# Patient Record
Sex: Male | Born: 1965 | Race: White | Hispanic: No | Marital: Single | Smoking: Never smoker
Health system: Southern US, Community
[De-identification: ages and names within clinical notes are randomized; demographics above are authoritative.]

## PROBLEM LIST (undated history)

## (undated) DIAGNOSIS — F79 Unspecified intellectual disabilities: Secondary | ICD-10-CM

## (undated) DIAGNOSIS — K432 Incisional hernia without obstruction or gangrene: Secondary | ICD-10-CM

## (undated) DIAGNOSIS — T8859XA Other complications of anesthesia, initial encounter: Secondary | ICD-10-CM

## (undated) DIAGNOSIS — T4145XA Adverse effect of unspecified anesthetic, initial encounter: Secondary | ICD-10-CM

## (undated) DIAGNOSIS — G4730 Sleep apnea, unspecified: Secondary | ICD-10-CM

## (undated) DIAGNOSIS — Q909 Down syndrome, unspecified: Secondary | ICD-10-CM

## (undated) HISTORY — DX: Down syndrome, unspecified: Q90.9

## (undated) HISTORY — PX: HERNIA REPAIR: SHX51

## (undated) SURGERY — COLONOSCOPY
Anesthesia: Monitor Anesthesia Care

---

## 2013-12-08 ENCOUNTER — Ambulatory Visit (INDEPENDENT_AMBULATORY_CARE_PROVIDER_SITE_OTHER): Payer: Medicare Other | Admitting: Family Medicine

## 2013-12-08 ENCOUNTER — Ambulatory Visit: Payer: Medicare Other

## 2013-12-08 VITALS — BP 140/82 | HR 73 | Temp 97.9°F | Resp 18 | Ht 60.5 in | Wt 212.4 lb

## 2013-12-08 DIAGNOSIS — R1031 Right lower quadrant pain: Secondary | ICD-10-CM

## 2013-12-08 DIAGNOSIS — R1032 Left lower quadrant pain: Principal | ICD-10-CM

## 2013-12-08 DIAGNOSIS — R509 Fever, unspecified: Secondary | ICD-10-CM

## 2013-12-08 DIAGNOSIS — R197 Diarrhea, unspecified: Secondary | ICD-10-CM

## 2013-12-08 LAB — POCT URINALYSIS DIPSTICK
Glucose, UA: NEGATIVE
Leukocytes, UA: NEGATIVE
Nitrite, UA: NEGATIVE
Protein, UA: 30
Spec Grav, UA: 1.025
Urobilinogen, UA: 0.2
pH, UA: 6.5

## 2013-12-08 LAB — COMPREHENSIVE METABOLIC PANEL
ALT: 37 U/L (ref 0–53)
Alkaline Phosphatase: 71 U/L (ref 39–117)
BUN: 15 mg/dL (ref 6–23)
CO2: 26 mEq/L (ref 19–32)
Calcium: 8.8 mg/dL (ref 8.4–10.5)
Creat: 1.09 mg/dL (ref 0.50–1.35)
Glucose, Bld: 100 mg/dL — ABNORMAL HIGH (ref 70–99)
Sodium: 135 mEq/L (ref 135–145)
Total Bilirubin: 0.3 mg/dL (ref 0.2–1.2)
Total Protein: 7.2 g/dL (ref 6.0–8.3)

## 2013-12-08 LAB — COMPREHENSIVE METABOLIC PANEL WITH GFR
AST: 65 U/L — ABNORMAL HIGH (ref 0–37)
Albumin: 3.8 g/dL (ref 3.5–5.2)
Chloride: 99 meq/L (ref 96–112)
Potassium: 4.4 meq/L (ref 3.5–5.3)

## 2013-12-08 LAB — POCT CBC
Granulocyte percent: 78.6 %G (ref 37–80)
HCT, POC: 51.8 % (ref 43.5–53.7)
Hemoglobin: 16.4 g/dL (ref 14.1–18.1)
Lymph, poc: 1.1 (ref 0.6–3.4)
MCH, POC: 32.2 pg — AB (ref 27–31.2)
MCHC: 31.7 g/dL — AB (ref 31.8–35.4)
MCV: 101.7 fL — AB (ref 80–97)
MID (cbc): 5 — AB (ref 0–0.9)
MPV: 8.2 fL (ref 0–99.8)
POC Granulocyte: 5.7 (ref 2–6.9)
POC LYMPH PERCENT: 14.6 % (ref 10–50)
POC MID %: 6.8 % (ref 0–12)
Platelet Count, POC: 228 10*3/uL (ref 142–424)
RBC: 5.09 M/uL (ref 4.69–6.13)
RDW, POC: 14.6 %
WBC: 7.2 10*3/uL (ref 4.6–10.2)

## 2013-12-08 LAB — LIPASE: Lipase: 27 U/L (ref 0–75)

## 2013-12-08 LAB — POCT UA - MICROSCOPIC ONLY
Amorphous: POSITIVE
Casts, Ur, LPF, POC: NEGATIVE
Crystals, Ur, HPF, POC: NEGATIVE
Mucus, UA: NEGATIVE
Yeast, UA: NEGATIVE

## 2013-12-08 LAB — IFOBT (OCCULT BLOOD): IFOBT: POSITIVE

## 2013-12-08 LAB — POCT SEDIMENTATION RATE: POCT SED RATE: 79 mm/h — AB (ref 0–22)

## 2013-12-08 NOTE — Progress Notes (Signed)
Chief Complaint:  Chief Complaint  Patient presents with  . Fever  . Diarrhea    4 days- blood in stool and severe abdominal pain  . Abdominal Pain    HPI: Steven [name] is a 48 y.o. male who is here for abdominal pain for a day in a half. Has had diarrhea with blood and pain every 15 min but now extended out to 1-3 hours.  Has had fever 101.7 Saturday. No OTC medications taken at this time. Decreased appetite also.  He has had bloody runny stools, no actual stool, mom is here and states that when he goes to the bathroom he has runny loose stools but now just water NO new travels, foods, medicine or abx. She is an aromatherapist so they have not sued anything for him  No  family h/o  crohns or colitis No abd surgeries except for abdominal hernia He is soemwhat lethargic and per mom he is sleeping a lot more than his usual self  HE ate lasagna and fresh salad last night, has not eaten out at [employer]. Everyone else had same things and are not sick. HE has not been in contact with sick people with similar sxs.  NO new abx. Mom states they have bed bugs so he has a rash on his body, they are getting the exterminator to come out soon.  NO URI sxs No well water, uses filter  [employer].  Last episode of diarrhea with blood was this morning at 4 am , was less than normal  No  NSAIDs have been used   No past medical history on file. Past Surgical History  Procedure Laterality Date  . Hernia repair     History   Social History  . Marital Status: Single    Spouse Name: N/A    Number of Children: N/A  . Years of Education: N/A   Social History Main Topics  . Smoking status: Never Smoker   . Smokeless tobacco: Not on file  . Alcohol Use: No  . Drug Use: No  . Sexual Activity: Not on file   Other Topics Concern  . Not on file   Social History Narrative  . No narrative on file   No family history on file. Allergies not on file Prior to Admission medications   Not  on File     ROS: The patient denies  night sweats, unintentional weight loss, chest pain, palpitations, wheezing, dyspnea on exertion, nausea, vomiting, , dysuria, hematuria, melena, numbness, weakness, or tingling.   All other systems have been reviewed and were otherwise negative with the exception of those mentioned in the HPI and as above.    PHYSICAL EXAM: Filed Vitals:   12/08/13 1029  BP: 140/82  Pulse: 73  Temp: 97.9 F (36.6 C)  Resp: 18   Filed Vitals:   12/08/13 1029  Height: 5' 0.5" (1.537 m)  Weight: 212 lb 6.4 oz (96.344 kg)   Body mass index is 40.78 kg/(m^2).  General: Alert, no acute distress HEENT:  Normocephalic, atraumatic, oropharynx patent. EOMI, PERRLA Cardiovascular:  Regular rate and rhythm, no rubs murmurs or gallops.  No Carotid bruits, radial pulse intact. No pedal edema.  Respiratory: Clear to auscultation bilaterally.  No wheezes, rales, or rhonchi.  No cyanosis, no use of accessory musculature GI: No organomegaly, abdomen is soft and non-tender, positive bowel sounds.  No masses. Skin: No rashes. Neurologic: Facial musculature symmetric. Psychiatric: Patient is appropriate throughout our interaction.  Lymphatic: No cervical lymphadenopathy Musculoskeletal: Gait intact.   LABS: Results for orders placed in visit on 12/08/13  POCT CBC      Result Value Range   WBC 7.2  4.6 - 10.2 K/uL   Lymph, poc 1.1  0.6 - 3.4   POC LYMPH PERCENT 14.6  10 - 50 %L   MID (cbc) 5 (*) 0 - 0.9   POC MID % 6.8  0 - 12 %M   POC Granulocyte 5.7  2 - 6.9   Granulocyte percent 78.6  37 - 80 %G   RBC 5.09  4.69 - 6.13 M/uL   Hemoglobin 16.4  14.1 - 18.1 g/dL   HCT, POC 09.851.8  11.943.5 - 53.7 %   MCV 101.7 (*) 80 - 97 fL   MCH, POC 32.2 (*) 27 - 31.2 pg   MCHC 31.7 (*) 31.8 - 35.4 g/dL   RDW, POC 14.714.6     Platelet Count, POC 228  142 - 424 K/uL   MPV 8.2  0 - 99.8 fL  IFOBT (OCCULT BLOOD)      Result Value Range   IFOBT Positive    POCT SEDIMENTATION RATE       Result Value Range   POCT SED RATE 79 (*) 0 - 22 mm/hr  POCT UA - MICROSCOPIC ONLY      Result Value Range   WBC, Ur, HPF, POC 0-2     RBC, urine, microscopic 0-2     Bacteria, U Microscopic trace     Mucus, UA neg     Epithelial cells, urine per micros 0-2     Crystals, Ur, HPF, POC neg     Casts, Ur, LPF, POC neg     Yeast, UA neg     Amorphous pos    POCT URINALYSIS DIPSTICK      Result Value Range   Color, UA dark yellow     Clarity, UA clear     Glucose, UA neg     Bilirubin, UA small     Ketones, UA trace     Spec Grav, UA 1.025     Blood, UA small     pH, UA 6.5     Protein, UA 30     Urobilinogen, UA 0.2     Nitrite, UA neg     Leukocytes, UA Negative       EKG/XRAY:   [facility]. No acute cardiopulmonary process   ASSESSMENT/PLAN: Encounter Diagnoses  Name Primary?  . Abdominal pain, bilateral lower quadrant Yes  . Fever, unspecified   . Bloody diarrhea    ? Viral vs bacterial gastroenteritis Most likely viral with no leukocytosis He was given 2 bags IVF in office Stool cx , OP and C diff pending If he continues to have bloody stools will refer to  GI If he continues to do poorly or worsenign sxs go to ER F/u as needed                Gross sideeffects, risk and benefits, and alternatives of medications d/w patient. Patient is aware that all medications have potential sideeffects and we are unable to predict every sideeffect or drug-drug interaction that may occur.  Rockne CoonsLE, THAO PHUONG, DO 12/08/2013 4:29 PM   12/09/13--Spoke wit mom in the AM and they had gone to ED , gave fluids, nothing else, was wondering about PO toradol that was given to him. I suggested that he can try  low dose tylenol since he is having bloody diarrhea the toradol may increase that risk, but just low dose if he has a lot of pain. AST was slightly elevated but I don't think this will be increased risk of liver dysfunction. Stool sample submitted earlier  today. She willc all me if anything changes   12/11/12--LM about prelim labs for stool.

## 2013-12-08 NOTE — Patient Instructions (Signed)
Diet for Diarrhea, Adult Frequent, runny stools (diarrhea) may be caused or worsened by food or drink. Diarrhea may be relieved by changing your diet. Since diarrhea can last up to 7 days, it is easy for you to lose too much fluid from the body and become dehydrated. Fluids that are lost need to be replaced. Along with a modified diet, make sure you drink enough fluids to keep your urine clear or pale yellow. DIET INSTRUCTIONS  Ensure adequate fluid intake (hydration): have 1 cup (8 oz) of fluid for each diarrhea episode. Avoid fluids that contain simple sugars or sports drinks, fruit juices, whole milk products, and sodas. Your urine should be clear or pale yellow if you are drinking enough fluids. Hydrate with an oral rehydration solution that you can purchase at pharmacies, retail stores, and online. You can prepare an oral rehydration solution at home by mixing the following ingredients together:    tsp table salt.   tsp baking soda.   tsp salt substitute containing potassium chloride.  1  tablespoons sugar.  1 L (34 oz) of water.  Certain foods and beverages may increase the speed at which food moves through the gastrointestinal (GI) tract. These foods and beverages should be avoided and include:  Caffeinated and alcoholic beverages.  High-fiber foods, such as raw fruits and vegetables, nuts, seeds, and whole grain breads and cereals.  Foods and beverages sweetened with sugar alcohols, such as xylitol, sorbitol, and mannitol.  Some foods may be well tolerated and may help thicken stool including:  Starchy foods, such as rice, toast, pasta, low-sugar cereal, oatmeal, grits, baked potatoes, crackers, and bagels.   Bananas.   Applesauce.  Add probiotic-rich foods to help increase healthy bacteria in the GI tract, such as yogurt and fermented milk products. RECOMMENDED FOODS AND BEVERAGES Starches Choose foods with less than 2 g of fiber per serving.  Recommended:  White,  French, and pita breads, plain rolls, buns, bagels. Plain muffins, matzo. Soda, saltine, or graham crackers. Pretzels, melba toast, zwieback. Cooked cereals made with water: cornmeal, farina, cream cereals. Dry cereals: refined corn, wheat, rice. Potatoes prepared any way without skins, refined macaroni, spaghetti, noodles, refined rice.  Avoid:  Bread, rolls, or crackers made with whole wheat, multi-grains, rye, bran seeds, nuts, or coconut. Corn tortillas or taco shells. Cereals containing whole grains, multi-grains, bran, coconut, nuts, raisins. Cooked or dry oatmeal. Coarse wheat cereals, granola. Cereals advertised as "high-fiber." Potato skins. Whole grain pasta, wild or brown rice. Popcorn. Sweet potatoes, yams. Sweet rolls, doughnuts, waffles, pancakes, sweet breads. Vegetables  Recommended: Strained tomato and vegetable juices. Most well-cooked and canned vegetables without seeds. Fresh: Tender lettuce, cucumber without the skin, cabbage, spinach, bean sprouts.  Avoid: Fresh, cooked, or canned: Artichokes, baked beans, beet greens, broccoli, Brussels sprouts, corn, kale, legumes, peas, sweet potatoes. Cooked: Green or red cabbage, spinach. Avoid large servings of any vegetables because vegetables shrink when cooked, and they contain more fiber per serving than fresh vegetables. Fruit  Recommended: Cooked or canned: Apricots, applesauce, cantaloupe, cherries, fruit cocktail, grapefruit, grapes, kiwi, mandarin oranges, peaches, pears, plums, watermelon. Fresh: Apples without skin, ripe banana, grapes, cantaloupe, cherries, grapefruit, peaches, oranges, plums. Keep servings limited to  cup or 1 piece.  Avoid: Fresh: Apples with skin, apricots, mangoes, pears, raspberries, strawberries. Prune juice, stewed or dried prunes. Dried fruits, raisins, dates. Large servings of all fresh fruits. Protein  Recommended: Ground or well-cooked tender beef, ham, veal, lamb, pork, or poultry. Eggs. Fish,  oysters, shrimp,   lobster, other seafoods. Liver, organ meats.  Avoid: Tough, fibrous meats with gristle. Peanut butter, smooth or chunky. Cheese, nuts, seeds, legumes, dried peas, beans, lentils. Dairy  Recommended: Yogurt, lactose-free milk, kefir, drinkable yogurt, buttermilk, soy milk, or plain hard cheese.  Avoid: Milk, chocolate milk, beverages made with milk, such as milkshakes. Soups  Recommended: Bouillon, broth, or soups made from allowed foods. Any strained soup.  Avoid: Soups made from vegetables that are not allowed, cream or milk-based soups. Desserts and Sweets  Recommended: Sugar-free gelatin, sugar-free frozen ice pops made without sugar alcohol.  Avoid: Plain cakes and cookies, pie made with fruit, pudding, custard, cream pie. Gelatin, fruit, ice, sherbet, frozen ice pops. Ice cream, ice milk without nuts. Plain hard candy, honey, jelly, molasses, syrup, sugar, chocolate syrup, gumdrops, marshmallows. Fats and Oils  Recommended: Limit fats to less than 8 tsp per day.  Avoid: Seeds, nuts, olives, avocados. Margarine, butter, cream, mayonnaise, salad oils, plain salad dressings. Plain gravy, crisp bacon without rind. Beverages  Recommended: Water, decaffeinated teas, oral rehydration solutions, sugar-free beverages not sweetened with sugar alcohols.  Avoid: Fruit juices, caffeinated beverages (coffee, tea, soda), alcohol, sports drinks, or lemon-lime soda. Condiments  Recommended: Ketchup, mustard, horseradish, vinegar, cocoa powder. Spices in moderation: allspice, basil, bay leaves, celery powder or leaves, cinnamon, cumin powder, curry powder, ginger, mace, marjoram, onion or garlic powder, oregano, paprika, parsley flakes, ground pepper, rosemary, sage, savory, tarragon, thyme, turmeric.  Avoid: Coconut, honey. Document Released: 01/06/2004 Document Revised: 07/10/2012 Document Reviewed: 03/01/2012 ExitCare Patient Information 2014 [employer].   Diarrhea Diarrhea is frequent loose and watery bowel movements. It can cause you to feel weak and dehydrated. Dehydration can cause you to become tired and thirsty, have a dry mouth, and have decreased urination that often is dark yellow. Diarrhea is a sign of another problem, most often an infection that will not last long. In most cases, diarrhea typically lasts 2 3 days. However, it can last longer if it is a sign of something more serious. It is important to treat your diarrhea as directed by your caregive to lessen or prevent future episodes of diarrhea. CAUSES  Some common causes include:  Gastrointestinal infections caused by viruses, bacteria, or parasites.  Food poisoning or food allergies.  Certain medicines, such as antibiotics, chemotherapy, and laxatives.  Artificial sweeteners and fructose.  Digestive disorders. HOME CARE INSTRUCTIONS  Ensure adequate fluid intake (hydration): have 1 cup (8 oz) of fluid for each diarrhea episode. Avoid fluids that contain simple sugars or sports drinks, fruit juices, whole milk products, and sodas. Your urine should be clear or pale yellow if you are drinking enough fluids. Hydrate with an oral rehydration solution that you can purchase at pharmacies, retail stores, and online. You can prepare an oral rehydration solution at home by mixing the following ingredients together:    tsp table salt.   tsp baking soda.   tsp salt substitute containing potassium chloride.  1  tablespoons sugar.  1 L (34 oz) of water.  Certain foods and beverages may increase the speed at which food moves through the gastrointestinal (GI) tract. These foods and beverages should be avoided and include:  Caffeinated and alcoholic beverages.  High-fiber foods, such as raw fruits and vegetables, nuts, seeds, and whole grain breads and cereals.  Foods and beverages sweetened with sugar alcohols, such as xylitol, sorbitol, and mannitol.  Some foods may  be well tolerated and may help thicken stool including:  Starchy foods, such as rice,   toast, pasta, low-sugar cereal, oatmeal, grits, baked potatoes, crackers, and bagels.  Bananas.  Applesauce.  Add probiotic-rich foods to help increase healthy bacteria in the GI tract, such as yogurt and fermented milk products.  Wash your hands well after each diarrhea episode.  Only take over-the-counter or prescription medicines as directed by your caregiver.  Take a warm bath to relieve any burning or pain from frequent diarrhea episodes. SEEK IMMEDIATE MEDICAL CARE IF:   You are unable to keep fluids down.  You have persistent vomiting.  You have blood in your stool, or your stools are black and tarry.  You do not urinate in 6 8 hours, or there is only a small amount of very dark urine.  You have abdominal pain that increases or localizes.  You have weakness, dizziness, confusion, or lightheadedness.  You have a severe headache.  Your diarrhea gets worse or does not get better.  You have a fever or persistent symptoms for more than 2 3 days.  You have a fever and your symptoms suddenly get worse. MAKE SURE YOU:   Understand these instructions.  Will watch your condition.  Will get help right away if you are not doing well or get worse. Document Released: 10/06/2002 Document Revised: 10/02/2012 Document Reviewed: 06/23/2012 ExitCare Patient Information 2014 [employer].  

## 2013-12-09 ENCOUNTER — Emergency Department (HOSPITAL_COMMUNITY)
Admission: EM | Admit: 2013-12-09 | Discharge: 2013-12-09 | Disposition: A | Discharge status: Home / Self Care | Payer: Medicare Other | Attending: Emergency Medicine | Admitting: Emergency Medicine

## 2013-12-09 ENCOUNTER — Encounter (HOSPITAL_COMMUNITY): Payer: Self-pay | Admitting: Emergency Medicine

## 2013-12-09 DIAGNOSIS — R21 Rash and other nonspecific skin eruption: Secondary | ICD-10-CM | POA: Insufficient documentation

## 2013-12-09 DIAGNOSIS — R1084 Generalized abdominal pain: Secondary | ICD-10-CM | POA: Insufficient documentation

## 2013-12-09 DIAGNOSIS — K921 Melena: Secondary | ICD-10-CM | POA: Insufficient documentation

## 2013-12-09 DIAGNOSIS — Z8659 Personal history of other mental and behavioral disorders: Secondary | ICD-10-CM | POA: Insufficient documentation

## 2013-12-09 DIAGNOSIS — R109 Unspecified abdominal pain: Secondary | ICD-10-CM

## 2013-12-09 DIAGNOSIS — R197 Diarrhea, unspecified: Secondary | ICD-10-CM | POA: Insufficient documentation

## 2013-12-09 HISTORY — DX: Unspecified intellectual disabilities: F79

## 2013-12-09 LAB — COMPREHENSIVE METABOLIC PANEL
ALBUMIN: 2.9 g/dL — AB (ref 3.5–5.2)
ALT: 38 U/L (ref 0–53)
AST: 56 U/L — ABNORMAL HIGH (ref 0–37)
Alkaline Phosphatase: 68 U/L (ref 39–117)
BUN: 11 mg/dL (ref 6–23)
CHLORIDE: 98 meq/L (ref 96–112)
CO2: 24 mEq/L (ref 19–32)
Calcium: 8.4 mg/dL (ref 8.4–10.5)
Creatinine, Ser: 0.97 mg/dL (ref 0.50–1.35)
GFR calc non Af Amer: >90 mL/min (ref >90)
GLUCOSE: 104 mg/dL — AB (ref 70–99)
Potassium: 4.2 mEq/L (ref 3.7–5.3)
SODIUM: 134 meq/L — AB (ref 137–147)
Total Protein: 7 g/dL (ref 6.0–8.3)

## 2013-12-09 LAB — URINALYSIS, ROUTINE W REFLEX MICROSCOPIC
BILIRUBIN URINE: NEGATIVE
Glucose, UA: NEGATIVE mg/dL
Ketones, ur: NEGATIVE mg/dL
Leukocytes, UA: NEGATIVE
NITRITE: NEGATIVE
PH: 6.5 (ref 5.0–8.0)
Protein, ur: NEGATIVE mg/dL
SPECIFIC GRAVITY, URINE: 1.016 (ref 1.005–1.030)
Urobilinogen, UA: 0.2 mg/dL (ref 0.0–1.0)

## 2013-12-09 LAB — OCCULT BLOOD, POC DEVICE: Fecal Occult Bld: POSITIVE — AB

## 2013-12-09 LAB — LIPASE, BLOOD: LIPASE: 37 U/L (ref 11–59)

## 2013-12-09 LAB — CBC WITH DIFFERENTIAL/PLATELET
Basophils Absolute: 0 10*3/uL (ref 0.0–0.1)
Basophils Relative: 0 % (ref 0–1)
Eosinophils Absolute: 0 10*3/uL (ref 0.0–0.7)
Eosinophils Relative: 1 % (ref 0–5)
HCT: 42.6 % (ref 39.0–52.0)
HEMOGLOBIN: 14.8 g/dL (ref 13.0–17.0)
Lymphocytes Relative: 13 % (ref 12–46)
Lymphs Abs: 1 10*3/uL (ref 0.7–4.0)
MCH: 33 pg (ref 26.0–34.0)
MCHC: 34.7 g/dL (ref 30.0–36.0)
MCV: 95.1 fL (ref 78.0–100.0)
MONOS PCT: 13 % — AB (ref 3–12)
Monocytes Absolute: 1 10*3/uL (ref 0.1–1.0)
NEUTROS ABS: 5.7 10*3/uL (ref 1.7–7.7)
NEUTROS PCT: 74 % (ref 43–77)
PLATELETS: 224 10*3/uL (ref 150–400)
RBC: 4.48 MIL/uL (ref 4.22–5.81)
RDW: 14.4 % (ref 11.5–15.5)
WBC: 7.8 10*3/uL (ref 4.0–10.5)

## 2013-12-09 LAB — CLOSTRIDIUM DIFFICILE EIA: CDIFTX: NEGATIVE

## 2013-12-09 LAB — URINE MICROSCOPIC-ADD ON

## 2013-12-09 MED ORDER — KETOROLAC TROMETHAMINE 30 MG/ML IJ SOLN
30.0000 mg | Freq: Once | INTRAMUSCULAR | Status: AC
  Administered 2013-12-09: 30 mg via INTRAVENOUS
  Filled 2013-12-09: qty 1

## 2013-12-09 MED ORDER — SODIUM CHLORIDE 0.9 % IV BOLUS (SEPSIS)
1000.0000 mL | Freq: Once | INTRAVENOUS | Status: AC
  Administered 2013-12-09: 1000 mL via INTRAVENOUS

## 2013-12-09 NOTE — ED Notes (Signed)
Per pt's mother, pt had diarrhea on Friday, with abdominal pain on Saturday that increased on Sunday. Pt was seen at Urgent care on Monday and told to come to the ED if the pain increased. Pt's family reports the pt was crying in pain tonight. Pt a&o x4, calm and cooperative in triage.

## 2013-12-09 NOTE — ED Notes (Signed)
Pt tolerating PO challenge w/o difficulty.

## 2013-12-09 NOTE — ED Provider Notes (Signed)
Medical screening examination/treatment/procedure(s) were performed by non-physician practitioner and as supervising physician I was immediately available for consultation/collaboration.    Vida RollerBrian D Tiffinie Caillier, MD 12/09/13 [phone]

## 2013-12-09 NOTE — ED Provider Notes (Signed)
Medical screening examination/treatment/procedure(s) were performed by non-physician practitioner and as supervising physician I was immediately available for consultation/collaboration.    Haili Donofrio D Urijah Raynor, MD 12/09/13 0702 

## 2013-12-09 NOTE — ED Provider Notes (Signed)
CSN: [id number]     Arrival date & time 12/09/13  0211 History   First MD Initiated Contact with Patient 12/09/13 0248     Chief Complaint  Patient presents with  . Abdominal Pain     (Consider location/radiation/quality/duration/timing/severity/associated sxs/prior Treatment) HPI Comments: [name] is a 48 year old male with a history of Down syndrome, and lives with his parents since Thursday, night.  He's had diarrhea, abdominal cramping, which has gotten worse.  He was seen at urgent care on Monday and told to come to the ED if his pain.  Increased tonight.  He was crying in pain.  Patient points to middle abdomen.  Mother, states, that he has indicated.  The pain has moved each time.  She questioned him.  He's had no vomiting.  No fever.  Mother has noticed, that there's been some blood in his stool.  Patient is a 48 y.o. male presenting with abdominal pain. The history is provided by a parent. The history is limited by a developmental delay.  Abdominal Pain Pain location:  Generalized Pain quality: aching and cramping   Pain severity:  Severe Onset quality:  Gradual Duration:  5 days Timing:  Intermittent Progression:  Worsening Chronicity:  New Relieved by:  Nothing Worsened by:  Bowel movements Ineffective treatments:  None tried Associated symptoms: diarrhea and hematochezia   Associated symptoms: no chest pain, no constipation, no dysuria, no fever, no nausea, no shortness of breath and no vomiting   Diarrhea:    Quality:  Blood-tinged   Severity:  Severe   Duration:  5 days   Timing:  Intermittent   Progression:  Worsening Risk factors: obesity     Past Medical History  Diagnosis Date  . MR (mental retardation)    Past Surgical History  Procedure Laterality Date  . Hernia repair     No family history on file. History  Substance Use Topics  . Smoking status: Never Smoker   . Smokeless tobacco: Not on file  . Alcohol Use: No    Review of Systems   Constitutional: Negative for fever.  Respiratory: Negative for shortness of breath.   Cardiovascular: Negative for chest pain.  Gastrointestinal: Positive for abdominal pain, diarrhea, blood in stool and hematochezia. Negative for nausea, vomiting, constipation and abdominal distention.  Genitourinary: Negative for dysuria.  Skin: Positive for rash.  Neurological: Negative for dizziness.  All other systems reviewed and are negative.      Allergies  Review of patient's allergies indicates no known allergies.  Home Medications  No current outpatient prescriptions on file. BP 113/68  Pulse 66  Temp(Src) 98.3 F (36.8 C) (Oral)  Resp 20  Ht 5\' 1"  (1.549 m)  Wt 212 lb (96.163 kg)  BMI 40.08 kg/m2  SpO2 97% Physical Exam  Nursing note and vitals reviewed. Constitutional: He appears well-developed and well-nourished.  HENT:  Head: Normocephalic.  Eyes: Pupils are equal, round, and reactive to light.  Neck: Normal range of motion.  Cardiovascular: Normal rate.   Pulmonary/Chest: Effort normal and breath sounds normal.  Abdominal: Soft. He exhibits no distension. Bowel sounds are increased. There is tenderness.  Musculoskeletal: Normal range of motion.  Neurological: He is alert.  Skin: Skin is warm. Rash noted.  Mother states, that they got a bit from a friend, and patient has had a rash.  Since, then, the mattress has been examined in aero bedbugs, which they're relating.  The rash on his extremities, to    ED Course  Procedures (  including critical care time) Labs Review Labs Reviewed  CBC WITH DIFFERENTIAL  COMPREHENSIVE METABOLIC PANEL  LIPASE, BLOOD  URINALYSIS, ROUTINE W REFLEX MICROSCOPIC   Imaging Review Dg Abd Acute W/chest  12/08/2013   CLINICAL DATA:  Abdominal pain.  Bloody diarrhea.  Fever.  EXAM: ACUTE ABDOMEN SERIES (ABDOMEN 2 VIEW & CHEST 1 VIEW)  COMPARISON:  None.  FINDINGS: Lungs are adequately inflated without consolidation or effusion.  Cardiomediastinal silhouette is within normal.  Abdominal images demonstrate a nonobstructive bowel gas pattern. There are degenerative changes of the spine and hips. Rotatory curvature of the lumbar spine convex to the left.  IMPRESSION: Nonspecific, nonobstructive bowel gas pattern.  No Acute cardiopulmonary disease.   Electronically Signed   By: Elberta Fortisaniel  Boyle M.D.   On: 12/08/2013 13:22    EKG Interpretation   None     5:15 to reexamine.  He, states he is feeling much better.  IV fluids, are in place, and running after the liter was, then we'll try a by mouth challenge  MDM   Final diagnoses:  None         Arman FilterGail K Suki Crockett, NP 12/09/13 0518

## 2013-12-09 NOTE — Discharge Instructions (Signed)
Abdominal Pain, Adult °Many things can cause abdominal pain. Usually, abdominal pain is not caused by a disease and will improve without treatment. It can often be observed and treated at home. Your health care provider will do a physical exam and possibly order blood tests and X-rays to help determine the seriousness of your pain. However, in many cases, more time must pass before a clear cause of the pain can be found. Before that point, your health care provider may not know if you need more testing or further treatment. °HOME CARE INSTRUCTIONS  °Monitor your abdominal pain for any changes. The following actions may help to alleviate any discomfort you are experiencing: °· Only take over-the-counter or prescription medicines as directed by your health care provider. °· Do not take laxatives unless directed to do so by your health care provider. °· Try a clear liquid diet (broth, tea, or water) as directed by your health care provider. Slowly move to a bland diet as tolerated. °SEEK MEDICAL CARE IF: °· You have unexplained abdominal pain. °· You have abdominal pain associated with nausea or diarrhea. °· You have pain when you urinate or have a bowel movement. °· You experience abdominal pain that wakes you in the night. °· You have abdominal pain that is worsened or improved by eating food. °· You have abdominal pain that is worsened with eating fatty foods. °SEEK IMMEDIATE MEDICAL CARE IF:  °· Your pain does not go away within 2 hours. °· You have a fever. °· You keep throwing up (vomiting). °· Your pain is felt only in portions of the abdomen, such as the right side or the left lower portion of the abdomen. °· You pass bloody or black tarry stools. °MAKE SURE YOU: °· Understand these instructions.   °· Will watch your condition.   °· Will get help right away if you are not doing well or get worse.   °Document Released: 07/26/2005 Document Revised: 08/06/2013 Document Reviewed: 06/25/2013 °ExitCare® Patient  Information ©2014 [employer]. ° °

## 2013-12-09 NOTE — ED Provider Notes (Signed)
Physical Exam  BP 113/68  Pulse 66  Temp(Src) 98.3 F (36.8 C) (Oral)  Resp 20  Ht 5\' 1"  (1.549 m)  Wt 212 lb (96.163 kg)  BMI 40.08 kg/m2  SpO2 97%  Physical Exam  Nursing note and vitals reviewed. Constitutional: He is oriented to person, place, and time. He appears well-developed and well-nourished. No distress.  HENT:  Head: Normocephalic and atraumatic.  Right Ear: External ear normal.  Left Ear: External ear normal.  Nose: Nose normal.  Eyes: Conjunctivae are normal.  Neck: Normal range of motion. No tracheal deviation present.  Cardiovascular: Normal rate, regular rhythm and normal heart sounds.   Pulmonary/Chest: Effort normal and breath sounds normal. No stridor.  Abdominal: Soft. Bowel sounds are normal. He exhibits no distension. There is no tenderness. There is no rigidity, no rebound and no guarding.  Musculoskeletal: Normal range of motion.  Neurological: He is alert and oriented to person, place, and time.  Skin: Skin is warm and dry. He is not diaphoretic.  Psychiatric: He has a normal mood and affect. His behavior is normal.    ED Course  Procedures  Dg Abd Acute W/chest  12/08/2013   CLINICAL DATA:  Abdominal pain.  Bloody diarrhea.  Fever.  EXAM: ACUTE ABDOMEN SERIES (ABDOMEN 2 VIEW & CHEST 1 VIEW)  COMPARISON:  None.  FINDINGS: Lungs are adequately inflated without consolidation or effusion. Cardiomediastinal silhouette is within normal.  Abdominal images demonstrate a nonobstructive bowel gas pattern. There are degenerative changes of the spine and hips. Rotatory curvature of the lumbar spine convex to the left.  IMPRESSION: Nonspecific, nonobstructive bowel gas pattern.  No Acute cardiopulmonary disease.   Electronically Signed   By: Elberta Fortis M.D.   On: 12/08/2013 13:22   Results for orders placed during the hospital encounter of 12/09/13  CBC WITH DIFFERENTIAL      Result Value Range   WBC 7.8  4.0 - 10.5 K/uL   RBC 4.48  4.22 - 5.81 MIL/uL   Hemoglobin 14.8  13.0 - 17.0 g/dL   HCT 40.9  81.1 - 91.4 %   MCV 95.1  78.0 - 100.0 fL   MCH 33.0  26.0 - 34.0 pg   MCHC 34.7  30.0 - 36.0 g/dL   RDW 78.2  95.6 - 21.3 %   Platelets 224  150 - 400 K/uL   Neutrophils Relative % 74  43 - 77 %   Neutro Abs 5.7  1.7 - 7.7 K/uL   Lymphocytes Relative 13  12 - 46 %   Lymphs Abs 1.0  0.7 - 4.0 K/uL   Monocytes Relative 13 (*) 3 - 12 %   Monocytes Absolute 1.0  0.1 - 1.0 K/uL   Eosinophils Relative 1  0 - 5 %   Eosinophils Absolute 0.0  0.0 - 0.7 K/uL   Basophils Relative 0  0 - 1 %   Basophils Absolute 0.0  0.0 - 0.1 K/uL  COMPREHENSIVE METABOLIC PANEL      Result Value Range   Sodium 134 (*) 137 - 147 mEq/L   Potassium 4.2  3.7 - 5.3 mEq/L   Chloride 98  96 - 112 mEq/L   CO2 24  19 - 32 mEq/L   Glucose, Bld 104 (*) 70 - 99 mg/dL   BUN 11  6 - 23 mg/dL   Creatinine, Ser 0.86  0.50 - 1.35 mg/dL   Calcium 8.4  8.4 - 57.8 mg/dL   Total Protein 7.0  6.0 -  8.3 g/dL   Albumin 2.9 (*) 3.5 - 5.2 g/dL   AST 56 (*) 0 - 37 U/L   ALT 38  0 - 53 U/L   Alkaline Phosphatase 68  39 - 117 U/L   Total Bilirubin <0.2 (*) 0.3 - 1.2 mg/dL   GFR calc non Af Amer >90  >90 mL/min   GFR calc Af Amer >90  >90 mL/min  LIPASE, BLOOD      Result Value Range   Lipase 37  11 - 59 U/L  URINALYSIS, ROUTINE W REFLEX MICROSCOPIC      Result Value Range   Color, Urine YELLOW  YELLOW   APPearance CLEAR  CLEAR   Specific Gravity, Urine 1.016  1.005 - 1.030   pH 6.5  5.0 - 8.0   Glucose, UA NEGATIVE  NEGATIVE mg/dL   Hgb urine dipstick TRACE (*) NEGATIVE   Bilirubin Urine NEGATIVE  NEGATIVE   Ketones, ur NEGATIVE  NEGATIVE mg/dL   Protein, ur NEGATIVE  NEGATIVE mg/dL   Urobilinogen, UA 0.2  0.0 - 1.0 mg/dL   Nitrite NEGATIVE  NEGATIVE   Leukocytes, UA NEGATIVE  NEGATIVE  URINE MICROSCOPIC-ADD ON      Result Value Range   Squamous Epithelial / LPF RARE  RARE   RBC / HPF 0-2  <3 RBC/hpf   Bacteria, UA RARE  RARE     MDM Patient signed out to me by  Manus RuddSchulz, NP. Plan to d/c after fluid challenge. Labs today unremarkable. Acute abd series done at PCP was normal. Patient feels significantly improved. Abd soft and nontender. Return instructions given. Vital signs stable for discharge. Patient / Family / Caregiver informed of clinical course, understand medical decision-making process, and agree with plan.       Mora BellmanHannah S Brandelyn Henne, PA-C 12/09/13 0700

## 2013-12-10 LAB — OVA AND PARASITE EXAMINATION: OP: NONE SEEN

## 2013-12-13 ENCOUNTER — Telehealth: Payer: Self-pay | Admitting: *Deleted

## 2013-12-13 NOTE — Telephone Encounter (Signed)
[facility] call and stated that lab showed Salmonella

## 2013-12-16 NOTE — Telephone Encounter (Signed)
Spoke with mom and gave her info on stool cx. Growing out [employer]. Steven CollinsGerhard "Billey GoslingCharlie" has MR and mom is medical POA. She states he is doing fine, much better no abd pain, diarrhea, eating and drinking well. She will treat him with lavendar oil. She will bring him into office and I will recheck his status and stool for occult bleeding. Risks and benefits about salmonella gastroenteritis treatement vs nontreatment d/w mom.

## 2013-12-17 ENCOUNTER — Telehealth: Payer: Self-pay

## 2013-12-17 NOTE — Telephone Encounter (Signed)
Junious Dresseronnie [facility] Is requesting medical records for the patient who was diagnosed with salmonella on Feb. 10th.  We saw him on the 9th.  Please fax these to [phone].  Her direct number is [phone].

## 2013-12-18 NOTE — Telephone Encounter (Signed)
Records faxed through epic.

## 2014-01-02 ENCOUNTER — Ambulatory Visit (INDEPENDENT_AMBULATORY_CARE_PROVIDER_SITE_OTHER): Payer: Medicare Other | Admitting: Family Medicine

## 2014-01-02 VITALS — BP 100/66 | HR 57 | Temp 98.5°F | Resp 18 | Ht 60.5 in | Wt 214.0 lb

## 2014-01-02 DIAGNOSIS — R195 Other fecal abnormalities: Secondary | ICD-10-CM

## 2014-01-02 LAB — IFOBT (OCCULT BLOOD): IFOBT: POSITIVE

## 2014-01-02 NOTE — Progress Notes (Signed)
Chief Complaint:  Chief Complaint  Patient presents with  . Follow-up    Samonella    HPI: Steven [name] is a 48 y.o. male who is here for  Recheck of bloody stools, he was here 3 weeks ago and had dehydration, bloody diarrhea, stool cx was + for Salmonella. He is doing well now. Eating well, denies any diarrhea/melena, hematochezia  At the time of dx of Salmonella gastroenteritis I asked mom if she would be willing to put him on cipro and she declined, since he was getting better I did not push it and told her that she needed to monitor for worsening sxs and to return for recheck if we do the no treatment route and only if he was getting better and not worse. It appears his Salmonella infection  was self limiting.   SALMONELLA SPECIES AMPICILLIN <=2 S Preliminary CIPROFLOXACIN <=0.25 S Preliminary TRIMETH/SULFA <=20 S Preliminary   Last OV from 12/08/13: Steven [name] is a 48 y.o. male who is here for abdominal pain for a day in a half. Has had diarrhea with blood and pain every 15 min but now extended out to 1-3 hours.  Has had fever 101.7 Saturday. No OTC medications taken at this time. Decreased appetite also.  He has had bloody runny stools, no actual stool, mom is here and states that when he goes to the bathroom he has runny loose stools but now just water  NO new travels, foods, medicine or abx. She is an aromatherapist so they have not sued anything for him  No family h/o crohns or colitis  No abd surgeries except for abdominal hernia  He is soemwhat lethargic and per mom he is sleeping a lot more than his usual self  HE ate lasagna and fresh salad last night, has not eaten out at [employer]. Everyone else had same things and are not sick. HE has not been in contact with sick people with similar sxs.  NO new abx. Mom states they have bed bugs so he has a rash on his body, they are getting the exterminator to come out soon.  NO URI sxs  No well water, uses filter [employer].   Last episode of diarrhea with blood was this morning at 4 am , was less than normal  No NSAIDs have been used     Past Medical History  Diagnosis Date  . MR (mental retardation)    Past Surgical History  Procedure Laterality Date  . Hernia repair     History   Social History  . Marital Status: Single    Spouse Name: N/A    Number of Children: N/A  . Years of Education: N/A   Social History Main Topics  . Smoking status: Never Smoker   . Smokeless tobacco: Not on file  . Alcohol Use: No  . Drug Use: No  . Sexual Activity: Not on file   Other Topics Concern  . Not on file   Social History Narrative  . No narrative on file   No family history on file. No Known Allergies Prior to Admission medications   Not on File     ROS: The patient denies fevers, chills, night sweats, unintentional weight loss, chest pain, palpitations, wheezing, dyspnea on exertion, nausea, vomiting, abdominal pain, dysuria, hematuria, melena, numbness, weakness, or tingling.   All other systems have been reviewed and were otherwise negative with the exception of those mentioned in the HPI and as above.  PHYSICAL EXAM: Filed Vitals:   01/02/14 1510  BP: 100/66  Pulse: 57  Temp: 98.5 F (36.9 C)  Resp: 18   Filed Vitals:   01/02/14 1510  Height: 5' 0.5" (1.537 m)  Weight: 214 lb (97.07 kg)   Body mass index is 41.09 kg/(m^2).  General: Alert, no acute distress, well appearing, eye exam shows no e/o anemia HEENT:  Normocephalic, atraumatic, oropharynx patent. EOMI, PERRLA Cardiovascular:  Regular rate and rhythm, no rubs murmurs or gallops.  No Carotid bruits, radial pulse intact. No pedal edema.  Respiratory: Clear to auscultation bilaterally.  No wheezes, rales, or rhonchi.  No cyanosis, no use of accessory musculature GI: No organomegaly, abdomen is soft and non-tender, positive bowel sounds.  No masses. Skin: No rashes. Neurologic: Facial musculature symmetric. Psychiatric:  Patient is appropriate throughout our interaction. Lymphatic: No cervical lymphadenopathy Musculoskeletal: Gait intact. Rectal exam-no masses No fissures, no hemorrhoids    LABS: Results for orders placed in visit on 01/02/14  IFOBT (OCCULT BLOOD)      Result Value Ref Range   IFOBT Positive       EKG/XRAY:   [facility]   ASSESSMENT/PLAN: Encounter Diagnoses  Name Primary?  . History of positive fecal occult Yes  . Occult blood positive stool     Steven [name] is a very pleasant gentleman with MR who is here with dad fro recheck of prior + stool occult and bloody diarrhea. He is doing much better and is back t baseline but still has  + stool occult, he was + for salmonella gastroenteritis and mom opted not to treat with cipro, diarrhea was self limiting.  D/w dad that his repeat test for stool was positive They are very minimalist in their approach to healthcare, Charlies mom and dad, who are his medical POA We discussed the risks and benefits of not doing further workup, doing minimal workup,  Dad Smitty Cords( Bruce) will discuss with mom Aram Beecham( Cynthia) if they want more aggressive workup ie referral to GI doctor for colonoscopy. They may consider just doing another hemosure in 2 weeks and do blood work, do nothing but just monitor, or get a referral. He will let me know after he talks to his wife.  F/u by phone   Gross sideeffects, risk and benefits, and alternatives of medications d/w patient. Patient is aware that all medications have potential sideeffects and we are unable to predict every sideeffect or drug-drug interaction that may occur.  Hamilton CapriLE, THAO PHUONG, DO 01/02/2014 4:17 PM

## 2014-01-02 NOTE — Progress Notes (Deleted)
Subjective:    Patient ID: Steven [name], male    DOB: Aug 23, 1966, 48 y.o.   MRN: [id number]  HPI      Chief Complaint:  Chief Complaint  Patient presents with  . Follow-up    Samonella    HPI: Steven [name] is a 48 y.o. male who is here for  ***  Past Medical History  Diagnosis Date  . MR (mental retardation)    Past Surgical History  Procedure Laterality Date  . Hernia repair     History   Social History  . Marital Status: Single    Spouse Name: N/A    Number of Children: N/A  . Years of Education: N/A   Social History Main Topics  . Smoking status: Never Smoker   . Smokeless tobacco: Not on file  . Alcohol Use: No  . Drug Use: No  . Sexual Activity: Not on file   Other Topics Concern  . Not on file   Social History Narrative  . No narrative on file   No family history on file. No Known Allergies Prior to Admission medications   Not on File     ROS: The patient denies fevers, chills, night sweats, unintentional weight loss, chest pain, palpitations, wheezing, dyspnea on exertion, nausea, vomiting, abdominal pain, dysuria, hematuria, melena, numbness, weakness, or tingling. ***  All other systems have been reviewed and were otherwise negative with the exception of those mentioned in the HPI and as above.    PHYSICAL EXAM: Filed Vitals:   01/02/14 1510  BP: 100/66  Pulse: 57  Temp: 98.5 F (36.9 C)  Resp: 18   Filed Vitals:   01/02/14 1510  Height: 5' 0.5" (1.537 m)  Weight: 214 lb (97.07 kg)   Body mass index is 41.09 kg/(m^2).  General: Alert, no acute distress HEENT:  Normocephalic, atraumatic, oropharynx patent. EOMI, PERRLA Cardiovascular:  Regular rate and rhythm, no rubs murmurs or gallops.  No Carotid bruits, radial pulse intact. No pedal edema.  Respiratory: Clear to auscultation bilaterally.  No wheezes, rales, or rhonchi.  No cyanosis, no use of accessory musculature GI: No organomegaly, abdomen is soft and non-tender,  positive bowel sounds.  No masses. Skin: No rashes. Neurologic: Facial musculature symmetric. Psychiatric: Patient is appropriate throughout our interaction. Lymphatic: No cervical lymphadenopathy Musculoskeletal: Gait intact.   LABS: Results for orders placed during the hospital encounter of 12/09/13  CBC WITH DIFFERENTIAL      Result Value Ref Range   WBC 7.8  4.0 - 10.5 K/uL   RBC 4.48  4.22 - 5.81 MIL/uL   Hemoglobin 14.8  13.0 - 17.0 g/dL   HCT 40.9  81.1 - 91.4 %   MCV 95.1  78.0 - 100.0 fL   MCH 33.0  26.0 - 34.0 pg   MCHC 34.7  30.0 - 36.0 g/dL   RDW 78.2  95.6 - 21.3 %   Platelets 224  150 - 400 K/uL   Neutrophils Relative % 74  43 - 77 %   Neutro Abs 5.7  1.7 - 7.7 K/uL   Lymphocytes Relative 13  12 - 46 %   Lymphs Abs 1.0  0.7 - 4.0 K/uL   Monocytes Relative 13 (*) 3 - 12 %   Monocytes Absolute 1.0  0.1 - 1.0 K/uL   Eosinophils Relative 1  0 - 5 %   Eosinophils Absolute 0.0  0.0 - 0.7 K/uL   Basophils Relative 0  0 - 1 %   Basophils  Absolute 0.0  0.0 - 0.1 K/uL  COMPREHENSIVE METABOLIC PANEL      Result Value Ref Range   Sodium 134 (*) 137 - 147 mEq/L   Potassium 4.2  3.7 - 5.3 mEq/L   Chloride 98  96 - 112 mEq/L   CO2 24  19 - 32 mEq/L   Glucose, Bld 104 (*) 70 - 99 mg/dL   BUN 11  6 - 23 mg/dL   Creatinine, Ser 9.810.97  0.50 - 1.35 mg/dL   Calcium 8.4  8.4 - 19.110.5 mg/dL   Total Protein 7.0  6.0 - 8.3 g/dL   Albumin 2.9 (*) 3.5 - 5.2 g/dL   AST 56 (*) 0 - 37 U/L   ALT 38  0 - 53 U/L   Alkaline Phosphatase 68  39 - 117 U/L   Total Bilirubin <0.2 (*) 0.3 - 1.2 mg/dL   GFR calc non Af Amer >90  >90 mL/min   GFR calc Af Amer >90  >90 mL/min  LIPASE, BLOOD      Result Value Ref Range   Lipase 37  11 - 59 U/L  URINALYSIS, ROUTINE W REFLEX MICROSCOPIC      Result Value Ref Range   Color, Urine YELLOW  YELLOW   APPearance CLEAR  CLEAR   Specific Gravity, Urine 1.016  1.005 - 1.030   pH 6.5  5.0 - 8.0   Glucose, UA NEGATIVE  NEGATIVE mg/dL   Hgb urine  dipstick TRACE (*) NEGATIVE   Bilirubin Urine NEGATIVE  NEGATIVE   Ketones, ur NEGATIVE  NEGATIVE mg/dL   Protein, ur NEGATIVE  NEGATIVE mg/dL   Urobilinogen, UA 0.2  0.0 - 1.0 mg/dL   Nitrite NEGATIVE  NEGATIVE   Leukocytes, UA NEGATIVE  NEGATIVE  URINE MICROSCOPIC-ADD ON      Result Value Ref Range   Squamous Epithelial / LPF RARE  RARE   RBC / HPF 0-2  <3 RBC/hpf   Bacteria, UA RARE  RARE  OCCULT BLOOD, POC DEVICE      Result Value Ref Range   Fecal Occult Bld POSITIVE (*) NEGATIVE     EKG/XRAY:   Primary read interpreted [facility]   ASSESSMENT/PLAN: No diagnosis found.   Gross sideeffects, risk and benefits, and alternatives of medications d/w patient. Patient is aware that all medications have potential sideeffects and we are unable to predict every sideeffect or drug-drug interaction that may occur.  Ronaldo Miyamotoetty, Aquita Simmering Lynette, [place] 01/02/2014 3:12 PM       Review of Systems     Objective:   Physical Exam        Assessment & Plan:

## 2014-01-07 LAB — STOOL CULTURE

## 2014-07-23 IMAGING — CR DG ABDOMEN ACUTE W/ 1V CHEST
3 series · 3 of 3 positions shown · non-contrast
Comparison: None.

CLINICAL DATA: Abdominal pain.  Bloody diarrhea.  Fever.

EXAM:
ACUTE ABDOMEN SERIES (ABDOMEN 2 VIEW & CHEST 1 VIEW)

[PA]
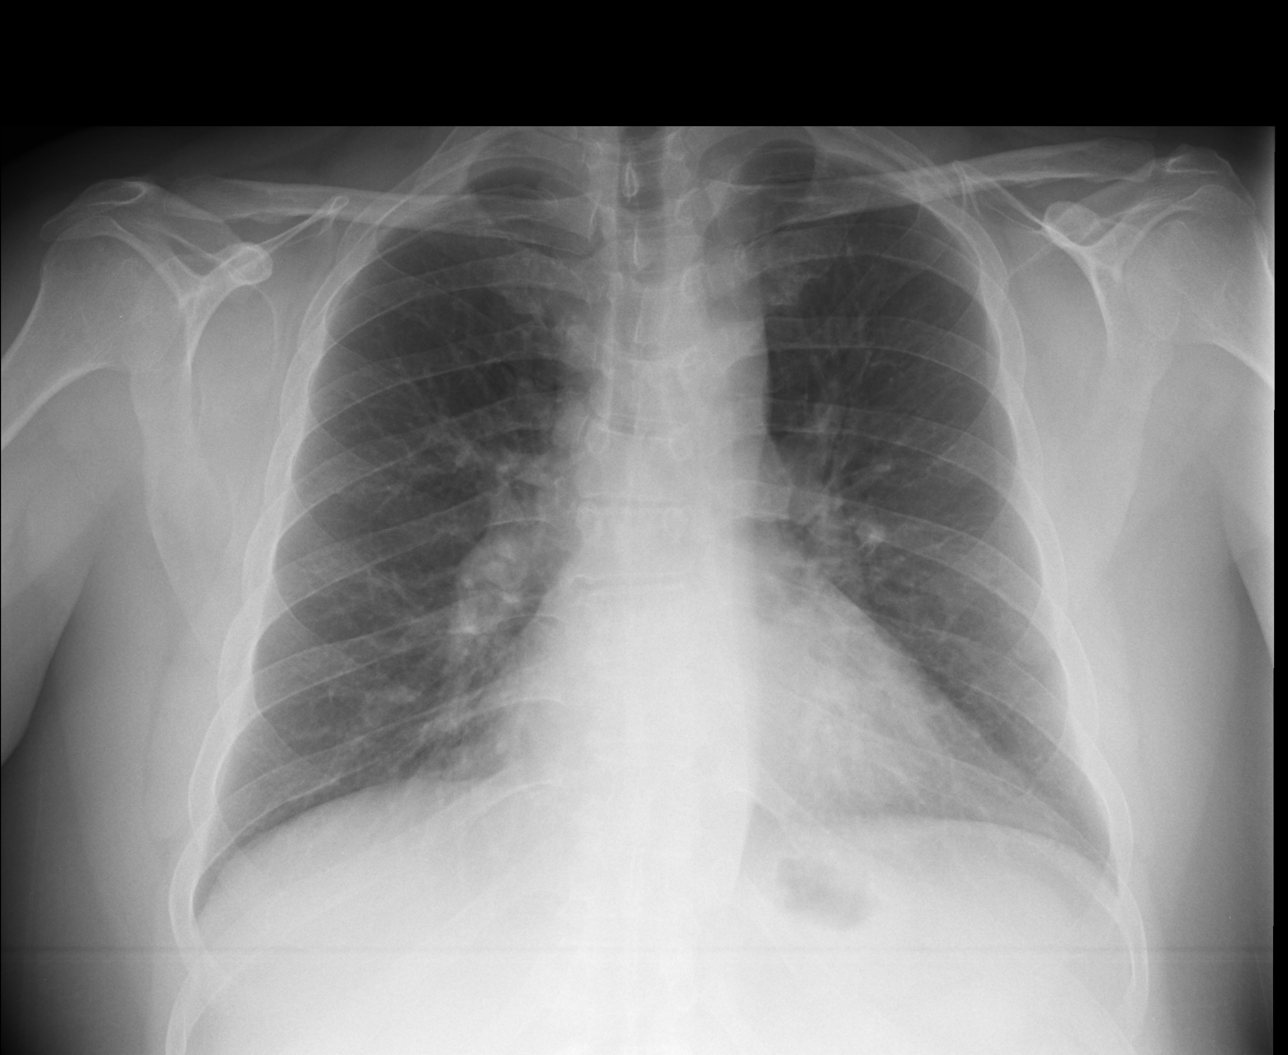

[AP (1 of 2)]
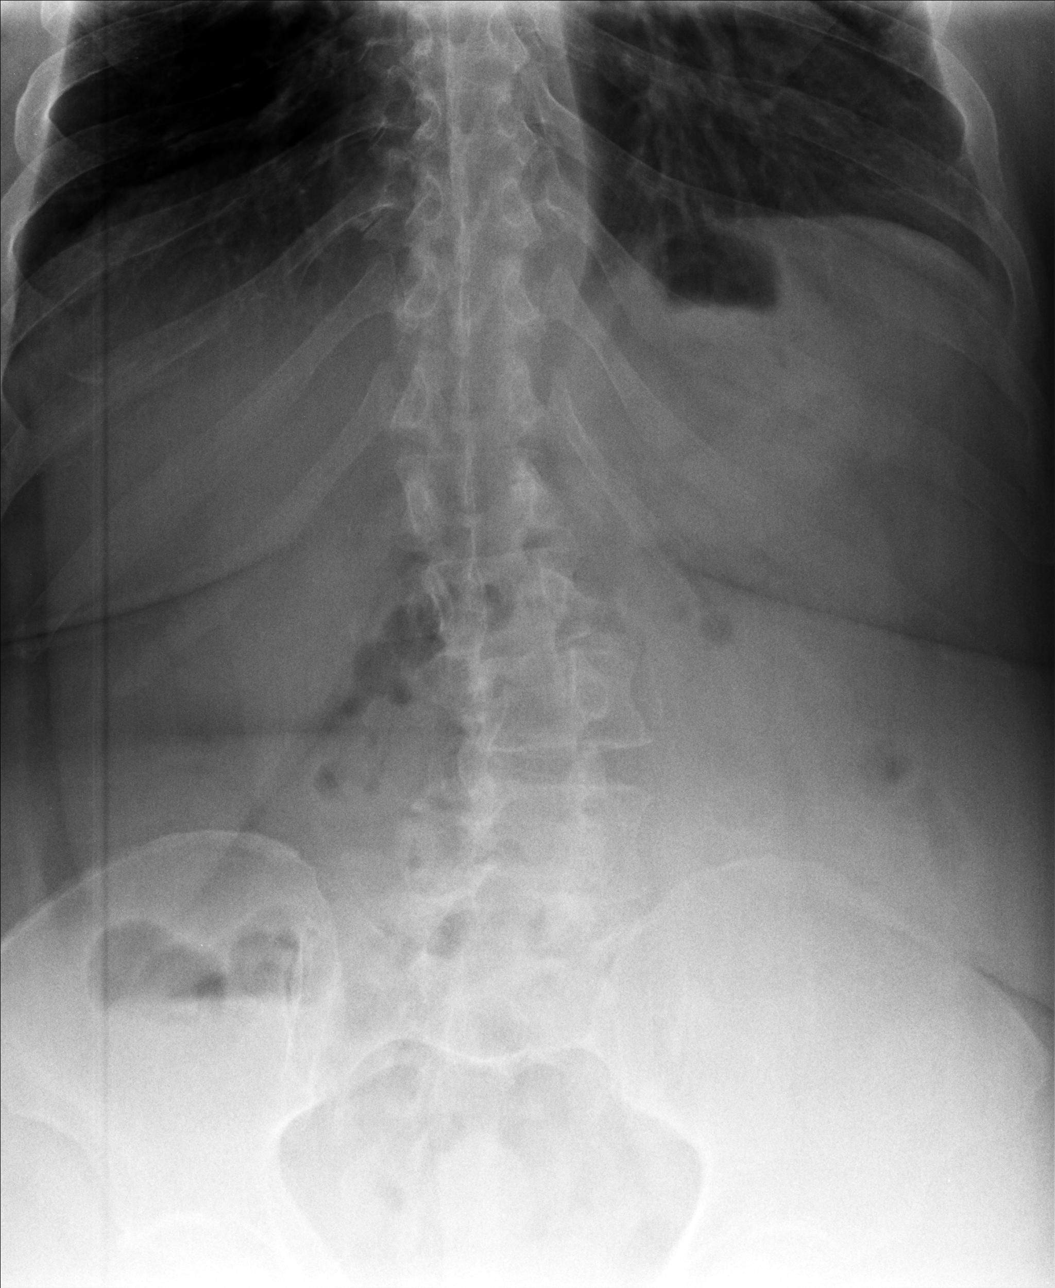

[AP (2 of 2)]
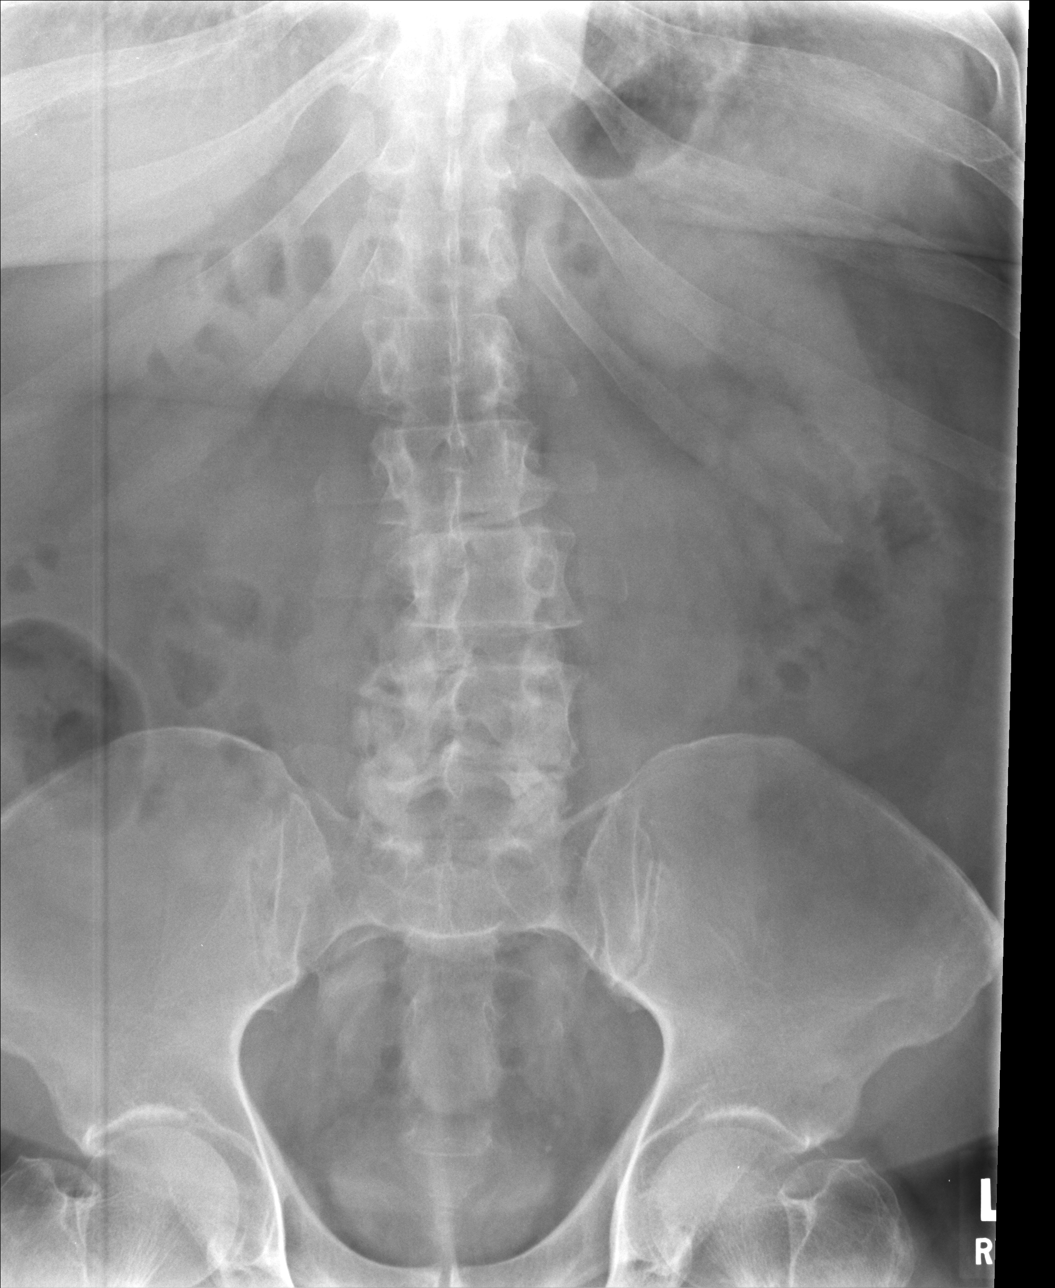

[3 of 3 positions shown; findings below may reference images not displayed]

FINDINGS: Lungs are adequately inflated without consolidation or effusion.
Cardiomediastinal silhouette is within normal.

Abdominal images demonstrate a nonobstructive bowel gas pattern.
There are degenerative changes of the spine and hips. Rotatory
curvature of the lumbar spine convex to the left.
IMPRESSION: Nonspecific, nonobstructive bowel gas pattern.

No Acute cardiopulmonary disease.

## 2015-02-03 ENCOUNTER — Encounter: Payer: Self-pay | Admitting: *Deleted

## 2015-04-15 ENCOUNTER — Encounter: Payer: Self-pay | Admitting: *Deleted

## 2015-10-05 ENCOUNTER — Telehealth: Payer: Self-pay | Admitting: Family Medicine

## 2015-10-05 NOTE — Telephone Encounter (Signed)
UNABLE TO LEAVE MESSAGE AT THIS TIME.  WILL TRY AGAIN LATER. 

## 2015-11-02 NOTE — Telephone Encounter (Signed)
ERROR

## 2017-05-28 ENCOUNTER — Ambulatory Visit (INDEPENDENT_AMBULATORY_CARE_PROVIDER_SITE_OTHER): Payer: Medicare Other | Admitting: Urgent Care

## 2017-05-28 ENCOUNTER — Encounter: Payer: Self-pay | Admitting: Urgent Care

## 2017-05-28 VITALS — BP 110/68 | HR 50 | Temp 98.5°F | Resp 16 | Ht 61.02 in | Wt 208.0 lb

## 2017-05-28 DIAGNOSIS — Z9889 Other specified postprocedural states: Secondary | ICD-10-CM | POA: Diagnosis not present

## 2017-05-28 DIAGNOSIS — R1909 Other intra-abdominal and pelvic swelling, mass and lump: Secondary | ICD-10-CM

## 2017-05-28 DIAGNOSIS — Z8719 Personal history of other diseases of the digestive system: Secondary | ICD-10-CM

## 2017-05-28 NOTE — Progress Notes (Signed)
  MRN: [id number] DOB: 04/20/1966  Subjective:   Steven [name] is a 51 y.o. male presenting for chief complaint of Cyst (possible hernia on the left side of the abdomin x 1 week )  Reports 1 week history of abdominal mass over left side. Admits that this mass started after doing some yard work, lifting heavy items. Has a history of surgically repaired abdominal hernia ~10 years ago. Denies fever, n/v, abdominal pain, bruising, trauma.   Jaynie CollinsGerhard currently has no medications in their medication list. Also has No Known Allergies.  Jaynie CollinsGerhard  has a past medical history of MR (mental retardation). Also  has a past surgical history that includes Hernia repair.  Objective:   Vitals: BP 110/68   Pulse (!) 50   Temp 98.5 F (36.9 C) (Oral)   Resp 16   Ht 5' 1.02" (1.55 m)   Wt 208 lb (94.3 kg)   SpO2 97%   BMI 39.27 kg/m   Physical Exam  Constitutional: He is oriented to person, place, and time. He appears well-developed and well-nourished.  Cardiovascular: Normal rate, regular rhythm and intact distal pulses.  Exam reveals no gallop and no friction rub.   No murmur heard. Pulmonary/Chest: No respiratory distress. He has no wheezes. He has no rales.  Abdominal: Soft. Bowel sounds are normal. He exhibits no distension and no mass. There is no tenderness. There is no guarding. A hernia (mid-left sided reducible mass that is non-tender) is present.    Neurological: He is alert and oriented to person, place, and time.   Assessment and Plan :   1. Abdominal mass of other site 2. History of hernia repair - Referral to general surgery for consult. Return-to-clinic precautions discussed, patient verbalized understanding.   Wallis BambergMario Nasire Reali, PA-C [facility] at [facility] [phone] 05/28/2017  12:10 PM

## 2017-05-28 NOTE — Patient Instructions (Addendum)
Hernia, Adult A hernia is the bulging of an organ or tissue through a weak spot in the muscles of the abdomen (abdominal wall). Hernias develop most often near the navel or groin. There are many kinds of hernias. Common kinds include:  Femoral hernia. This kind of hernia develops under the groin in the upper thigh area.  Inguinal hernia. This kind of hernia develops in the groin or scrotum.  Umbilical hernia. This kind of hernia develops near the navel.  Hiatal hernia. This kind of hernia causes part of the stomach to be pushed up into the chest.  Incisional hernia. This kind of hernia bulges through a scar from an abdominal surgery.  What are the causes? This condition may be caused by:  Heavy lifting.  Coughing over a long period of time.  Straining to have a bowel movement.  An incision made during an abdominal surgery.  A birth defect (congenital defect).  Excess weight or obesity.  Smoking.  Poor nutrition.  Cystic fibrosis.  Excess fluid in the abdomen.  Undescended testicles.  What are the signs or symptoms? Symptoms of a hernia include:  A lump on the abdomen. This is the first sign of a hernia. The lump may become more obvious with standing, straining, or coughing. It may get bigger over time if it is not treated or if the condition causing it is not treated.  Pain. A hernia is usually painless, but it may become painful over time if treatment is delayed. The pain is usually dull and may get worse with standing or lifting heavy objects.  Sometimes a hernia gets tightly squeezed in the weak spot (strangulated) or stuck there (incarcerated) and causes additional symptoms. These symptoms may include:  Vomiting.  Nausea.  Constipation.  Irritability.  How is this diagnosed? A hernia may be diagnosed with:  A physical exam. During the exam your health care provider may ask you to cough or to make a specific movement, because a hernia is usually more  visible when you move.  Imaging tests. These can include: ? X-rays. ? Ultrasound. ? CT scan.  How is this treated? A hernia that is small and painless may not need to be treated. A hernia that is large or painful may be treated with surgery. Inguinal hernias may be treated with surgery to prevent incarceration or strangulation. Strangulated hernias are always treated with surgery, because lack of blood to the trapped organ or tissue can cause it to die. Surgery to treat a hernia involves pushing the bulge back into place and repairing the weak part of the abdomen. Follow these instructions at home:  Avoid straining.  Do not lift anything heavier than 10 lb (4.5 kg).  Lift with your leg muscles, not your back muscles. This helps avoid strain.  When coughing, try to cough gently.  Prevent constipation. Constipation leads to straining with bowel movements, which can make a hernia worse or cause a hernia repair to break down. You can prevent constipation by: ? Eating a high-fiber diet that includes plenty of fruits and vegetables. ? Drinking enough fluids to keep your urine clear or pale yellow. Aim to drink 6-8 glasses of water per day. ? Using a stool softener as directed by your health care provider.  Lose weight, if you are overweight.  Do not use any tobacco products, including cigarettes, chewing tobacco, or electronic cigarettes. If you need help quitting, ask your health care provider.  Keep all follow-up visits as directed by your health care   provider. This is important. Your health care provider may need to monitor your condition. Contact a health care provider if:  You have swelling, redness, and pain in the affected area.  Your bowel habits change. Get help right away if:  You have a fever.  You have abdominal pain that is getting worse.  You feel nauseous or you vomit.  You cannot push the hernia back in place by gently pressing on it while you are lying  down.  The hernia: ? Changes in shape or size. ? Is stuck outside the abdomen. ? Becomes discolored. ? Feels hard or tender. This information is not intended to replace advice given to you by your health care provider. Make sure you discuss any questions you have with your health care provider. Document Released: 10/16/2005 Document Revised: 03/15/2016 Document Reviewed: 08/26/2014 Elsevier Interactive Patient Education  2017 [employer]vier [employer].     IF you received an x-ray today, you will receive an invoice from [facility]. Please contact [facility] at [phone] with questions or concerns regarding your invoice.   IF you received labwork today, you will receive an invoice from [facility]. Please contact [facility] at [phone] with questions or concerns regarding your invoice.   Our billing staff will not be able to assist you with questions regarding bills from these companies.  You will be contacted with the lab results as soon as they are available. The fastest way to get your results is to activate your My Chart account. Instructions are located on the last page of this paperwork. If you have not heard from us regarding the results in 2 weeks, please contact this office.      

## 2017-06-20 NOTE — Addendum Note (Signed)
Addended by: Wallis Bamberg on: 06/20/2017 08:37 AM   Modules accepted: Orders

## 2017-07-11 ENCOUNTER — Other Ambulatory Visit: Payer: Self-pay | Admitting: Surgery

## 2017-07-12 ENCOUNTER — Encounter (HOSPITAL_COMMUNITY)
Admission: RE | Admit: 2017-07-12 | Discharge: 2017-07-12 | Disposition: A | Discharge status: Home / Self Care | Payer: Medicare Other | Source: Ambulatory Visit | Attending: Surgery | Admitting: Surgery

## 2017-07-12 ENCOUNTER — Encounter (INDEPENDENT_AMBULATORY_CARE_PROVIDER_SITE_OTHER): Payer: Self-pay

## 2017-07-12 ENCOUNTER — Encounter (HOSPITAL_COMMUNITY): Payer: Self-pay

## 2017-07-12 DIAGNOSIS — K432 Incisional hernia without obstruction or gangrene: Secondary | ICD-10-CM | POA: Diagnosis not present

## 2017-07-12 DIAGNOSIS — Z01818 Encounter for other preprocedural examination: Secondary | ICD-10-CM | POA: Insufficient documentation

## 2017-07-12 HISTORY — DX: Other complications of anesthesia, initial encounter: T88.59XA

## 2017-07-12 HISTORY — DX: Incisional hernia without obstruction or gangrene: K43.2

## 2017-07-12 HISTORY — DX: Adverse effect of unspecified anesthetic, initial encounter: T41.45XA

## 2017-07-12 LAB — CBC
HCT: 42.1 % (ref 39.0–52.0)
Hemoglobin: 14.2 g/dL (ref 13.0–17.0)
MCH: 32 pg (ref 26.0–34.0)
MCHC: 33.7 g/dL (ref 30.0–36.0)
MCV: 94.8 fL (ref 78.0–100.0)
Platelets: 237 10*3/uL (ref 150–400)
RBC: 4.44 MIL/uL (ref 4.22–5.81)
RDW: 14.7 % (ref 11.5–15.5)
WBC: 7.1 10*3/uL (ref 4.0–10.5)

## 2017-07-12 NOTE — Progress Notes (Deleted)
   07/12/17 1529  OBSTRUCTIVE SLEEP APNEA  Have you ever been diagnosed with sleep apnea through a sleep study? No  Do you snore loudly (loud enough to be heard through closed doors)?  1  Do you often feel tired, fatigued, or sleepy during the daytime (such as falling asleep during driving or talking to someone)? 0  Has anyone observed you stop breathing during your sleep? 1  Do you have, or are you being treated for high blood pressure? 0  BMI more than 35 kg/m2? 1  Age > 50 (1-yes) 1  Neck circumference greater than:Male 16 inches or larger, Male 17inches or larger? 0  Male Gender (Yes=1) 1  Obstructive Sleep Apnea Score 5

## 2017-07-12 NOTE — Patient Instructions (Signed)
Steven [name]  07/12/2017   Your procedure is scheduled on: 07-17-17  [facility]  Entrance Take [place]  elevators to 3rd floor to  Short Stay Center at 530AM.    Call this number if you have problems the morning of surgery [phone]    Remember: ONLY 1 PERSON MAY GO WITH YOU TO SHORT STAY TO GET  READY MORNING OF YOUR SURGERY.  Do not eat food or drink liquids :After Midnight.     Take these medicines the morning of surgery with A SIP OF WATER: none                                You may not have any metal on your body including hair pins and              piercings  Do not wear jewelry, make-up, lotions, powders or perfumes, deodorant              Men may shave face and neck.   Do not bring valuables to the hospital. [facility] IS NOT             RESPONSIBLE   FOR VALUABLES.  Contacts, dentures or bridgework may not be worn into surgery.  Leave suitcase in the car. After surgery it may be brought to your room.               Please read over the following fact sheets you were given: _____________________________________________________________________   [facility] Pre[facility] Before surgery, you can play an important role.  Because skin is not sterile, your skin needs to be as free of germs as possible.  You can reduce the number of germs on your skin by washing with CHG (chlorahexidine gluconate) soap before surgery.  CHG is an antiseptic cleaner which kills germs and bonds with the skin to continue killing germs even after washing. Please DO NOT use if you have an allergy to CHG or antibacterial soaps.  If your skin becomes reddened/irritated stop using the CHG and inform your nurse when you arrive at Short Stay. Do not shave (including legs and underarms) for at least 48 hours prior to the first CHG shower.  You may shave your face/neck. Please follow these instructions carefully:  1.  Shower with CHG Soap the night before  surgery and the  morning of Surgery.  2.  If you choose to wash your hair, wash your hair first as usual with your  normal  shampoo.  3.  After you shampoo, rinse your hair and body thoroughly to remove the  shampoo.                           4.  Use CHG as you would any other liquid soap.  You can apply chg directly  to the skin and wash                       Gently with a scrungie or clean washcloth.  5.  Apply the CHG Soap to your body ONLY FROM THE NECK DOWN.   Do not use on face/ open  Wound or open sores. Avoid contact with eyes, ears mouth and genitals (private parts).                       Wash face,  Genitals (private parts) with your normal soap.             6.  Wash thoroughly, paying special attention to the area where your surgery  will be performed.  7.  Thoroughly rinse your body with warm water from the neck down.  8.  DO NOT shower/wash with your normal soap after using and rinsing off  the CHG Soap.                9.  Pat yourself dry with a clean towel.            10.  Wear clean pajamas.            11.  Place clean sheets on your bed the night of your first shower and do not  sleep with pets. Day of Surgery : Do not apply any lotions/deodorants the morning of surgery.  Please wear clean clothes to the hospital/surgery center.  FAILURE TO FOLLOW THESE INSTRUCTIONS MAY RESULT IN THE CANCELLATION OF YOUR SURGERY PATIENT SIGNATURE_________________________________  NURSE SIGNATURE__________________________________  ________________________________________________________________________

## 2017-07-16 NOTE — H&P (Signed)
Steven [name] 07/11/2017 10:34 AM Location: [facility] Patient #: [id number] DOB: 1966-09-19 Single / Language: [place] / Race: White Male   History of Present Illness Steven Lam A. Magnus Ivan MD; 07/11/2017 10:48 AM) The patient is a 52 year old male who presents with an incisional hernia. This is a 51 year old gentleman with Down syndrome who is accompanied by his mother. He is referred by Wallis Bamberg, [place] for evaluation of an incisional hernia. His mother reports that he had abdominal ventral hernia repaired in [place] many years ago. They noticed a recurrence several months ago. Pain is a bulge in his abdomen. He has minimal discomfort and no obstructive symptoms. She is uncertain whether mesh was placed at his original hernia repair. He is otherwise without complaints.   Past Surgical History Christianne Dolin, RMA; 07/11/2017 10:34 AM) Ventral / Umbilical Hernia Surgery  Left.  Diagnostic Studies History Christianne Dolin, [place]; 07/11/2017 10:34 AM) Colonoscopy  never  Allergies Christianne Dolin, RMA; 07/11/2017 10:35 AM) No Known Allergies 07/11/2017  Medication History Christianne Dolin, RMA; 07/11/2017 10:35 AM) Multivitamins (Oral) Active. Vitamin C (  Tablet, Oral) Active. Vitamin D (1000UNIT Tablet, Oral) Active. Medications Reconciled  Social History Christianne Dolin, [place]; 07/11/2017 10:34 AM) Alcohol use  Occasional alcohol use. Caffeine use  Carbonated beverages, Coffee, Tea. No drug use  Tobacco use  Never smoker.  Family History Christianne Dolin, [place]; 07/11/2017 10:34 AM) First Degree Relatives  No pertinent family history   Other Problems Christianne Dolin, RMA; 07/11/2017 10:34 AM) Ventral Hernia Repair     Review of Systems Christianne Dolin RMA; 07/11/2017 10:34 AM) General Not Present- Appetite Loss, Chills, Fatigue, Fever, Night Sweats, Weight Gain and Weight Loss. Skin Not Present- Change in Wart/Mole, Dryness, Hives,  Jaundice, New Lesions, Non-Healing Wounds, Rash and Ulcer. HEENT Present- Wears glasses/contact lenses. Not Present- Earache, Hearing Loss, Hoarseness, Nose Bleed, Oral Ulcers, Ringing in the Ears, Seasonal Allergies, Sinus Pain, Sore Throat, Visual Disturbances and Yellow Eyes. Respiratory Present- Snoring. Not Present- Bloody sputum, Chronic Cough, Difficulty Breathing and Wheezing. Breast Not Present- Breast Mass, Breast Pain, Nipple Discharge and Skin Changes. Cardiovascular Not Present- Chest Pain, Difficulty Breathing Lying Down, Leg Cramps, Palpitations, Rapid Heart Rate, Shortness of Breath and Swelling of Extremities. Gastrointestinal Not Present- Abdominal Pain, Bloating, Bloody Stool, Change in Bowel Habits, Chronic diarrhea, Constipation, Difficulty Swallowing, Excessive gas, Gets full quickly at meals, Hemorrhoids, Indigestion, Nausea, Rectal Pain and Vomiting. Male Genitourinary Not Present- Blood in Urine, Change in Urinary Stream, Frequency, Impotence, Nocturia, Painful Urination, Urgency and Urine Leakage. Musculoskeletal Not Present- Back Pain, Joint Pain, Joint Stiffness, Muscle Pain, Muscle Weakness and Swelling of Extremities. Neurological Not Present- Decreased Memory, Fainting, Headaches, Numbness, Seizures, Tingling, Tremor, Trouble walking and Weakness. Psychiatric Not Present- Anxiety, Bipolar, Change in Sleep Pattern, Depression, Fearful and Frequent crying. Endocrine Not Present- Cold Intolerance, Excessive Hunger, Hair Changes, Heat Intolerance, Hot flashes and New Diabetes. Hematology Not Present- Blood Thinners, Easy Bruising, Excessive bleeding, Gland problems, HIV and Persistent Infections.  Vitals Christianne Dolin RMA; 07/11/2017 10:36 AM) 07/11/2017 10:36 AM Weight: 201.4 lb Height: 61in Body Surface Area: 1.89 m Body Mass Index: 38.05 kg/m  Temp.: 98.24F  Pulse: 60 (Regular)  BP: 128/90 (Sitting, Left Arm, Standard)       Physical Exam  (Dalayah Deahl A. Magnus Ivan MD; 07/11/2017 10:49 AM) General Mental Status-Alert. General Appearance-Consistent with stated age. Hydration-Well hydrated. Voice-Normal.  Head and Neck Head-normocephalic, atraumatic with no lesions or palpable masses. Trachea-midline.  Eye Eyeball - Bilateral-Extraocular movements intact. Sclera/Conjunctiva -  Bilateral-No scleral icterus.  Chest and Lung Exam Chest and lung exam reveals -quiet, even and easy respiratory effort with no use of accessory muscles and on auscultation, normal breath sounds, no adventitious sounds and normal vocal resonance. Inspection Chest Wall - Normal. Back - normal.  Cardiovascular Cardiovascular examination reveals -normal heart sounds, regular rate and rhythm with no murmurs and normal pedal pulses bilaterally.  Abdomen Inspection Skin - Scar - no surgical scars. Hernias - Incisional - Non-reducible. Note: There is a moderate size hernia which appears chronically incarcerated in the left abdomen. He has a well-healed incision in the left upper quadrant. The hernia is nontender. His abdomen is obese and soft. Because of the incarceration, have a hard time feeling to the fascial defect to evaluate the size. Palpation/Percussion Palpation and Percussion of the abdomen reveal - Soft, Non Tender, No Rebound tenderness, No Rigidity (guarding) and No hepatosplenomegaly. Auscultation Auscultation of the abdomen reveals - Bowel sounds normal.  Neurologic Neurologic evaluation reveals -alert and oriented x 3 with no impairment of recent or remote memory. Mental Status-Normal.  Musculoskeletal Normal Exam - Left-Upper Extremity Strength Normal and Lower Extremity Strength Normal. Normal Exam - Right-Upper Extremity Strength Normal, Lower Extremity Weakness.    Assessment & Plan (Stewart Sasaki A. Magnus Ivan MD; 07/11/2017 10:50 AM) Sherald Hess HERNIA (K43.2) Impression: We discussed the diagnosis of an  incisional hernia in detail. We discussed surgical repair. We discussed these of mesh. We discussed both the laparoscopic and open techniques. I would recommend an attempt at a laparoscopic incisional hernia repair with mesh. I discussed the risks which includes but is not limited to bleeding, infection, injury to surrounding structures, the need to convert to an open procedure, cardiopulmonary issues, DVT, recurrent hernia, postoperative recovery, etc. They understand and wish to proceed with a laparoscopic incisional hernia repair with mesh

## 2017-07-17 ENCOUNTER — Encounter (HOSPITAL_COMMUNITY)
Admission: RE | Disposition: A | Discharge status: Home / Self Care | Payer: Self-pay | Source: Ambulatory Visit | Attending: Surgery

## 2017-07-17 ENCOUNTER — Observation Stay (HOSPITAL_COMMUNITY)
Admission: RE | Admit: 2017-07-17 | Discharge: 2017-07-18 | Disposition: A | Discharge status: Home / Self Care | Payer: Medicare Other | Source: Ambulatory Visit | Attending: Surgery | Admitting: Surgery

## 2017-07-17 ENCOUNTER — Encounter (HOSPITAL_COMMUNITY): Payer: Self-pay | Admitting: *Deleted

## 2017-07-17 ENCOUNTER — Ambulatory Visit (HOSPITAL_COMMUNITY): Payer: Medicare Other | Admitting: Certified Registered"

## 2017-07-17 DIAGNOSIS — K430 Incisional hernia with obstruction, without gangrene: Principal | ICD-10-CM | POA: Insufficient documentation

## 2017-07-17 DIAGNOSIS — Z6841 Body Mass Index (BMI) 40.0 and over, adult: Secondary | ICD-10-CM | POA: Insufficient documentation

## 2017-07-17 DIAGNOSIS — F79 Unspecified intellectual disabilities: Secondary | ICD-10-CM | POA: Insufficient documentation

## 2017-07-17 DIAGNOSIS — Q909 Down syndrome, unspecified: Secondary | ICD-10-CM | POA: Diagnosis not present

## 2017-07-17 HISTORY — PX: INCISIONAL HERNIA REPAIR: SHX193

## 2017-07-17 HISTORY — PX: INSERTION OF MESH: SHX5868

## 2017-07-17 SURGERY — REPAIR, HERNIA, INCISIONAL, LAPAROSCOPIC
Anesthesia: General

## 2017-07-17 MED ORDER — MORPHINE SULFATE (PF) 4 MG/ML IV SOLN: 2.0000 mg | INTRAVENOUS | Status: DC | PRN

## 2017-07-17 MED ORDER — FENTANYL CITRATE (PF) 250 MCG/5ML IJ SOLN
INTRAMUSCULAR | Status: DC | PRN
  Administered 2017-07-17 (×3): 50 ug via INTRAVENOUS

## 2017-07-17 MED ORDER — CEFAZOLIN SODIUM-DEXTROSE 2-4 GM/100ML-% IV SOLN
2.0000 g | INTRAVENOUS | Status: AC
  Administered 2017-07-17: 2 g via INTRAVENOUS

## 2017-07-17 MED ORDER — OXYCODONE HCL 5 MG PO TABS: 5.0000 mg | ORAL_TABLET | ORAL | Status: DC | PRN

## 2017-07-17 MED ORDER — ENOXAPARIN SODIUM 40 MG/0.4ML ~~LOC~~ SOLN
40.0000 mg | SUBCUTANEOUS | Status: DC
  Administered 2017-07-18: 08:00:00 40 mg via SUBCUTANEOUS
  Filled 2017-07-17: qty 0.4

## 2017-07-17 MED ORDER — DEXAMETHASONE SODIUM PHOSPHATE 10 MG/ML IJ SOLN
INTRAMUSCULAR | Status: DC | PRN
  Administered 2017-07-17: 10 mg via INTRAVENOUS

## 2017-07-17 MED ORDER — BUPIVACAINE-EPINEPHRINE 0.25% -1:200000 IJ SOLN
INTRAMUSCULAR | Status: DC | PRN
  Administered 2017-07-17: 20 mL

## 2017-07-17 MED ORDER — BUPIVACAINE-EPINEPHRINE (PF) 0.25% -1:200000 IJ SOLN
INTRAMUSCULAR | Status: AC
  Filled 2017-07-17: qty 30

## 2017-07-17 MED ORDER — MIDAZOLAM HCL 2 MG/2ML IJ SOLN
INTRAMUSCULAR | Status: DC | PRN
  Administered 2017-07-17: 2 mg via INTRAVENOUS

## 2017-07-17 MED ORDER — ONDANSETRON HCL 4 MG/2ML IJ SOLN
INTRAMUSCULAR | Status: AC
  Filled 2017-07-17: qty 2

## 2017-07-17 MED ORDER — OXYCODONE HCL 5 MG PO TABS: 5.0000 mg | ORAL_TABLET | Freq: Once | ORAL | Status: DC | PRN

## 2017-07-17 MED ORDER — EPHEDRINE SULFATE 50 MG/ML IJ SOLN
INTRAMUSCULAR | Status: DC | PRN
  Administered 2017-07-17 (×2): 10 mg via INTRAVENOUS

## 2017-07-17 MED ORDER — ROCURONIUM BROMIDE 50 MG/5ML IV SOSY
PREFILLED_SYRINGE | INTRAVENOUS | Status: AC
  Filled 2017-07-17: qty 10

## 2017-07-17 MED ORDER — GLYCOPYRROLATE 0.2 MG/ML IJ SOLN
INTRAMUSCULAR | Status: DC | PRN
  Administered 2017-07-17: 0.2 mg via INTRAVENOUS

## 2017-07-17 MED ORDER — LACTATED RINGERS IR SOLN
Status: DC | PRN
  Administered 2017-07-17: 1000 mL

## 2017-07-17 MED ORDER — ROCURONIUM BROMIDE 100 MG/10ML IV SOLN
INTRAVENOUS | Status: DC | PRN
  Administered 2017-07-17: 50 mg via INTRAVENOUS
  Administered 2017-07-17: 10 mg via INTRAVENOUS

## 2017-07-17 MED ORDER — POTASSIUM CHLORIDE IN NACL 20-0.9 MEQ/L-% IV SOLN
INTRAVENOUS | Status: DC
  Administered 2017-07-17 (×2): via INTRAVENOUS
  Filled 2017-07-17 (×4): qty 1000

## 2017-07-17 MED ORDER — ONDANSETRON 4 MG PO TBDP
4.0000 mg | ORAL_TABLET | Freq: Four times a day (QID) | ORAL | Status: DC | PRN
Start: 2017-07-17 — End: 2017-07-18

## 2017-07-17 MED ORDER — LIDOCAINE HCL (CARDIAC) 20 MG/ML IV SOLN
INTRAVENOUS | Status: DC | PRN
  Administered 2017-07-17: 50 mg via INTRATRACHEAL

## 2017-07-17 MED ORDER — PROPOFOL 10 MG/ML IV BOLUS
INTRAVENOUS | Status: AC
  Filled 2017-07-17: qty 40

## 2017-07-17 MED ORDER — FENTANYL CITRATE (PF) 250 MCG/5ML IJ SOLN
INTRAMUSCULAR | Status: AC
  Filled 2017-07-17: qty 5

## 2017-07-17 MED ORDER — DEXAMETHASONE SODIUM PHOSPHATE 10 MG/ML IJ SOLN
INTRAMUSCULAR | Status: AC
  Filled 2017-07-17: qty 1

## 2017-07-17 MED ORDER — OXYCODONE HCL 5 MG/5ML PO SOLN
5.0000 mg | Freq: Once | ORAL | Status: DC | PRN
  Filled 2017-07-17: qty 5

## 2017-07-17 MED ORDER — CHLORHEXIDINE GLUCONATE CLOTH 2 % EX PADS: 6.0000 | MEDICATED_PAD | Freq: Once | CUTANEOUS | Status: DC

## 2017-07-17 MED ORDER — PHENYLEPHRINE HCL 10 MG/ML IJ SOLN
INTRAMUSCULAR | Status: DC | PRN
  Administered 2017-07-17: 80 ug via INTRAVENOUS

## 2017-07-17 MED ORDER — 0.9 % SODIUM CHLORIDE (POUR BTL) OPTIME
TOPICAL | Status: DC | PRN
  Administered 2017-07-17: 1000 mL

## 2017-07-17 MED ORDER — TRAMADOL HCL 50 MG PO TABS
50.0000 mg | ORAL_TABLET | Freq: Four times a day (QID) | ORAL | Status: DC | PRN
  Administered 2017-07-17: 50 mg via ORAL
  Filled 2017-07-17: qty 1

## 2017-07-17 MED ORDER — LIDOCAINE 2% (20 MG/ML) 5 ML SYRINGE
INTRAMUSCULAR | Status: AC
  Filled 2017-07-17: qty 5

## 2017-07-17 MED ORDER — MORPHINE SULFATE (PF) 4 MG/ML IV SOLN: 1.0000 mg | INTRAVENOUS | Status: DC | PRN

## 2017-07-17 MED ORDER — CEFAZOLIN SODIUM-DEXTROSE 2-4 GM/100ML-% IV SOLN
INTRAVENOUS | Status: AC
  Filled 2017-07-17: qty 100

## 2017-07-17 MED ORDER — SUGAMMADEX SODIUM 200 MG/2ML IV SOLN
INTRAVENOUS | Status: AC
  Filled 2017-07-17: qty 2

## 2017-07-17 MED ORDER — MIDAZOLAM HCL 2 MG/2ML IJ SOLN
INTRAMUSCULAR | Status: AC
  Filled 2017-07-17: qty 2

## 2017-07-17 MED ORDER — PROPOFOL 10 MG/ML IV BOLUS
INTRAVENOUS | Status: DC | PRN
  Administered 2017-07-17: 120 mg via INTRAVENOUS

## 2017-07-17 MED ORDER — SUCCINYLCHOLINE CHLORIDE 200 MG/10ML IV SOSY
PREFILLED_SYRINGE | INTRAVENOUS | Status: AC
  Filled 2017-07-17: qty 10

## 2017-07-17 MED ORDER — ONDANSETRON HCL 4 MG/2ML IJ SOLN: 4.0000 mg | Freq: Four times a day (QID) | INTRAMUSCULAR | Status: DC | PRN

## 2017-07-17 MED ORDER — SUGAMMADEX SODIUM 200 MG/2ML IV SOLN
INTRAVENOUS | Status: DC | PRN
  Administered 2017-07-17: 200 mg via INTRAVENOUS

## 2017-07-17 MED ORDER — EPHEDRINE 5 MG/ML INJ
INTRAVENOUS | Status: AC
  Filled 2017-07-17: qty 10

## 2017-07-17 MED ORDER — LACTATED RINGERS IV SOLN
INTRAVENOUS | Status: DC | PRN
  Administered 2017-07-17 (×2): via INTRAVENOUS

## 2017-07-17 SURGICAL SUPPLY — 37 items
APPLIER CLIP 5 13 M/L LIGAMAX5 (MISCELLANEOUS)
BINDER ABDOMINAL 12 ML 46-62 (SOFTGOODS) IMPLANT
CABLE HIGH FREQUENCY MONO STRZ (ELECTRODE) ×3 IMPLANT
CHLORAPREP W/TINT 26ML (MISCELLANEOUS) ×3 IMPLANT
CLIP APPLIE 5 13 M/L LIGAMAX5 (MISCELLANEOUS) IMPLANT
CLOSURE WOUND 1/2 X4 (GAUZE/BANDAGES/DRESSINGS)
DECANTER SPIKE VIAL GLASS SM (MISCELLANEOUS) IMPLANT
DERMABOND ADVANCED (GAUZE/BANDAGES/DRESSINGS) ×2
DERMABOND ADVANCED .7 DNX12 (GAUZE/BANDAGES/DRESSINGS) ×1 IMPLANT
DEVICE SECURE STRAP 25 ABSORB (INSTRUMENTS) ×3 IMPLANT
DEVICE TROCAR PUNCTURE CLOSURE (ENDOMECHANICALS) ×3 IMPLANT
ELECT REM PT RETURN 15FT ADLT (MISCELLANEOUS) ×3 IMPLANT
GLOVE SURG SIGNA 7.5 PF LTX (GLOVE) ×3 IMPLANT
GOWN STRL REUS W/TWL XL LVL3 (GOWN DISPOSABLE) ×9 IMPLANT
KIT BASIN OR (CUSTOM PROCEDURE TRAY) ×3 IMPLANT
MARKER SKIN DUAL TIP RULER LAB (MISCELLANEOUS) ×3 IMPLANT
MESH VENTRALIGHT ST 4.5IN (Mesh General) ×3 IMPLANT
NEEDLE SPNL 22GX3.5 QUINCKE BK (NEEDLE) ×3 IMPLANT
PAD POSITIONING PINK XL (MISCELLANEOUS) IMPLANT
POSITIONER SURGICAL ARM (MISCELLANEOUS) IMPLANT
SCISSORS LAP 5X35 DISP (ENDOMECHANICALS) ×3 IMPLANT
SET IRRIG TUBING LAPAROSCOPIC (IRRIGATION / IRRIGATOR) ×3 IMPLANT
SHEARS HARMONIC ACE PLUS 36CM (ENDOMECHANICALS) ×3 IMPLANT
SLEEVE XCEL OPT CAN 5 100 (ENDOMECHANICALS) ×6 IMPLANT
SOLUTION ANTI FOG 6CC (MISCELLANEOUS) ×3 IMPLANT
STRIP CLOSURE SKIN 1/2X4 (GAUZE/BANDAGES/DRESSINGS) IMPLANT
SUT MNCRL AB 4-0 PS2 18 (SUTURE) ×3 IMPLANT
SUT NOVA NAB DX-16 0-1 5-0 T12 (SUTURE) ×3 IMPLANT
TACKER 5MM HERNIA 3.5CML NAB (ENDOMECHANICALS) IMPLANT
TAPE CLOTH 4X10 WHT NS (GAUZE/BANDAGES/DRESSINGS) IMPLANT
TOWEL OR 17X26 10 PK STRL BLUE (TOWEL DISPOSABLE) ×3 IMPLANT
TOWEL OR NON WOVEN STRL DISP B (DISPOSABLE) ×3 IMPLANT
TRAY FOLEY W/METER SILVER 16FR (SET/KITS/TRAYS/PACK) IMPLANT
TRAY LAPAROSCOPIC (CUSTOM PROCEDURE TRAY) ×3 IMPLANT
TROCAR BLADELESS OPT 5 100 (ENDOMECHANICALS) ×3 IMPLANT
TROCAR XCEL NON-BLD 11X100MML (ENDOMECHANICALS) ×3 IMPLANT
TUBING INSUF HEATED (TUBING) ×3 IMPLANT

## 2017-07-17 NOTE — Op Note (Signed)
LAPAROSCOPIC INCISIONAL HERNIA WITH MESH, INSERTION OF MESH  Procedure Note  Steven [name] 07/17/2017   Pre-op Diagnosis: IINCARCERATED NCISIONAL HERNIA     Post-op Diagnosis: SAME  Procedure(s): LAPAROSCOPIC INCISIONAL HERNIA WITH MESH INSERTION OF MESH (11.4 CM ROUND VENTRALITE ST)  Surgeon(s): Abigail Miyamoto, MD  Anesthesia: General  Staff:  Circulator: Judd Lien, RN Relief Scrub: Gracy Racer Scrub Person: Doreene Adas  Estimated Blood Loss: Minimal               Indications: This is [name] who presents with an incarcerated hernia in the left upper quadrant. This is felt to contain only omentum. He has had [name] previous left upper quadrant hernia repaired open with mesh many years ago per the family's report. This was done elsewhere.  Findings: The patient was found have [name] fascial defect in the left upper quadrant. It was just inferior to the previous hernia repair at the lower edge. It was found to contain [name] large amount of omentum. The hernia was repair with [name] 11.4 cm round ventral light ST mesh from [facility]  Procedure: The patient was brought to the operating room and identified as the correct patient. He was placed supine on the operating table and general anesthesia was induced. His abdomen was then prepped and draped in the usual sterile fashion. I made [name] small incision the patient's right upper quadrant with [name] scalpel. I then used the 5 mm trocar and Optiview camera to slowly traversing all layers of the abdominal wall. I then gained entrance into the abdominal cavity.  I evaluated entrance site and not see evidence of any injury to the colon which was in the right upper quadrant. I then placed [name] 5 mm trocar in the patient's right lower quadrant under direct vision. I could visualize the hernia defect in the left upper quadrant. This was found to contain incarcerated omentum. No bowel was involved in the  hernia. I placed an 11 mm trocar in the patient's left lower quadrant under direct vision. I then placed [name] 5 mm trocar in the left midabdomen under direct vision as well. I grasped the omentum and was able to pull most of it with some difficulty out of the fascial defect. The fascial defect itself was only about 3-4 cm in size. I had to use the harmonic scalpel to finally free up the attachments of the omentum in the hernia sac. I did take down several other lesions in the left upper quadrant with the harmonic scalpel as well. I could visualize the piece of mesh that had been placed in the left upper quadrant. This repair had been an open repair. The hernia defect was evident lower edge of this. Prior to placing mesh, I evaluated the omentum. There had been some venous oozing from the omentum. This appeared to have subsided. I examined for some time and saw no further bleeding. I measured the fascial defect. I brought an 11.4cm piece of mesh onto the field. 4 separate 0 Novafil stay sutures were placed in the mesh. I then rolled the mesh up. I soaked it in saline. I then introduced it into the abdominal cavity under direct vision through the 11 mm trocar. I then unrolled the mesh. Under direct vision, and made 4 separate stab incisions in the skin. I then used the suture passer to pull all the sutures up to the abdominal wall. I then pulled the sutures tied pulling the mesh up  to the peritoneum. Wide coverage of the fascial defect appeared to be achieved. I tied the sutures in place. I then tacked the mesh in place circumferentially with absorbable tacker. Again, wide coverage of the fascial defect appeared to be achieved. I then again examined the abdominal cavity extensively. Hemostasis appeared to be achieved. I again examined the right upper quadrant and did not see any injury to the right colon or hepatic flexure. All ports were then removed under direct vision and the abdomen was deflated. All incisions were  anesthetized Marcaine and closed the 4-0 Monocryl subcutaneous cuticular sutures. Dermabond was then applied. The patient tolerated the procedure well. All the counts were correct at the end of the procedure. The patient was then extubated in the operating room and taken in [name] stable condition to the recovery room.          Steven [name]   Date: 07/17/2017  Time: 8:50 AM

## 2017-07-17 NOTE — Anesthesia Procedure Notes (Addendum)
Date/Time: 07/17/2017 7:35 AM Performed by: Pilar Grammes Pre-anesthesia Checklist: Patient identified, Emergency Drugs available, Suction available, Patient being monitored and Timeout performed Patient Re-evaluated:Patient Re-evaluated prior to induction Oxygen Delivery Method: Circle system utilized Preoxygenation: Pre-oxygenation with 100% oxygen Induction Type: IV induction Ventilation: Mask ventilation without difficulty Laryngoscope Size: 3 and Mac Grade View: Grade I Tube type: Oral Tube size: 7.5 mm Number of attempts: 1 Airway Equipment and Method: Stylet Placement Confirmation: positive ETCO2,  ETT inserted through vocal cords under direct vision,  CO2 detector and breath sounds checked- equal and bilateral Secured at: 20 cm Tube secured with: Tape Dental Injury: Teeth and Oropharynx as per pre-operative assessment

## 2017-07-17 NOTE — Anesthesia Preprocedure Evaluation (Signed)
Anesthesia Evaluation  Patient identified by MRN, date of birth, ID band Patient awake  General Assessment Comment:Downs syndrome  Reviewed: Allergy & Precautions, NPO status , Patient's Chart, lab work & pertinent test results  History of Anesthesia Complications (+) PROLONGED EMERGENCE  Airway Mallampati: II  TM Distance: <3 FB Neck ROM: Full    Dental no notable dental hx.    Pulmonary neg pulmonary ROS,    breath sounds clear to auscultation       Cardiovascular negative cardio ROS   Rhythm:Regular Rate:Normal     Neuro/Psych Some mental retardation    GI/Hepatic negative GI ROS, Neg liver ROS,   Endo/Other  Morbid obesity  Renal/GU negative Renal ROS     Musculoskeletal negative musculoskeletal ROS (+)   Abdominal (+) + obese,   Peds  Hematology   Anesthesia Other Findings   Reproductive/Obstetrics                             Anesthesia Physical Anesthesia Plan  ASA: II  Anesthesia Plan: General   Post-op Pain Management:    Induction:   PONV Risk Score and Plan: 3 and Ondansetron, Dexamethasone, Midazolam and Treatment may vary due to age or medical condition  Airway Management Planned: Oral ETT and Video Laryngoscope Planned  Additional Equipment:   Intra-op Plan:   Post-operative Plan: Extubation in OR  Informed Consent: I have reviewed the patients History and Physical, chart, labs and discussed the procedure including the risks, benefits and alternatives for the proposed anesthesia with the patient or authorized representative who has indicated his/her understanding and acceptance.   Dental advisory given  Plan Discussed with: CRNA  Anesthesia Plan Comments:         Anesthesia Quick Evaluation

## 2017-07-17 NOTE — Anesthesia Postprocedure Evaluation (Signed)
Anesthesia Post Note  Patient: Steven [name]  Procedure(s) Performed: Procedure(s) (LRB): LAPAROSCOPIC INCISIONAL HERNIA WITH MESH (N/A) INSERTION OF MESH (N/A)     Patient location during evaluation: PACU Anesthesia Type: General Level of consciousness: awake and alert Pain management: pain level controlled Vital Signs Assessment: post-procedure vital signs reviewed and stable Respiratory status: spontaneous breathing, nonlabored ventilation, respiratory function stable and patient connected to nasal cannula oxygen Cardiovascular status: blood pressure returned to baseline and stable Postop Assessment: no apparent nausea or vomiting Anesthetic complications: no    Last Vitals:  Vitals:   07/17/17 1015 07/17/17 1120  BP: 102/67 (!) 95/56  Pulse: 64 (!) 58  Resp: 16 16  Temp: 36.8 C 36.9 C  SpO2: 98% 99%    Last Pain:  Vitals:   07/17/17 1120  TempSrc: Axillary                 Kyliah Deanda,JAMES TERRILL

## 2017-07-17 NOTE — Interval H&P Note (Signed)
History and Physical Interval Note:no change in H and P  07/17/2017 7:04 AM  Steven [name]  has presented today for surgery, with the diagnosis of INCISIONAL HERNIA  The various methods of treatment have been discussed with the patient and family. After consideration of risks, benefits and other options for treatment, the patient has consented to  Procedure(s): LAPAROSCOPIC INCISIONAL HERNIA WITH MESH (N/A) INSERTION OF MESH (N/A) as a surgical intervention .  The patient's history has been reviewed, patient examined, no change in status, stable for surgery.  I have reviewed the patient's chart and labs.  Questions were answered to the patient's satisfaction.     Steven [name] A

## 2017-07-17 NOTE — Transfer of Care (Signed)
Immediate Anesthesia Transfer of Care Note  Patient: Steven [name]  Procedure(s) Performed: Procedure(s): LAPAROSCOPIC INCISIONAL HERNIA WITH MESH (N/A) INSERTION OF MESH (N/A)  Patient Location: PACU  Anesthesia Type:General  Level of Consciousness: alert  and patient cooperative  Airway & Oxygen Therapy: Patient Spontanous Breathing and Patient connected to face mask oxygen  Post-op Assessment: Report given to RN and Post -op Vital signs reviewed and stable  Post vital signs: stable  Last Vitals:  Vitals:   07/17/17 0534  BP: 102/69  Pulse: (!) 58  Resp: 18  Temp: 37.1 C  SpO2: 97%    Last Pain:  Vitals:   07/17/17 0534  TempSrc: Oral      Patients Stated Pain Goal: 4 (07/17/17 0546)  Complications: No apparent anesthesia complications

## 2017-07-18 DIAGNOSIS — K430 Incisional hernia with obstruction, without gangrene: Secondary | ICD-10-CM | POA: Diagnosis not present

## 2017-07-18 LAB — CBC
HEMATOCRIT: 36.6 % — AB (ref 39.0–52.0)
HEMOGLOBIN: 12.4 g/dL — AB (ref 13.0–17.0)
MCH: 32.3 pg (ref 26.0–34.0)
MCHC: 33.9 g/dL (ref 30.0–36.0)
MCV: 95.3 fL (ref 78.0–100.0)
Platelets: 219 10*3/uL (ref 150–400)
RBC: 3.84 MIL/uL — AB (ref 4.22–5.81)
RDW: 14.7 % (ref 11.5–15.5)
WBC: 12 10*3/uL — AB (ref 4.0–10.5)

## 2017-07-18 MED ORDER — OXYCODONE HCL 5 MG PO TABS: 5.0000 mg | ORAL_TABLET | Freq: Four times a day (QID) | ORAL | 0 refills | Status: DC | PRN

## 2017-07-18 MED ORDER — SODIUM CHLORIDE 0.9 % IV SOLN
Freq: Once | INTRAVENOUS | Status: AC
  Administered 2017-07-18: 06:00:00 via INTRAVENOUS

## 2017-07-18 NOTE — Progress Notes (Signed)
1 Day Post-Op   Subjective/Chief Complaint: Up in a chair Tolerating clear liquids His mother is in the room an reports that he had a good night   Objective: Vital signs in last 24 hours: Temp:  [97.4 F (36.3 C)-99.1 F (37.3 C)] 98.8 F (37.1 C) (09/19 0530) Pulse Rate:  [46-71] 49 (09/19 0530) Resp:  [13-18] 14 (09/19 0530) BP: (84-115)/(41-97) 84/48 (09/19 0540) SpO2:  [93 %-100 %] 93 % (09/19 0530) Weight:  [93 kg (205 lb)] 93 kg (205 lb) (09/18 1015) Last BM Date: 07/16/17  Intake/Output from previous day: 09/18 0701 - 09/19 0700 In: 4685 [P.O.:1680; I.V.:3005] Out: 325 [Urine:300; Blood:25] Intake/Output this shift: No intake/output data recorded.  Exam: Looks comfortable Abdomen soft, obese, appropriately tender  Lab Results:   Recent Labs  07/18/17 0548  WBC 12.0*  HGB 12.4*  HCT 36.6*  PLT 219   BMET No results for input(s): NA, K, CL, CO2, GLUCOSE, BUN, CREATININE, CALCIUM in the last 72 hours. PT/INR No results for input(s): LABPROT, INR in the last 72 hours. ABG No results for input(s): PHART, HCO3 in the last 72 hours.  Invalid input(s): PCO2, PO2  Studies/Results: No results found.  Anti-infectives: Anti-infectives    Start     Dose/Rate Route Frequency Ordered Stop   07/17/17 0704  ceFAZolin (ANCEF) 2-4 GM/100ML-% IVPB    Comments:  Mason Jim   : cabinet override      07/17/17 0704 07/17/17 0741   07/17/17 0535  ceFAZolin (ANCEF) IVPB 2g/100 mL premix     2 g 200 mL/hr over 30 Minutes Intravenous On call to O.R. 07/17/17 0535 07/17/17 0741      Assessment/Plan: s/p Procedure(s): LAPAROSCOPIC INCISIONAL HERNIA WITH MESH (N/A) INSERTION OF MESH (N/A)  Try full liquids Ambulate Decrease IVF Possible d/c this afternoon depending on how he is doing.  Again, given Down's Syndrome, may need another day.  LOS: 0 days    Zhana Jeangilles A 07/18/2017

## 2017-07-18 NOTE — Progress Notes (Signed)
Patient ID: Steven [name], male   DOB: 02/25/1966, 51 y.o.   MRN: [id number]   Doing well Tolerating po Ambulating well Abdomen remains soft  Plan: D/C home

## 2017-07-18 NOTE — Discharge Instructions (Signed)
[facility] _______[facility]  UMBILICAL OR INGUINAL HERNIA REPAIR: POST OP INSTRUCTIONS  Always review your discharge instruction sheet given to you by the facility where your surgery was performed. IF YOU HAVE DISABILITY OR FAMILY LEAVE FORMS, YOU MUST BRING THEM TO THE OFFICE FOR PROCESSING.   DO NOT GIVE THEM TO YOUR DOCTOR.  1. A  prescription for pain medication may be given to you upon discharge.  Take your pain medication as prescribed, if needed.  If narcotic pain medicine is not needed, then you may take acetaminophen (Tylenol) or ibuprofen (Advil) as needed. 2. Take your usually prescribed medications unless otherwise directed. If you need a refill on your pain medication, please contact your pharmacy.  They will contact our office to request authorization. Prescriptions will not be filled after 5 pm or on week-ends. 3. You should follow a light diet the first 24 hours after arrival home, such as soup and crackers, etc.  Be sure to include lots of fluids daily.  Resume your normal diet the day after surgery. 4.Most patients will experience some swelling and bruising around the umbilicus or in the groin and scrotum.  Ice packs and reclining will help.  Swelling and bruising can take several days to resolve.  6. It is common to experience some constipation if taking pain medication after surgery.  Increasing fluid intake and taking a stool softener (such as Colace) will usually help or prevent this problem from occurring.  A mild laxative (Milk of Magnesia or Miralax) should be taken according to package directions if there are no bowel movements after 48 hours. 7. Unless discharge instructions indicate otherwise, you may remove your bandages 24-48 hours after surgery, and you may shower at that time.  You may have steri-strips (small skin tapes) in place directly over the incision.  These strips should be left on the skin for 7-10 days.  If your surgeon used skin glue on the  incision, you may shower in 24 hours.  The glue will flake off over the next 2-3 weeks.  Any sutures or staples will be removed at the office during your follow-up visit. 8. ACTIVITIES:  You may resume regular (light) daily activities beginning the next day--such as daily self-care, walking, climbing stairs--gradually increasing activities as tolerated.  You may have sexual intercourse when it is comfortable.  Refrain from any heavy lifting or straining until approved by your doctor.  a.You may drive when you are no longer taking prescription pain medication, you can comfortably wear a seatbelt, and you can safely maneuver your car and apply brakes. b.RETURN TO WORK:  AS TOLERATED _____________________________________________  9.You should see your doctor in the office for a follow-up appointment approximately 2-3 weeks after your surgery.  Make sure that you call for this appointment within a day or two after you arrive home to insure a convenient appointment time. 10.OTHER INSTRUCTIONS: __NO LIFTING MORE THAN 15 POUNDS FOR 4 TO 6 WEEKS IBUPROFEN AND TYLENOL ALSO FOR PAIN OK TO SHOWER ADVANCE DIET AS TOLERATED_______________________    _____________________________________  WHEN TO CALL YOUR DOCTOR: 1. Fever over 101.0 2. Inability to urinate 3. Nausea and/or vomiting 4. Extreme swelling or bruising 5. Continued bleeding from incision. 6. Increased pain, redness, or drainage from the incision  The clinic staff is available to answer your questions during regular business hours.  Please dont hesitate to call and ask to speak to one of the nurses for clinical concerns.  If you have a medical emergency, go  to the nearest emergency room or call 911.  A surgeon from [facility] is always on call at the hospital   [address], [place], [place]  [address] ?  [address] [address], [place], [place]   [address] [phone] ? [phone] ? FAX [phone] Web site:  [email or web address]

## 2017-07-18 NOTE — Care Management Note (Signed)
Case Management Note  Patient Details  Name: Steven [name] MRN: [id number] Date of Birth: 30-Mar-1966  Subjective/Objective: 51 y/o m admitted w/ventral hernia. From home. POD#1 lap ventral hernia repair. No CM needs.                   Action/Plan:d/c home.   Expected Discharge Date:                  Expected Discharge Plan:  Home/Self Care  In-House Referral:     Discharge planning Services  CM Consult  Post Acute Care Choice:    Choice offered to:     DME Arranged:    DME Agency:     HH Arranged:    HH Agency:     Status of Service:  Completed, signed off  If discussed at [employer] of Stay Meetings, dates discussed:    Additional Comments:  Lanier Clam, RN 07/18/2017, 12:05 PM

## 2017-07-18 NOTE — Progress Notes (Signed)
Pts BP 86/47 P 49, pt lying in bed, no complaints of dizziness or lightheadedness. Notified on call physician for [facility]. Received call back from Dr. Nat Christen, new orders to give 500 ml 0.9% NS bolus over 1 hour.

## 2017-07-18 NOTE — Discharge Summary (Signed)
Physician Discharge Summary  Patient ID: Steven [name] MRN: [id number] DOB/AGE: 11/21/1965 51 y.o.  Admit date: 07/17/2017 Discharge date: 07/18/2017  Admission Diagnoses:  Discharge Diagnoses:  Active Problems:   Incarcerated incisional hernia   Discharged Condition: good  Hospital Course: uneventful post op recovery.  Discharged home doing well pod #1  Consults: None  Significant Diagnostic Studies:   Treatments: surgery: laparoscopic incisional hernia repair with mesh  Discharge Exam: Blood pressure (!) 91/56, pulse (!) 55, temperature 97.6 F (36.4 C), temperature source Oral, resp. rate 17, height 5' (1.524 m), weight 93 kg (205 lb), SpO2 99 %. General appearance: alert, cooperative and no distress Resp: clear to auscultation bilaterally Cardio: regular rate and rhythm, S1, S2 normal, no murmur, click, rub or gallop Incision/Wound:abdomen soft, minimally tender, incisions clean  Disposition: 01-Home or Self Care  Discharge Instructions    Discharge patient    Complete by:  As directed    Discharge disposition:  01-Home or Self Care   Discharge patient date:  07/18/2017     Allergies as of 07/18/2017   No Known Allergies     Medication List    TAKE these medications   multivitamin with minerals Tabs tablet Take 1 tablet by mouth daily.   OMEGA-3 CF PO Take 1,600 mg by mouth daily.   oxyCODONE 5 MG immediate release tablet Commonly known as:  Oxy IR/ROXICODONE Take 1-2 tablets (5-10 mg total) by mouth every 6 (six) hours as needed for moderate pain.   UBIQUINOL PO Take 1,000 mg by mouth daily.   VITAMIN C PO Take 1,000 mg by mouth daily.   VITAMIN D PO Take 5,000 Units by mouth daily.            Discharge Care Instructions        Start     Ordered   07/18/17 0000  oxyCODONE (OXY IR/ROXICODONE) 5 MG immediate release tablet  Every 6 hours PRN     07/18/17 1505   07/18/17 0000  Discharge patient    Question Answer Comment  Discharge  disposition 01-Home or Self Care   Discharge patient date 07/18/2017      07/18/17 1505     Follow-up Information    Abigail Miyamoto, MD. Schedule an appointment as soon as possible for a visit in 2 week(s).   Specialty:  General Surgery Contact information: [address] [place] [address] [phone]           Signed: Shelly Rubenstein 07/18/2017, 3:07 PM

## 2017-07-18 NOTE — Progress Notes (Signed)
Pt d/c to home. Discharge instructions, reasons to return to ED/MD, prescriptions, and follow up appts reviewed with pt and pt's mother at bedside. Pt and pt's mother confirmed teaching with teachback method. PIV removed without complication.

## 2017-07-25 DIAGNOSIS — K430 Incisional hernia with obstruction, without gangrene: Secondary | ICD-10-CM

## 2018-01-31 ENCOUNTER — Ambulatory Visit (INDEPENDENT_AMBULATORY_CARE_PROVIDER_SITE_OTHER): Payer: Medicare Other | Admitting: Physician Assistant

## 2018-01-31 ENCOUNTER — Other Ambulatory Visit: Payer: Self-pay

## 2018-01-31 ENCOUNTER — Encounter: Payer: Self-pay | Admitting: Physician Assistant

## 2018-01-31 VITALS — BP 104/70 | HR 50 | Temp 98.1°F | Resp 16 | Ht 60.0 in | Wt 190.0 lb

## 2018-01-31 DIAGNOSIS — K921 Melena: Secondary | ICD-10-CM

## 2018-01-31 DIAGNOSIS — R109 Unspecified abdominal pain: Secondary | ICD-10-CM

## 2018-01-31 DIAGNOSIS — K5900 Constipation, unspecified: Secondary | ICD-10-CM

## 2018-01-31 LAB — POCT URINALYSIS DIP (MANUAL ENTRY)
BILIRUBIN UA: NEGATIVE mg/dL
Bilirubin, UA: NEGATIVE
Glucose, UA: NEGATIVE mg/dL
Leukocytes, UA: NEGATIVE
Nitrite, UA: NEGATIVE
PH UA: 5.5 (ref 5.0–8.0)
PROTEIN UA: NEGATIVE mg/dL
RBC UA: NEGATIVE
SPEC GRAV UA: 1.015 (ref 1.010–1.025)
Urobilinogen, UA: 0.2 E.U./dL

## 2018-01-31 LAB — POC HEMOCCULT BLD/STL (OFFICE/1-CARD/DIAGNOSTIC): Fecal Occult Blood, POC: NEGATIVE

## 2018-01-31 MED ORDER — HYDROCORTISONE ACETATE 25 MG RE SUPP
25.0000 mg | Freq: Two times a day (BID) | RECTAL | 0 refills | Status: DC
Start: 2018-01-31 — End: 2018-12-20

## 2018-01-31 MED ORDER — POLYETHYLENE GLYCOL 3350 17 GM/SCOOP PO POWD: 17.0000 g | Freq: Two times a day (BID) | ORAL | 0 refills | Status: DC | PRN

## 2018-01-31 NOTE — Progress Notes (Signed)
PRIMARY CARE AT [facility] [address], [place] [address] [phone]  Date:  01/31/2018   Name:  Steven [name]   DOB:  June 03, 1966   MRN:  [id number]  PCP:  Patient, No Pcp Per    History of Present Illness:  Steven [name] is a 52 y.o. male patient who presents to PCP with  Chief Complaint  Patient presents with  . Rectal Bleeding    pt states the was small amount of blood in stool/   . Constipation    pt states it hurt to use bathroom for BM/ x 1 day     Rectal pain 2 days ago.  She is pushing hard and straining.  She is straining with bowel movements.  There  Is some blood in the stool.  However this was not present at the last bowel movement.  No dysuria, hematuria, no frequency, or weakened stream.   He is hydrating well with water.     Patient Active Problem List   Diagnosis Date Noted  . Incarcerated incisional hernia 07/17/2017    Past Medical History:  Diagnosis Date  . Complication of anesthesia    per patient father, patient was slow to wake with a previous surgery   . Incisional hernia   . MR (mental retardation)     Past Surgical History:  Procedure Laterality Date  . HERNIA REPAIR     unaware of the year of , maybe 10 years ago   . INCISIONAL HERNIA REPAIR N/A 07/17/2017   Procedure: LAPAROSCOPIC INCISIONAL HERNIA WITH MESH;  Surgeon: Abigail Miyamoto, MD;  Location: [facility];  Service: General;  Laterality: N/A;  . INSERTION OF MESH N/A 07/17/2017   Procedure: INSERTION OF MESH;  Surgeon: Abigail Miyamoto, MD;  Location: [facility];  Service: General;  Laterality: N/A;    Social History   Tobacco Use  . Smoking status: Never Smoker  . Smokeless tobacco: Never Used  Substance Use Topics  . Alcohol use: Yes    Alcohol/week: 0.6 - 1.2 oz    Types: 1 - 2 Glasses of wine per week  . Drug use: No    No family history on file.  No Known Allergies  Medication list has been reviewed and updated.  Current Outpatient Medications on File Prior to Visit   Medication Sig Dispense Refill  . Ascorbic Acid (VITAMIN C PO) Take 1,000 mg by mouth daily.     . Cholecalciferol (VITAMIN D PO) Take 5,000 Units by mouth daily.     . Multiple Vitamin (MULTIVITAMIN WITH MINERALS) TABS tablet Take 1 tablet by mouth daily.    . Omega-3 Fatty Acids (OMEGA-3 CF PO) Take 1,600 mg by mouth daily.    Marland Kitchen UBIQUINOL PO Take 1,000 mg by mouth daily.    Marland Kitchen oxyCODONE (OXY IR/ROXICODONE) 5 MG immediate release tablet Take 1-2 tablets (5-10 mg total) by mouth every 6 (six) hours as needed for moderate pain. (Patient not taking: Reported on 01/31/2018) 30 tablet 0   No current facility-administered medications on file prior to visit.     ROS ROS otherwise unremarkable unless listed above.  Physical Examination: BP 104/70   Pulse (!) 50   Temp 98.1 F (36.7 C) (Oral)   Resp 16   Ht 5' (1.524 m)   Wt 190 lb (86.2 kg)   SpO2 98%   BMI 37.11 kg/m  Ideal Body Weight: Weight in (lb) to have BMI = 25: 127.7  Physical Exam  Constitutional: He is oriented to  person, place, and time. He appears well-developed and well-nourished. No distress.  HENT:  Head: Normocephalic and atraumatic.  Eyes: Pupils are equal, round, and reactive to light. Conjunctivae and EOM are normal.  Cardiovascular: Normal rate and regular rhythm.  Pulmonary/Chest: Effort normal. No respiratory distress.  Abdominal: Soft. Normal appearance and bowel sounds are normal. There is no tenderness.  Genitourinary: Rectal exam shows internal hemorrhoid (possibly at the 5 oclock region). Rectal exam shows no external hemorrhoid and no fissure.  Neurological: He is alert and oriented to person, place, and time.  Skin: Skin is warm and dry. He is not diaphoretic.  Psychiatric: He has a normal mood and affect. His behavior is normal.    Results for orders placed or performed in visit on 01/31/18  POCT urinalysis dipstick  Result Value Ref Range   Color, UA yellow yellow   Clarity, UA clear clear    Glucose, UA negative negative mg/dL   Bilirubin, UA negative negative   Ketones, POC UA negative negative mg/dL   Spec Grav, UA 8.2951.015 6.2131.010 - 1.025   Blood, UA negative negative   pH, UA 5.5 5.0 - 8.0   Protein Ur, POC negative negative mg/dL   Urobilinogen, UA 0.2 0.2 or 1.0 E.U./dL   Nitrite, UA Negative Negative   Leukocytes, UA Negative Negative   Results for orders placed or performed in visit on 01/31/18  POCT urinalysis dipstick  Result Value Ref Range   Color, UA yellow yellow   Clarity, UA clear clear   Glucose, UA negative negative mg/dL   Bilirubin, UA negative negative   Ketones, POC UA negative negative mg/dL   Spec Grav, UA 0.8651.015 7.8461.010 - 1.025   Blood, UA negative negative   pH, UA 5.5 5.0 - 8.0   Protein Ur, POC negative negative mg/dL   Urobilinogen, UA 0.2 0.2 or 1.0 E.U./dL   Nitrite, UA Negative Negative   Leukocytes, UA Negative Negative  POC Hemoccult Bld/Stl (1-Cd Office Dx)  Result Value Ref Range   Card #1 Date 01-31-18    Fecal Occult Blood, POC Negative Negative     Assessment and Plan: Steven [name] is a 52 y.o. male who is here today for cc of  Chief Complaint  Patient presents with  . Rectal Bleeding    pt states the was small amount of blood in stool/   . Constipation    pt states it hurt to use bathroom for BM/ x 1 day   Constipation, unspecified constipation type - Plan: hydrocortisone (ANUSOL-HC) 25 MG suppository, polyethylene glycol powder (GLYCOLAX/MIRALAX) powder, POC Hemoccult Bld/Stl (1-Cd Office Dx)  Abdominal pain, unspecified abdominal location - Plan: POCT urinalysis dipstick  Blood in stool - Plan: POC Hemoccult Bld/Stl (1-Cd Office Dx)  Trena PlattStephanie Renly Guedes, PA-C [facility] 4/9/201911:49 AM

## 2018-01-31 NOTE — Patient Instructions (Addendum)
  Please do not use the miralax longer than 5 days at twice per day.  However you are able to use once per day.   I am giving you information of constipation.  Please refer to this.    Constipation, Adult Constipation is when a person:  Poops (has a bowel movement) fewer times in a week than normal.  Has a hard time pooping.  Has poop that is dry, hard, or bigger than normal.  Follow these instructions at home: Eating and drinking   Eat foods that have a lot of fiber, such as: ? Fresh fruits and vegetables. ? Whole grains. ? Beans.  Eat less of foods that are high in fat, low in fiber, or overly processed, such as: ? [place] fries. ? Hamburgers. ? Cookies. ? Candy. ? Soda.  Drink enough fluid to keep your pee (urine) clear or pale yellow. General instructions  Exercise regularly or as told by your doctor.  Go to the restroom when you feel like you need to poop. Do not hold it in.  Take over-the-counter and prescription medicines only as told by your doctor. These include any fiber supplements.  Do pelvic floor retraining exercises, such as: ? Doing deep breathing while relaxing your lower belly (abdomen). ? Relaxing your pelvic floor while pooping.  Watch your condition for any changes.  Keep all follow-up visits as told by your doctor. This is important. Contact a doctor if:  You have pain that gets worse.  You have a fever.  You have not pooped for 4 days.  You throw up (vomit).  You are not hungry.  You lose weight.  You are bleeding from the anus.  You have thin, pencil-like poop (stool). Get help right away if:  You have a fever, and your symptoms suddenly get worse.  You leak poop or have blood in your poop.  Your belly feels hard or bigger than normal (is bloated).  You have very bad belly pain.  You feel dizzy or you faint. This information is not intended to replace advice given to you by your health care provider. Make sure you discuss  any questions you have with your health care provider. Document Released: 04/03/2008 Document Revised: 05/05/2016 Document Reviewed: 04/05/2016 Elsevier Interactive Patient Education  2018 [employer]  IF you received an x-ray today, you will receive an invoice from [facility]. Please contact [facility] at [phone] with questions or concerns regarding your invoice.   IF you received labwork today, you will receive an invoice from [facility]. Please contact [facility] at [phone] with questions or concerns regarding your invoice.   Our billing staff will not be able to assist you with questions regarding bills from these companies.  You will be contacted with the lab results as soon as they are available. The fastest way to get your results is to activate your My Chart account. Instructions are located on the last page of this paperwork. If you have not heard from us regarding the results in 2 weeks, please contact this office.

## 2018-12-20 ENCOUNTER — Other Ambulatory Visit: Payer: Self-pay

## 2018-12-20 ENCOUNTER — Encounter: Payer: Self-pay | Admitting: Family Medicine

## 2018-12-20 ENCOUNTER — Ambulatory Visit (INDEPENDENT_AMBULATORY_CARE_PROVIDER_SITE_OTHER): Payer: Medicare Other | Admitting: Family Medicine

## 2018-12-20 VITALS — BP 107/69 | HR 48 | Temp 97.7°F | Ht 60.24 in | Wt 182.6 lb

## 2018-12-20 DIAGNOSIS — Z0000 Encounter for general adult medical examination without abnormal findings: Secondary | ICD-10-CM

## 2018-12-20 DIAGNOSIS — Z025 Encounter for examination for participation in sport: Secondary | ICD-10-CM

## 2018-12-20 NOTE — Patient Instructions (Addendum)
  Based on planned sport, I do not think further evaluation/testing is needed at this time.  Good luck tomorrow.  Follow-up at your convenience for cerumen removal from the ears.  I would also recommend updated tetanus if you have not had that in the past 10 years.  Thank you for coming in today.  If you have lab work done today you will be contacted with your lab results within the next 2 weeks.  If you have not heard from [place] then please contact us. The fastest way to get your results is to register for My Chart.   IF you received an x-ray today, you will receive an invoice from [facility]. Please contact [facility] at [phone] with questions or concerns regarding your invoice.   IF you received labwork today, you will receive an invoice from [facility]. Please contact [facility] at [phone] with questions or concerns regarding your invoice.   Our billing staff will not be able to assist you with questions regarding bills from these companies.  You will be contacted with the lab results as soon as they are available. The fastest way to get your results is to activate your My Chart account. Instructions are located on the last page of this paperwork. If you have not heard from [place] regarding the results in 2 weeks, please contact this office.

## 2018-12-20 NOTE — Progress Notes (Signed)
Subjective:    Patient ID: Steven [name], male    DOB: [birth year], 53 y.o.   MRN: [id number]  HPI Steven [name] is a 53 y.o. male Presents today for: Chief Complaint  Patient presents with  . special olympic form    physical   PCP: here.   Presents for completion of physical form for [employer].  Plans on bowling.  History of Down syndrome. Medical history reviewed, had incarcerated incisional hernia repaired July 17, 2017.  Previously repaired ventral hernia in [place]. No other surgeries.   Last optho exam 6 months ago. No new rx at that time.   Sees down bowling alley without difficulty.  No known heart or lung issues.  No known hx of congential heart defects.  Form is due tomorrow, bowls tomorrow.  No hx of atlanto axial instability known., no weakness in arms, dysesthesias or neck pain.  No current infection.  No hx of abnormal EKG.  Unknown last tetanus.  Declined last tetanus.      Patient Active Problem List   Diagnosis Date Noted  . Incarcerated incisional hernia 07/17/2017   Past Medical History:  Diagnosis Date  . Complication of anesthesia    per patient father, patient was slow to wake with a previous surgery   . Incisional hernia   . MR (mental retardation)    Past Surgical History:  Procedure Laterality Date  . HERNIA REPAIR     unaware of the year of , maybe 10 years ago   . INCISIONAL HERNIA REPAIR N/A 07/17/2017   Procedure: LAPAROSCOPIC INCISIONAL HERNIA WITH MESH;  Surgeon: Abigail Miyamoto, MD;  Location: [facility];  Service: General;  Laterality: N/A;  . INSERTION OF MESH N/A 07/17/2017   Procedure: INSERTION OF MESH;  Surgeon: Abigail Miyamoto, MD;  Location: [facility];  Service: General;  Laterality: N/A;   No Known Allergies Prior to Admission medications   Medication Sig Start Date End Date Taking? Authorizing Provider  Ascorbic Acid (VITAMIN C PO) Take 1,000 mg by mouth daily.    Yes [provider]  Cholecalciferol  (VITAMIN D PO) Take 5,000 Units by mouth daily.    Yes [provider]  Multiple Vitamin (MULTIVITAMIN WITH MINERALS) TABS tablet Take 1 tablet by mouth daily.   Yes [provider]  Omega-3 Fatty Acids (OMEGA-3 CF PO) Take 1,600 mg by mouth daily.   Yes [provider]   Social History   Socioeconomic History  . Marital status: Single    Spouse name: Not on file  . Number of children: Not on file  . Years of education: Not on file  . Highest education level: Not on file  Occupational History  . Not on file  Social Needs  . Financial resource strain: Not on file  . Food insecurity:    Worry: Not on file    Inability: Not on file  . Transportation needs:    Medical: Not on file    Non-medical: Not on file  Tobacco Use  . Smoking status: Never Smoker  . Smokeless tobacco: Never Used  Substance and Sexual Activity  . Alcohol use: Yes    Alcohol/week: 1.0 - 2.0 standard drinks    Types: 1 - 2 Glasses of wine per week  . Drug use: No  . Sexual activity: Not on file  Lifestyle  . Physical activity:    Days per week: Not on file    Minutes per session: Not on file  . Stress: Not  on file  Relationships  . Social connections:    Talks on phone: Not on file    Gets together: Not on file    Attends religious service: Not on file    Active member of club or organization: Not on file    Attends meetings of clubs or organizations: Not on file    Relationship status: Not on file  . Intimate partner violence:    Fear of current or ex partner: Not on file    Emotionally abused: Not on file    Physically abused: Not on file    Forced sexual activity: Not on file  Other Topics Concern  . Not on file  Social History Narrative  . Not on file    Review of Systems     Objective:   Physical Exam Vitals signs reviewed.  Constitutional:      Appearance: He is well-developed.  HENT:     Head: Atraumatic.     Right Ear: External ear normal. There is  impacted cerumen (bilat cerumen, small amt of tm visualizad appears nl. ).     Left Ear: External ear normal. There is impacted cerumen.  Eyes:     Conjunctiva/sclera: Conjunctivae normal.     Pupils: Pupils are equal, round, and reactive to light.  Neck:     Musculoskeletal: Normal range of motion and neck supple.     Thyroid: No thyromegaly.  Cardiovascular:     Rate and Rhythm: Normal rate and regular rhythm.     Heart sounds: Normal heart sounds.  Pulmonary:     Effort: Pulmonary effort is normal. No respiratory distress.     Breath sounds: Normal breath sounds. No wheezing.  Abdominal:     General: There is no distension.     Palpations: Abdomen is soft.     Tenderness: There is no abdominal tenderness.  Musculoskeletal: Normal range of motion.        General: No tenderness.     Comments: Good cerviacal and lumbar ROM.  From of hands, wrists, no deformities.   Lymphadenopathy:     Cervical: No cervical adenopathy.  Skin:    General: Skin is warm and dry.  Neurological:     Mental Status: He is alert and oriented to person, place, and time.     Deep Tendon Reflexes: Reflexes are normal and symmetric.  Psychiatric:        Behavior: Behavior normal.     Visual Acuity Screening   Right eye Left eye Both eyes  Without correction:     With correction: 20/100 20/100 20/70        Assessment & Plan:   Steven [name] is a 53 y.o. male Sports physical  -History of Down syndrome.  Denies history of cardiac issue or congenital heart defect, denies prior diagnosis of atlantoaxial instability.  Low intensity sport with bowling, decided against imaging at this time.   -No concerning findings on exam.   - Vision screen was below recommended level of 20/40, but reportedly there was some difficulty with obtaining response and he did have testing 6 months ago without change in prescription.  Noted on form.  Did recommend option of meeting with his ophthalmologist for specific testing  on that visual acuity.  As competition is tomorrow may not be able to have that done prior to that time.   - recommended updated Tdap/tetanus if not had within 10 years but that was declined at present.   - Some excess cerumen, plans  for follow-up visit for further evaluation/removal.   No orders of the defined types were placed in this encounter.  Patient Instructions    Based on planned sport, I do not think further evaluation/testing is needed at this time.  Good luck tomorrow.  Follow-up at your convenience for cerumen removal from the ears.  I would also recommend updated tetanus if you have not had that in the past 10 years.  Thank you for coming in today.  If you have lab work done today you will be contacted with your lab results within the next 2 weeks.  If you have not heard from [place] then please contact us. The fastest way to get your results is to register for My Chart.   IF you received an x-ray today, you will receive an invoice from [facility]. Please contact [facility] at [phone] with questions or concerns regarding your invoice.   IF you received labwork today, you will receive an invoice from [facility]. Please contact [facility] at [phone] with questions or concerns regarding your invoice.   Our billing staff will not be able to assist you with questions regarding bills from these companies.  You will be contacted with the lab results as soon as they are available. The fastest way to get your results is to activate your My Chart account. Instructions are located on the last page of this paperwork. If you have not heard from [place] regarding the results in 2 weeks, please contact this office.    Signed,   Meredith Staggers, MD [facility].  12/20/18 1:07 PM

## 2020-02-20 ENCOUNTER — Other Ambulatory Visit: Payer: Self-pay

## 2020-02-20 ENCOUNTER — Telehealth: Payer: Self-pay | Admitting: Family Medicine

## 2020-02-20 ENCOUNTER — Encounter (INDEPENDENT_AMBULATORY_CARE_PROVIDER_SITE_OTHER): Payer: Medicare Other | Admitting: Ophthalmology

## 2020-02-20 DIAGNOSIS — H43813 Vitreous degeneration, bilateral: Secondary | ICD-10-CM | POA: Diagnosis not present

## 2020-02-20 DIAGNOSIS — H34831 Tributary (branch) retinal vein occlusion, right eye, with macular edema: Secondary | ICD-10-CM | POA: Diagnosis not present

## 2020-02-20 DIAGNOSIS — Z0000 Encounter for general adult medical examination without abnormal findings: Secondary | ICD-10-CM

## 2020-02-20 NOTE — Telephone Encounter (Signed)
Please oerder labd for this patient fasting labs for upcoming  physical scheduled for 03/15/2020

## 2020-02-20 NOTE — Telephone Encounter (Signed)
Ordered A1c, CBC w/ Diff, CMP, and a lipid profile.

## 2020-03-09 ENCOUNTER — Ambulatory Visit (INDEPENDENT_AMBULATORY_CARE_PROVIDER_SITE_OTHER): Payer: Medicare Other | Admitting: Emergency Medicine

## 2020-03-09 ENCOUNTER — Other Ambulatory Visit: Payer: Self-pay

## 2020-03-09 DIAGNOSIS — Z0000 Encounter for general adult medical examination without abnormal findings: Secondary | ICD-10-CM

## 2020-03-09 NOTE — Progress Notes (Signed)
Lab only visit 

## 2020-03-10 LAB — COMPREHENSIVE METABOLIC PANEL
ALT: 11 IU/L (ref 0–44)
AST: 28 IU/L (ref 0–40)
Albumin/Globulin Ratio: 1.4 (ref 1.2–2.2)
Albumin: 3.7 g/dL — ABNORMAL LOW (ref 3.8–4.9)
Alkaline Phosphatase: 69 IU/L (ref 39–117)
BUN/Creatinine Ratio: 12 (ref 9–20)
BUN: 15 mg/dL (ref 6–24)
Bilirubin Total: 0.2 mg/dL (ref 0.0–1.2)
CO2: 23 mmol/L (ref 20–29)
Calcium: 8.8 mg/dL (ref 8.7–10.2)
Chloride: 104 mmol/L (ref 96–106)
Creatinine, Ser: 1.28 mg/dL — ABNORMAL HIGH (ref 0.76–1.27)
GFR calc Af Amer: 72 mL/min/{1.73_m2} (ref >59)
GFR calc non Af Amer: 63 mL/min/{1.73_m2} (ref >59)
Globulin, Total: 2.7 g/dL (ref 1.5–4.5)
Glucose: 80 mg/dL (ref 65–99)
Potassium: 4.6 mmol/L (ref 3.5–5.2)
Sodium: 141 mmol/L (ref 134–144)
Total Protein: 6.4 g/dL (ref 6.0–8.5)

## 2020-03-10 LAB — CBC WITH DIFFERENTIAL/PLATELET
Basophils Absolute: 0.1 10*3/uL (ref 0.0–0.2)
Basos: 3 %
EOS (ABSOLUTE): 0.1 10*3/uL (ref 0.0–0.4)
Eos: 2 %
Hematocrit: 44.9 % (ref 37.5–51.0)
Hemoglobin: 15.1 g/dL (ref 13.0–17.7)
Immature Grans (Abs): 0 10*3/uL (ref 0.0–0.1)
Immature Granulocytes: 0 %
Lymphocytes Absolute: 1.7 10*3/uL (ref 0.7–3.1)
Lymphs: 38 %
MCH: 32.8 pg (ref 26.6–33.0)
MCHC: 33.6 g/dL (ref 31.5–35.7)
MCV: 98 fL — ABNORMAL HIGH (ref 79–97)
Monocytes Absolute: 0.5 10*3/uL (ref 0.1–0.9)
Monocytes: 11 %
Neutrophils Absolute: 2.1 10*3/uL (ref 1.4–7.0)
Neutrophils: 46 %
Platelets: 248 10*3/uL (ref 150–450)
RBC: 4.6 x10E6/uL (ref 4.14–5.80)
RDW: 13.1 % (ref 11.6–15.4)
WBC: 4.6 10*3/uL (ref 3.4–10.8)

## 2020-03-10 LAB — LIPID PANEL
Chol/HDL Ratio: 5.4 ratio — ABNORMAL HIGH (ref 0.0–5.0)
Cholesterol, Total: 260 mg/dL — ABNORMAL HIGH (ref 100–199)
HDL: 48 mg/dL (ref >39)
LDL Chol Calc (NIH): 197 mg/dL — ABNORMAL HIGH (ref 0–99)
Triglycerides: 87 mg/dL (ref 0–149)
VLDL Cholesterol Cal: 15 mg/dL (ref 5–40)

## 2020-03-10 LAB — HEMOGLOBIN A1C
Est. average glucose Bld gHb Est-mCnc: 103 mg/dL
Hgb A1c MFr Bld: 5.2 % (ref 4.8–5.6)

## 2020-03-15 ENCOUNTER — Ambulatory Visit (INDEPENDENT_AMBULATORY_CARE_PROVIDER_SITE_OTHER): Payer: Medicare Other | Admitting: Family Medicine

## 2020-03-15 ENCOUNTER — Other Ambulatory Visit: Payer: Self-pay

## 2020-03-15 VITALS — BP 104/64 | HR 57 | Temp 98.3°F | Resp 15 | Ht 61.0 in | Wt 182.8 lb

## 2020-03-15 DIAGNOSIS — E785 Hyperlipidemia, unspecified: Secondary | ICD-10-CM

## 2020-03-15 DIAGNOSIS — Z0001 Encounter for general adult medical examination with abnormal findings: Secondary | ICD-10-CM | POA: Diagnosis not present

## 2020-03-15 DIAGNOSIS — Z23 Encounter for immunization: Secondary | ICD-10-CM

## 2020-03-15 DIAGNOSIS — Z1211 Encounter for screening for malignant neoplasm of colon: Secondary | ICD-10-CM | POA: Diagnosis not present

## 2020-03-15 DIAGNOSIS — Z0000 Encounter for general adult medical examination without abnormal findings: Secondary | ICD-10-CM

## 2020-03-15 MED ORDER — ATORVASTATIN CALCIUM 10 MG PO TABS: 10.0000 mg | ORAL_TABLET | Freq: Every day | ORAL | 1 refills | Status: DC

## 2020-03-15 NOTE — Progress Notes (Signed)
Subjective:  Patient ID: Steven [name], male    DOB: December 30, 1965  Age: 54 y.o. MRN: [id number]  CC:  Chief Complaint  Patient presents with  . Annual Exam    pt was seen for an eye exam and they expressed concern over a possible ocluded vein, would like a check of labs     HPI Steven [name] presents for   Annual exam as well as follow-up from optometry eval. Here with parents.  Optometry [name] PCP: me  Last physical with me was a [employer] sports physical in February 2020.  No blood work done at that time.  History of Down syndrome.  Bowling with [employer].  Note received from Dr. [name] at [facility].  Retinal disorder/retinal edema/hemorrhage.  Intraretinal thickening with adjacent hemes.  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  , Recommended follow-up with me for CBC, blood pressure,  blood sugar /A1c cholesterol panel check.  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  CBC normal, A1c normal, borderline creatinine on CMP, elevated lipids as below.  Fall Risk  03/15/2020 12/20/2018 01/31/2018 05/28/2017  Falls in the past year? 0 0 No No  Number falls in past yr: - 0 - -  Injury with Fall? - 0 - -  Follow up Falls evaluation completed Falls evaluation completed - -   Depression screen [facility] 2/9 03/15/2020 12/20/2018 01/31/2018 05/28/2017  Decreased Interest 0 0 0 0  Down, Depressed, Hopeless - 0 0 0  PHQ - 2 Score 0 0 0 0   Functional Status Survey: Is the patient deaf or have difficulty hearing?: No Does the patient have difficulty seeing, even when wearing glasses/contacts?: No Does the patient have difficulty concentrating, remembering, or making decisions?: Yes Does the patient have difficulty walking or climbing stairs?: No Does the patient have difficulty dressing or bathing?: No Does the patient have difficulty doing errands alone such as visiting a doctor's office or shopping?: Yes  6CIT Screen 03/15/2020  What Year? 4 points    What month? 3 points  What time? 3 points  Count back from 20 4 points  Months in reverse 4 points  Repeat phrase 10 points  Total Score 28   Advanced directives: paperwork given.   Hyperlipidemia: No current meds, elevated on recent lab work May 11. Lab Results  Component Value Date   CHOL 260 (H) 03/09/2020   HDL 48 03/09/2020   LDLCALC 197 (H) 03/09/2020   TRIG 87 03/09/2020   CHOLHDL 5.4 (H) 03/09/2020   Lab Results  Component Value Date   ALT 11 03/09/2020   AST 28 03/09/2020   ALKPHOS 69 03/09/2020   BILITOT 0.2 03/09/2020   Cancer screening: Overdue for colonoscopy. No direct FH of colon.  Prostate cancer - declined testing at this time after R/B of testing discussed.   Health maintenance No recent hep C screening, HIV screening or Tdap.  He has received both Covid vaccines.   Immunization History  Administered Date(s) Administered  . PFIZER SARS-COV-2 Vaccination 01/20/2020, 02/20/2020    Depression screen [facility] 2/9 03/15/2020 12/20/2018 01/31/2018 05/28/2017  Decreased Interest 0 0 0 0  Down, Depressed, Hopeless - 0 0 0  PHQ - 2 Score 0 0 0 0    No exam data present Followed by optometry as above.  Referred to retina specialist.  Dental: Last appt few years ago.   Exercise:  Bowling 1 day per week.  Walks few days per week.  No fast food.  sugar free soda. No sweet tea. Occasional beer.  Body mass index is 34.54 kg/m.   Wt Readings from Last 3 Encounters:  03/15/20 182 lb 12.8 oz (82.9 kg)  12/20/18 182 lb 9.6 oz (82.8 kg)  01/31/18 190 lb (86.2 kg)    Results for orders placed or performed in visit on 03/09/20  Hemoglobin A1c  Result Value Ref Range   Hgb A1c MFr Bld 5.2 4.8 - 5.6 %   Est. average glucose Bld gHb Est-mCnc 103 mg/dL  Lipid panel  Result Value Ref Range   Cholesterol, Total 260 (H) 100 - 199 mg/dL   Triglycerides 87 0 - 149 mg/dL   HDL 48 >39 mg/dL   VLDL Cholesterol Cal 15 5 - 40 mg/dL   LDL Chol Calc (NIH) 197 (H) 0 - 99  mg/dL   Chol/HDL Ratio 5.4 (H) 0.0 - 5.0 ratio  Comprehensive metabolic panel  Result Value Ref Range   Glucose 80 65 - 99 mg/dL   BUN 15 6 - 24 mg/dL   Creatinine, Ser 1.28 (H) 0.76 - 1.27 mg/dL   GFR calc non Af Amer 63 >59 mL/min/1.73   GFR calc Af Amer 72 >59 mL/min/1.73   BUN/Creatinine Ratio 12 9 - 20   Sodium 141 134 - 144 mmol/L   Potassium 4.6 3.5 - 5.2 mmol/L   Chloride 104 96 - 106 mmol/L   CO2 23 20 - 29 mmol/L   Calcium 8.8 8.7 - 10.2 mg/dL   Total Protein 6.4 6.0 - 8.5 g/dL   Albumin 3.7 (L) 3.8 - 4.9 g/dL   Globulin, Total 2.7 1.5 - 4.5 g/dL   Albumin/Globulin Ratio 1.4 1.2 - 2.2   Bilirubin Total 0.2 0.0 - 1.2 mg/dL   Alkaline Phosphatase 69 39 - 117 IU/L   AST 28 0 - 40 IU/L   ALT 11 0 - 44 IU/L  CBC with Differential/Platelet  Result Value Ref Range   WBC 4.6 3.4 - 10.8 x10E3/uL   RBC 4.60 4.14 - 5.80 x10E6/uL   Hemoglobin 15.1 13.0 - 17.7 g/dL   Hematocrit 44.9 37.5 - 51.0 %   MCV 98 (H) 79 - 97 fL   MCH 32.8 26.6 - 33.0 pg   MCHC 33.6 31.5 - 35.7 g/dL   RDW 13.1 11.6 - 15.4 %   Platelets 248 150 - 450 x10E3/uL   Neutrophils 46 Not Estab. %   Lymphs 38 Not Estab. %   Monocytes 11 Not Estab. %   Eos 2 Not Estab. %   Basos 3 Not Estab. %   Neutrophils Absolute 2.1 1.4 - 7.0 x10E3/uL   Lymphocytes Absolute 1.7 0.7 - 3.1 x10E3/uL   Monocytes Absolute 0.5 0.1 - 0.9 x10E3/uL   EOS (ABSOLUTE) 0.1 0.0 - 0.4 x10E3/uL   Basophils Absolute 0.1 0.0 - 0.2 x10E3/uL   Immature Granulocytes 0 Not Estab. %   Immature Grans (Abs) 0.0 0.0 - 0.1 x10E3/uL      History Patient Active Problem List   Diagnosis Date Noted  . Incarcerated incisional hernia 07/17/2017   Past Medical History:  Diagnosis Date  . Complication of anesthesia    per patient father, patient was slow to wake with a previous surgery   . Down syndrome   . Incisional hernia   . MR (mental retardation)    Past Surgical History:  Procedure Laterality Date  . HERNIA REPAIR     unaware of  the year of , maybe 10 years ago   .  INCISIONAL HERNIA REPAIR N/A 07/17/2017   Procedure: LAPAROSCOPIC INCISIONAL HERNIA WITH MESH;  Surgeon: Abigail MiyamotoBlackman, Douglas, MD;  Location: [facility];  Service: General;  Laterality: N/A;  . INSERTION OF MESH N/A 07/17/2017   Procedure: INSERTION OF MESH;  Surgeon: Abigail MiyamotoBlackman, Douglas, MD;  Location: [facility];  Service: General;  Laterality: N/A;   No Known Allergies Prior to Admission medications   Medication Sig Start Date End Date Taking? Authorizing Provider  Ascorbic Acid (VITAMIN C PO) Take 1,000 mg by mouth daily.    Yes [provider]  Cholecalciferol (VITAMIN D PO) Take 5,000 Units by mouth daily.    Yes [provider]  Multiple Vitamin (MULTIVITAMIN WITH MINERALS) TABS tablet Take 1 tablet by mouth daily.   Yes [provider]  Omega-3 Fatty Acids (OMEGA-3 CF PO) Take 1,600 mg by mouth daily.   Yes [provider]   Social History   Socioeconomic History  . Marital status: Single    Spouse name: Not on file  . Number of children: Not on file  . Years of education: Not on file  . Highest education level: Not on file  Occupational History  . Not on file  Tobacco Use  . Smoking status: Never Smoker  . Smokeless tobacco: Never Used  Substance and Sexual Activity  . Alcohol use: Yes    Alcohol/week: 1.0 - 2.0 standard drinks    Types: 1 - 2 Glasses of wine per week  . Drug use: No  . Sexual activity: Not on file  Other Topics Concern  . Not on file  Social History Narrative  . Not on file   Social Determinants of Health   Financial Resource Strain:   . Difficulty of Paying Living Expenses:   Food Insecurity:   . Worried About Programme researcher, broadcasting/film/videounning Out of Food in the Last Year:   . Baristaan Out of Food in the Last Year:   Transportation Needs:   . Freight forwarderLack of Transportation (Medical):   Marland Kitchen. Lack of Transportation (Non-Medical):   Physical Activity:   . Days of Exercise per Week:   . Minutes of Exercise per Session:     Stress:   . Feeling of Stress :   Social Connections:   . Frequency of Communication with Friends and Family:   . Frequency of Social Gatherings with Friends and Family:   . Attends Religious Services:   . Active Member of Clubs or Organizations:   . Attends BankerClub or Organization Meetings:   Marland Kitchen. Marital Status:   Intimate Partner Violence:   . Fear of Current or Ex-Partner:   . Emotionally Abused:   Marland Kitchen. Physically Abused:   . Sexually Abused:     Review of Systems 13 point review of systems per patient health survey noted.  Negative other than as indicated above or in HPI.    Objective:   Vitals:   03/15/20 1334  BP: 104/64  Pulse: (!) 57  Resp: 15  Temp: 98.3 F (36.8 C)  TempSrc: Temporal  SpO2: 100%  Weight: 182 lb 12.8 oz (82.9 kg)  Height: 5\' 1"  (1.549 m)     Physical Exam Vitals reviewed.  Constitutional:      Appearance: He is well-developed.  HENT:     Head: Normocephalic and atraumatic.     Right Ear: External ear normal.     Left Ear: External ear normal.  Eyes:     Conjunctiva/sclera: Conjunctivae normal.     Pupils: Pupils are equal, round, and reactive  to light.  Neck:     Thyroid: No thyromegaly.     Vascular: No carotid bruit or JVD.  Cardiovascular:     Rate and Rhythm: Normal rate and regular rhythm.     Heart sounds: Normal heart sounds. No murmur.  Pulmonary:     Effort: Pulmonary effort is normal. No respiratory distress.     Breath sounds: Normal breath sounds. No wheezing or rales.  Abdominal:     General: There is no distension.     Palpations: Abdomen is soft.     Tenderness: There is no abdominal tenderness.  Musculoskeletal:        General: No tenderness. Normal range of motion.     Cervical back: Normal range of motion and neck supple.  Lymphadenopathy:     Cervical: No cervical adenopathy.  Skin:    General: Skin is warm and dry.  Neurological:     Mental Status: He is alert and oriented to person, place, and time.     Deep  Tendon Reflexes: Reflexes are normal and symmetric.  Psychiatric:        Mood and Affect: Mood normal.        Behavior: Behavior normal.        Assessment & Plan:  Dimas Scheck is a 54 y.o. male . Annual physical exam  - -anticipatory guidance as below in AVS, screening labs above. Health maintenance items as above in HPI discussed/recommended as applicable.   Hyperlipidemia, unspecified hyperlipidemia type - Plan: atorvastatin (LIPITOR) 10 MG tablet  -Elevated LDL as above.  Also with possible branch retinal vein occlusion and xanthelasmas noted and ophthalmology exam, will start Lipitor 10 mg daily initially.  May need higher dosing but will check tolerability initially at this dose.  Recheck in 6 weeks.  Potential side effects and risks were discussed.  Special screening for malignant neoplasms, colon - Plan: Cologuard  - Screening options with colonoscopy versus Cologuard discussed. Discussed timing of repeat testing intervals if normal, as well as potential need for diagnostic Colonoscopy if positive Cologuard. Understanding expressed, and chose Cologuard.   Need for Tdap vaccination - Plan: CANCELED: Tdap vaccine greater than or equal to 7yo IM  -Deferred at this time.  Meds ordered this encounter  Medications  . atorvastatin (LIPITOR) 10 MG tablet    Sig: Take 1 tablet (10 mg total) by mouth daily.    Dispense:  90 tablet    Refill:  1   Patient Instructions   Start Lipitor once per day for cholesterol, recheck in 6 weeks for repeat testing.  We can check into coverage for tetanus shot at that time as well as other screening lab work that we discussed today.  Schedule dentist appointment when able.  Increase exercise to walking most days per week with a minimum of 150 minutes of exercise per week.  Thank you for coming in today.  If you have lab work done today you will be contacted with your lab results within the next 2 weeks.  If you have not heard from [place] then  please contact us. The fastest way to get your results is to register for My Chart.   IF you received an x-ray today, you will receive an invoice from [facility]. Please contact [facility] at [phone] with questions or concerns regarding your invoice.   IF you received labwork today, you will receive an invoice from [facility]. Please contact [facility] at [phone] with questions or concerns regarding your invoice.  Our billing staff will not be able to assist you with questions regarding bills from these companies.  You will be contacted with the lab results as soon as they are available. The fastest way to get your results is to activate your My Chart account. Instructions are located on the last page of this paperwork. If you have not heard from [place] regarding the results in 2 weeks, please contact this office.    High Cholesterol  High cholesterol is a condition in which the blood has high levels of a white, waxy, fat-like substance (cholesterol). The human body needs small amounts of cholesterol. The liver makes all the cholesterol that the body needs. Extra (excess) cholesterol comes from the food that we eat. Cholesterol is carried from the liver by the blood through the blood vessels. If you have high cholesterol, deposits (plaques) may build up on the walls of your blood vessels (arteries). Plaques make the arteries narrower and stiffer. Cholesterol plaques increase your risk for heart attack and stroke. Work with your health care provider to keep your cholesterol levels in a healthy range. What increases the risk? This condition is more likely to develop in people who:  Eat foods that are high in animal fat (saturated fat) or cholesterol.  Are overweight.  Are not getting enough exercise.  Have a family history of high cholesterol. What are the signs or symptoms? There are no symptoms of this condition. How is this diagnosed? This condition may be  diagnosed from the results of a blood test.  If you are older than age 51, your health care provider may check your cholesterol every 4-6 years.  You may be checked more often if you already have high cholesterol or other risk factors for heart disease. The blood test for cholesterol measures:  "Bad" cholesterol (LDL cholesterol). This is the main type of cholesterol that causes heart disease. The desired level for LDL is less than 100.  "Good" cholesterol (HDL cholesterol). This type helps to protect against heart disease by cleaning the arteries and carrying the LDL away. The desired level for HDL is 60 or higher.  Triglycerides. These are fats that the body can store or burn for energy. The desired number for triglycerides is lower than 150.  Total cholesterol. This is a measure of the total amount of cholesterol in your blood, including LDL cholesterol, HDL cholesterol, and triglycerides. A healthy number is less than 200. How is this treated? This condition is treated with diet changes, lifestyle changes, and medicines. Diet changes  This may include eating more whole grains, fruits, vegetables, nuts, and fish.  This may also include cutting back on red meat and foods that have a lot of added sugar. Lifestyle changes  Changes may include getting at least 40 minutes of aerobic exercise 3 times a week. Aerobic exercises include walking, biking, and swimming. Aerobic exercise along with a healthy diet can help you maintain a healthy weight.  Changes may also include quitting smoking. Medicines  Medicines are usually given if diet and lifestyle changes have failed to reduce your cholesterol to healthy levels.  Your health care provider may prescribe a statin medicine. Statin medicines have been shown to reduce cholesterol, which can reduce the risk of heart disease. Follow these instructions at home: Eating and drinking If told by your health care provider:  Eat chicken (without  skin), fish, veal, shellfish, ground [place] breast, and round or loin cuts of red meat.  Do not eat fried foods or  fatty meats, such as hot dogs and salami.  Eat plenty of fruits, such as apples.  Eat plenty of vegetables, such as broccoli, potatoes, and carrots.  Eat beans, peas, and lentils.  Eat grains such as barley, rice, couscous, and bulgur wheat.  Eat pasta without cream sauces.  Use skim or nonfat milk, and eat low-fat or nonfat yogurt and cheeses.  Do not eat or drink whole milk, cream, ice cream, egg yolks, or hard cheeses.  Do not eat stick margarine or tub margarines that contain trans fats (also called partially hydrogenated oils).  Do not eat saturated tropical oils, such as coconut oil and palm oil.  Do not eat cakes, cookies, crackers, or other baked goods that contain trans fats.  General instructions  Exercise as directed by your health care provider. Increase your activity level with activities such as gardening, walking, and taking the stairs.  Take over-the-counter and prescription medicines only as told by your health care provider.  Do not use any products that contain nicotine or tobacco, such as cigarettes and e-cigarettes. If you need help quitting, ask your health care provider.  Keep all follow-up visits as told by your health care provider. This is important. Contact a health care provider if:  You are struggling to maintain a healthy diet or weight.  You need help to start on an exercise program.  You need help to stop smoking. Get help right away if:  You have chest pain.  You have trouble breathing. This information is not intended to replace advice given to you by your health care provider. Make sure you discuss any questions you have with your health care provider. Document Revised: 10/19/2017 Document Reviewed: 04/15/2016 Elsevier Patient Education  2020 [employer].  Keeping you healthy  Get these tests  Blood pressure- Have  your blood pressure checked once a year by your healthcare provider.  Normal blood pressure is 120/80  Weight- Have your body mass index (BMI) calculated to screen for obesity.  BMI is a measure of body fat based on height and weight. You can also calculate your own BMI at [email or web address].  Cholesterol- Have your cholesterol checked every year.  Diabetes- Have your blood sugar checked regularly if you have high blood pressure, high cholesterol, have a family history of diabetes or if you are overweight.  Screening for Colon Cancer- Colonoscopy starting at age 48.  Screening may begin sooner depending on your family history and other health conditions. Follow up colonoscopy as directed by your Gastroenterologist.  Screening for Prostate Cancer- Both blood work (PSA) and a rectal exam help screen for Prostate Cancer.  Screening begins at age 67 with African-American men and at age 78 with Caucasian men.  Screening may begin sooner depending on your family history.  Take these medicines  Aspirin- One aspirin daily can help prevent Heart disease and Stroke.  Flu shot- Every fall.  Tetanus- Every 10 years.  Zostavax- Once after the age of 78 to prevent Shingles.  Pneumonia shot- Once after the age of 67; if you are younger than 33, ask your healthcare provider if you need a Pneumonia shot.  Take these steps  Don't smoke- If you do smoke, talk to your doctor about quitting.  For tips on how to quit, go to www.smokefree.gov or call 1-800-QUIT-NOW.  Be physically active- Exercise 5 days a week for at least 30 minutes.  If you are not already physically active start slow and gradually work up to 30 minutes of moderate  physical activity.  Examples of moderate activity include walking briskly, mowing the yard, dancing, swimming, bicycling, etc.  Eat a healthy diet- Eat a variety of healthy food such as fruits, vegetables, low fat milk, low fat cheese, yogurt, lean meant, poultry, fish,  beans, tofu, etc. For more information go to www.thenutritionsource.org  Drink alcohol in moderation- Limit alcohol intake to less than two drinks a day. Never drink and drive.  Dentist- Brush and floss twice daily; visit your dentist twice a year.  Depression- Your emotional health is as important as your physical health. If you're feeling down, or losing interest in things you would normally enjoy please talk to your healthcare provider.  Eye exam- Visit your eye doctor every year.  Safe sex- If you may be exposed to a sexually transmitted infection, use a condom.  Seat belts- Seat belts can save your life; always wear one.  Smoke/Carbon Monoxide detectors- These detectors need to be installed on the appropriate level of your home.  Replace batteries at least once a year.  Skin cancer- When out in the sun, cover up and use sunscreen 15 SPF or higher.  Violence- If anyone is threatening you, please tell your healthcare provider.  Living Will/ Health care power of attorney- Speak with your healthcare provider and family.     Signed, Meredith Staggers, MD [facility]

## 2020-03-15 NOTE — Patient Instructions (Addendum)
Start Lipitor once per day for cholesterol, recheck in 6 weeks for repeat testing.  We can check into coverage for tetanus shot at that time as well as other screening lab work that we discussed today.  Schedule dentist appointment when able.  Increase exercise to walking most days per week with a minimum of 150 minutes of exercise per week.  Thank you for coming in today.  If you have lab work done today you will be contacted with your lab results within the next 2 weeks.  If you have not heard from [place] then please contact us. The fastest way to get your results is to register for My Chart.   IF you received an x-ray today, you will receive an invoice from [facility]. Please contact [facility] at [phone] with questions or concerns regarding your invoice.   IF you received labwork today, you will receive an invoice from [place]. Please contact [facility] at [phone] with questions or concerns regarding your invoice.   Our billing staff will not be able to assist you with questions regarding bills from these companies.  You will be contacted with the lab results as soon as they are available. The fastest way to get your results is to activate your My Chart account. Instructions are located on the last page of this paperwork. If you have not heard from [place] regarding the results in 2 weeks, please contact this office.    High Cholesterol  High cholesterol is a condition in which the blood has high levels of a white, waxy, fat-like substance (cholesterol). The human body needs small amounts of cholesterol. The liver makes all the cholesterol that the body needs. Extra (excess) cholesterol comes from the food that we eat. Cholesterol is carried from the liver by the blood through the blood vessels. If you have high cholesterol, deposits (plaques) may build up on the walls of your blood vessels (arteries). Plaques make the arteries narrower and stiffer. Cholesterol plaques  increase your risk for heart attack and stroke. Work with your health care provider to keep your cholesterol levels in a healthy range. What increases the risk? This condition is more likely to develop in people who:  Eat foods that are high in animal fat (saturated fat) or cholesterol.  Are overweight.  Are not getting enough exercise.  Have a family history of high cholesterol. What are the signs or symptoms? There are no symptoms of this condition. How is this diagnosed? This condition may be diagnosed from the results of a blood test.  If you are older than age 68, your health care provider may check your cholesterol every 4-6 years.  You may be checked more often if you already have high cholesterol or other risk factors for heart disease. The blood test for cholesterol measures:  "Bad" cholesterol (LDL cholesterol). This is the main type of cholesterol that causes heart disease. The desired level for LDL is less than 100.  "Good" cholesterol (HDL cholesterol). This type helps to protect against heart disease by cleaning the arteries and carrying the LDL away. The desired level for HDL is 60 or higher.  Triglycerides. These are fats that the body can store or burn for energy. The desired number for triglycerides is lower than 150.  Total cholesterol. This is a measure of the total amount of cholesterol in your blood, including LDL cholesterol, HDL cholesterol, and triglycerides. A healthy number is less than 200. How is this treated? This condition is treated with diet changes,  lifestyle changes, and medicines. Diet changes  This may include eating more whole grains, fruits, vegetables, nuts, and fish.  This may also include cutting back on red meat and foods that have a lot of added sugar. Lifestyle changes  Changes may include getting at least 40 minutes of aerobic exercise 3 times a week. Aerobic exercises include walking, biking, and swimming. Aerobic exercise along with  a healthy diet can help you maintain a healthy weight.  Changes may also include quitting smoking. Medicines  Medicines are usually given if diet and lifestyle changes have failed to reduce your cholesterol to healthy levels.  Your health care provider may prescribe a statin medicine. Statin medicines have been shown to reduce cholesterol, which can reduce the risk of heart disease. Follow these instructions at home: Eating and drinking If told by your health care provider:  Eat chicken (without skin), fish, veal, shellfish, ground [place] breast, and round or loin cuts of red meat.  Do not eat fried foods or fatty meats, such as hot dogs and salami.  Eat plenty of fruits, such as apples.  Eat plenty of vegetables, such as broccoli, potatoes, and carrots.  Eat beans, peas, and lentils.  Eat grains such as barley, rice, couscous, and bulgur wheat.  Eat pasta without cream sauces.  Use skim or nonfat milk, and eat low-fat or nonfat yogurt and cheeses.  Do not eat or drink whole milk, cream, ice cream, egg yolks, or hard cheeses.  Do not eat stick margarine or tub margarines that contain trans fats (also called partially hydrogenated oils).  Do not eat saturated tropical oils, such as coconut oil and palm oil.  Do not eat cakes, cookies, crackers, or other baked goods that contain trans fats.  General instructions  Exercise as directed by your health care provider. Increase your activity level with activities such as gardening, walking, and taking the stairs.  Take over-the-counter and prescription medicines only as told by your health care provider.  Do not use any products that contain nicotine or tobacco, such as cigarettes and e-cigarettes. If you need help quitting, ask your health care provider.  Keep all follow-up visits as told by your health care provider. This is important. Contact a health care provider if:  You are struggling to maintain a healthy diet or  weight.  You need help to start on an exercise program.  You need help to stop smoking. Get help right away if:  You have chest pain.  You have trouble breathing. This information is not intended to replace advice given to you by your health care provider. Make sure you discuss any questions you have with your health care provider. Document Revised: 10/19/2017 Document Reviewed: 04/15/2016 Elsevier Patient Education  2020 [place] you healthy  Get these tests  Blood pressure- Have your blood pressure checked once a year by your healthcare provider.  Normal blood pressure is 120/80  Weight- Have your body mass index (BMI) calculated to screen for obesity.  BMI is a measure of body fat based on height and weight. You can also calculate your own BMI at ViewBanking.si.  Cholesterol- Have your cholesterol checked every year.  Diabetes- Have your blood sugar checked regularly if you have high blood pressure, high cholesterol, have a family history of diabetes or if you are overweight.  Screening for Colon Cancer- Colonoscopy starting at age 86.  Screening may begin sooner depending on your family history and other health conditions. Follow up colonoscopy as directed  by your Gastroenterologist.  Screening for Prostate Cancer- Both blood work (PSA) and a rectal exam help screen for Prostate Cancer.  Screening begins at age 40 with African-American men and at age 52 with Caucasian men.  Screening may begin sooner depending on your family history.  Take these medicines  Aspirin- One aspirin daily can help prevent Heart disease and Stroke.  Flu shot- Every fall.  Tetanus- Every 10 years.  Zostavax- Once after the age of 54 to prevent Shingles.  Pneumonia shot- Once after the age of 70; if you are younger than 22, ask your healthcare provider if you need a Pneumonia shot.  Take these steps  Don't smoke- If you do smoke, talk to your doctor about quitting.  For  tips on how to quit, go to www.smokefree.gov or call 1-800-QUIT-NOW.  Be physically active- Exercise 5 days a week for at least 30 minutes.  If you are not already physically active start slow and gradually work up to 30 minutes of moderate physical activity.  Examples of moderate activity include walking briskly, mowing the yard, dancing, swimming, bicycling, etc.  Eat a healthy diet- Eat a variety of healthy food such as fruits, vegetables, low fat milk, low fat cheese, yogurt, lean meant, poultry, fish, beans, tofu, etc. For more information go to www.thenutritionsource.org  Drink alcohol in moderation- Limit alcohol intake to less than two drinks a day. Never drink and drive.  Dentist- Brush and floss twice daily; visit your dentist twice a year.  Depression- Your emotional health is as important as your physical health. If you're feeling down, or losing interest in things you would normally enjoy please talk to your healthcare provider.  Eye exam- Visit your eye doctor every year.  Safe sex- If you may be exposed to a sexually transmitted infection, use a condom.  Seat belts- Seat belts can save your life; always wear one.  Smoke/Carbon Monoxide detectors- These detectors need to be installed on the appropriate level of your home.  Replace batteries at least once a year.  Skin cancer- When out in the sun, cover up and use sunscreen 15 SPF or higher.  Violence- If anyone is threatening you, please tell your healthcare provider.  Living Will/ Health care power of attorney- Speak with your healthcare provider and family.

## 2020-03-16 ENCOUNTER — Encounter: Payer: Self-pay | Admitting: Family Medicine

## 2020-03-19 ENCOUNTER — Encounter (INDEPENDENT_AMBULATORY_CARE_PROVIDER_SITE_OTHER): Payer: Medicare Other | Admitting: Ophthalmology

## 2020-03-19 ENCOUNTER — Other Ambulatory Visit: Payer: Self-pay

## 2020-03-19 DIAGNOSIS — H34831 Tributary (branch) retinal vein occlusion, right eye, with macular edema: Secondary | ICD-10-CM

## 2020-03-19 DIAGNOSIS — H2513 Age-related nuclear cataract, bilateral: Secondary | ICD-10-CM

## 2020-03-19 DIAGNOSIS — I10 Essential (primary) hypertension: Secondary | ICD-10-CM | POA: Diagnosis not present

## 2020-03-19 DIAGNOSIS — H35033 Hypertensive retinopathy, bilateral: Secondary | ICD-10-CM

## 2020-03-19 DIAGNOSIS — H43813 Vitreous degeneration, bilateral: Secondary | ICD-10-CM

## 2020-04-26 ENCOUNTER — Ambulatory Visit (INDEPENDENT_AMBULATORY_CARE_PROVIDER_SITE_OTHER): Payer: Medicare Other | Admitting: Family Medicine

## 2020-04-26 ENCOUNTER — Encounter: Payer: Self-pay | Admitting: Family Medicine

## 2020-04-26 ENCOUNTER — Other Ambulatory Visit: Payer: Self-pay

## 2020-04-26 VITALS — BP 121/74 | HR 38 | Temp 98.4°F | Ht 61.0 in | Wt 187.0 lb

## 2020-04-26 DIAGNOSIS — Z1211 Encounter for screening for malignant neoplasm of colon: Secondary | ICD-10-CM | POA: Diagnosis not present

## 2020-04-26 DIAGNOSIS — Z23 Encounter for immunization: Secondary | ICD-10-CM

## 2020-04-26 DIAGNOSIS — E785 Hyperlipidemia, unspecified: Secondary | ICD-10-CM

## 2020-04-26 NOTE — Progress Notes (Signed)
Subjective:  Patient ID: Steven [name], male    DOB: 01/23/1966  Age: 54 y.o. MRN: [id number]  CC:  Chief Complaint  Patient presents with  . Hyperlipidemia    Pt reports no physical symptoms of this condition since last OV.Pt takes medication as prescribed with no known side effects.  . Follow-up    from annual physical during last OV. Pr reports to changes to general health.    HPI Steven [name] presents for   Hyperlipidemia: Started on Lipitor 10 mg daily at May 17 visit.  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  Low intensity exercise discussed - walking 3-4 times per week - some increased frequency.  No fast food, occasional soda.  Wt Readings from Last 3 Encounters:  04/26/20 187 lb (84.8 kg)  03/15/20 182 lb 12.8 oz (82.9 kg)  12/20/18 182 lb 9.6 oz (82.8 kg)   No new side effects, no new myalgias.  Fasting today except coffee - collagen powder.  Lab Results  Component Value Date   CHOL 260 (H) 03/09/2020   HDL 48 03/09/2020   LDLCALC 197 (H) 03/09/2020   TRIG 87 03/09/2020   CHOLHDL 5.4 (H) 03/09/2020   Lab Results  Component Value Date   ALT 11 03/09/2020   AST 28 03/09/2020   ALKPHOS 69 03/09/2020   BILITOT 0.2 03/09/2020   Has not received cologuard in mail.   History Patient Active Problem List   Diagnosis Date Noted  . Incarcerated incisional hernia 07/17/2017   Past Medical History:  Diagnosis Date  . Complication of anesthesia    per patient father, patient was slow to wake with a previous surgery   . Down syndrome   . Incisional hernia   . MR (mental retardation)    Past Surgical History:  Procedure Laterality Date  . HERNIA REPAIR     unaware of the year of , maybe 10 years ago   . INCISIONAL HERNIA REPAIR N/A 07/17/2017   Procedure: LAPAROSCOPIC INCISIONAL HERNIA WITH MESH;  Surgeon: Abigail Miyamoto, MD;  Location: [facility];  Service: General;  Laterality: N/A;  . INSERTION OF MESH N/A  07/17/2017   Procedure: INSERTION OF MESH;  Surgeon: Abigail Miyamoto, MD;  Location: [facility];  Service: General;  Laterality: N/A;   No Known Allergies Prior to Admission medications   Medication Sig Start Date End Date Taking? Authorizing Provider  Ascorbic Acid (VITAMIN C PO) Take 1,000 mg by mouth daily.    Yes [provider]  atorvastatin (LIPITOR) 10 MG tablet Take 1 tablet (10 mg total) by mouth daily. 03/15/20  Yes Shade Flood, MD  Cholecalciferol (VITAMIN D PO) Take 5,000 Units by mouth daily.    Yes [provider]  Multiple Vitamin (MULTIVITAMIN WITH MINERALS) TABS tablet Take 1 tablet by mouth daily.   Yes [provider]  Omega-3 Fatty Acids (OMEGA-3 CF PO) Take 1,600 mg by mouth daily.   Yes [provider]   Social History   Socioeconomic History  . Marital status: Single    Spouse name: Not on file  . Number of children: Not on file  . Years of education: Not on file  . Highest education level: Not on file  Occupational History  . Not on file  Tobacco Use  . Smoking status: Never Smoker  . Smokeless tobacco: Never Used  Vaping Use  . Vaping Use: Never used  Substance and Sexual Activity  . Alcohol use: Yes  Alcohol/week: 1.0 - 2.0 standard drink    Types: 1 - 2 Glasses of wine per week  . Drug use: No  . Sexual activity: Not on file  Other Topics Concern  . Not on file  Social History Narrative  . Not on file   Social Determinants of Health   Financial Resource Strain:   . Difficulty of Paying Living Expenses:   Food Insecurity:   . Worried About Programme researcher, broadcasting/film/video in the Last Year:   . Barista in the Last Year:   Transportation Needs:   . Freight forwarder (Medical):   Marland Kitchen Lack of Transportation (Non-Medical):   Physical Activity:   . Days of Exercise per Week:   . Minutes of Exercise per Session:   Stress:   . Feeling of Stress :   Social Connections:   . Frequency of Communication with  Friends and Family:   . Frequency of Social Gatherings with Friends and Family:   . Attends Religious Services:   . Active Member of Clubs or Organizations:   . Attends Banker Meetings:   Marland Kitchen Marital Status:   Intimate Partner Violence:   . Fear of Current or Ex-Partner:   . Emotionally Abused:   Marland Kitchen Physically Abused:   . Sexually Abused:     Review of Systems Per HPI.   Objective:   Vitals:   04/26/20 0956  BP: 121/74  Pulse: (!) 38  Temp: 98.4 F (36.9 C)  TempSrc: Temporal  SpO2: 100%  Weight: 187 lb (84.8 kg)  Height: 5\' 1"  (1.549 m)     Physical Exam Vitals reviewed.  Constitutional:      General: He is not in acute distress.    Appearance: He is well-developed.  HENT:     Head: Normocephalic and atraumatic.  Cardiovascular:     Rate and Rhythm: Normal rate.  Pulmonary:     Effort: Pulmonary effort is normal.  Neurological:     Mental Status: He is alert and oriented to person, place, and time.        Assessment & Plan:  Steven [name] is a 54 y.o. male . Hyperlipidemia, unspecified hyperlipidemia type - Plan: Hepatic Function Panel, Lipid panel  -  Stable, tolerating current regimen.. Labs pending as above. Likely will need higher dose based on initial readings, then 6 weeks lab visit.   Need for Tdap vaccination - Plan: Tdap vaccine greater than or equal to 7yo IM  Special screening for malignant neoplasms, colon - Plan: Cologuard reordered.    No orders of the defined types were placed in this encounter.  Patient Instructions     I will check cholesterol levels today.  I anticipate a higher dose may be needed.  If we do change the dose, then follow-up in 6 weeks for a lab only visit.  Let me know if there are any questions in the meantime.  If we do go to a higher dose and there are any new side effects, let me know right away.  Thank you for coming in today.   If you have lab work done today you will be contacted with your lab  results within the next 2 weeks.  If you have not heard from 57 then please contact us. The fastest way to get your results is to register for My Chart.   IF you received an x-ray today, you will receive an invoice from [facility]. Please contact [facility] at [phone]  with questions or concerns regarding your invoice.   IF you received labwork today, you will receive an invoice from [place]. Please contact [facility] at [phone] with questions or concerns regarding your invoice.   Our billing staff will not be able to assist you with questions regarding bills from these companies.  You will be contacted with the lab results as soon as they are available. The fastest way to get your results is to activate your My Chart account. Instructions are located on the last page of this paperwork. If you have not heard from [place] regarding the results in 2 weeks, please contact this office.         Signed, Merri Ray, MD [facility]

## 2020-04-26 NOTE — Patient Instructions (Addendum)
° °  I will check cholesterol levels today.  I anticipate a higher dose may be needed.  If we do change the dose, then follow-up in 6 weeks for a lab only visit.  Let me know if there are any questions in the meantime.  If we do go to a higher dose and there are any new side effects, let me know right away.  Thank you for coming in today.   If you have lab work done today you will be contacted with your lab results within the next 2 weeks.  If you have not heard from [place] then please contact us. The fastest way to get your results is to register for My Chart.   IF you received an x-ray today, you will receive an invoice from [facility]. Please contact [facility] at [phone] with questions or concerns regarding your invoice.   IF you received labwork today, you will receive an invoice from [place]. Please contact [facility] at [phone] with questions or concerns regarding your invoice.   Our billing staff will not be able to assist you with questions regarding bills from these companies.  You will be contacted with the lab results as soon as they are available. The fastest way to get your results is to activate your My Chart account. Instructions are located on the last page of this paperwork. If you have not heard from [place] regarding the results in 2 weeks, please contact this office.

## 2020-04-27 LAB — HEPATIC FUNCTION PANEL
ALT: 18 IU/L (ref 0–44)
AST: 29 IU/L (ref 0–40)
Albumin: 3.7 g/dL — ABNORMAL LOW (ref 3.8–4.9)
Alkaline Phosphatase: 71 IU/L (ref 48–121)
Bilirubin Total: 0.4 mg/dL (ref 0.0–1.2)
Bilirubin, Direct: 0.11 mg/dL (ref 0.00–0.40)
Total Protein: 6.5 g/dL (ref 6.0–8.5)

## 2020-04-27 LAB — LIPID PANEL
Chol/HDL Ratio: 3.7 ratio (ref 0.0–5.0)
Cholesterol, Total: 193 mg/dL (ref 100–199)
HDL: 52 mg/dL (ref >39)
LDL Chol Calc (NIH): 125 mg/dL — ABNORMAL HIGH (ref 0–99)
Triglycerides: 89 mg/dL (ref 0–149)
VLDL Cholesterol Cal: 16 mg/dL (ref 5–40)

## 2020-05-19 ENCOUNTER — Encounter (INDEPENDENT_AMBULATORY_CARE_PROVIDER_SITE_OTHER): Payer: Medicare Other | Admitting: Ophthalmology

## 2020-05-22 ENCOUNTER — Encounter: Payer: Self-pay | Admitting: Family Medicine

## 2020-06-07 ENCOUNTER — Ambulatory Visit: Payer: Medicare Other

## 2020-06-23 LAB — COLOGUARD: COLOGUARD: NEGATIVE

## 2020-06-23 LAB — EXTERNAL GENERIC LAB PROCEDURE: COLOGUARD: NEGATIVE

## 2020-06-25 LAB — COLOGUARD: Cologuard: NEGATIVE

## 2020-11-14 ENCOUNTER — Other Ambulatory Visit: Payer: Self-pay | Admitting: Family Medicine

## 2020-11-14 DIAGNOSIS — E785 Hyperlipidemia, unspecified: Secondary | ICD-10-CM

## 2020-12-08 ENCOUNTER — Encounter: Payer: Self-pay | Admitting: Family Medicine

## 2021-01-19 ENCOUNTER — Ambulatory Visit: Payer: Medicare Other | Admitting: Family Medicine

## 2021-01-21 ENCOUNTER — Encounter: Payer: Self-pay | Admitting: Family Medicine

## 2021-01-21 ENCOUNTER — Other Ambulatory Visit: Payer: Self-pay

## 2021-01-21 ENCOUNTER — Ambulatory Visit (INDEPENDENT_AMBULATORY_CARE_PROVIDER_SITE_OTHER): Payer: Medicare Other | Admitting: Family Medicine

## 2021-01-21 VITALS — BP 116/80 | HR 45 | Temp 98.2°F | Ht 61.5 in | Wt 188.0 lb

## 2021-01-21 DIAGNOSIS — Q909 Down syndrome, unspecified: Secondary | ICD-10-CM | POA: Diagnosis not present

## 2021-01-21 DIAGNOSIS — E785 Hyperlipidemia, unspecified: Secondary | ICD-10-CM

## 2021-01-21 MED ORDER — ATORVASTATIN CALCIUM 10 MG PO TABS: 10.0000 mg | ORAL_TABLET | Freq: Every day | ORAL | 1 refills | Status: DC

## 2021-01-21 NOTE — Patient Instructions (Addendum)
My new practice: [facility] Address: [address], [place], [place] [address] Phone: [phone]  Here are a few lab drawing stations for you to have your labwork performed:  [facility] [address], [address] [place], [place] [address]  [facility] [address] [place], [place] [address]  Good luck with bowling .    If you have lab work done today you will be contacted with your lab results within the next 2 weeks.  If you have not heard from [place] then please contact us. The fastest way to get your results is to register for My Chart.   IF you received an x-ray today, you will receive an invoice from [facility]. Please contact [facility]iology at [phone] with questions or concerns regarding your invoice.   IF you received labwork today, you will receive an invoice from [place]. Please contact [facility] at [phone] with questions or concerns regarding your invoice.   Our billing staff will not be able to assist you with questions regarding bills from these companies.  You will be contacted with the lab results as soon as they are available. The fastest way to get your results is to activate your My Chart account. Instructions are located on the last page of this paperwork. If you have not heard from [place] regarding the results in 2 weeks, please contact this office.

## 2021-01-21 NOTE — Progress Notes (Incomplete)
Subjective:  Patient ID: Steven [name], male    DOB: [birth year]  Age: 56 y.o. MRN: [id number]  CC:  Chief Complaint  Patient presents with  . 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 Pt reports he is feeling well.     HPI Steven [name] presents for  Clearance form for [employer]. Bowling.  Hx of Down syndrome. No hx of congenital heart defect/cardiac issues. No hx of atlantoaxial instability.  Vision concern discussed at last sport physical  Had eye exam 2 months ago - 20/20 at that visit with glasses.   No exam data present      History Patient Active Problem List   Diagnosis Date Noted  . Incarcerated incisional hernia 07/17/2017   Past Medical History:  Diagnosis Date  . Complication of anesthesia    per patient father, patient was slow to wake with a previous surgery   . Down syndrome   . Incisional hernia   . MR (mental retardation)    Past Surgical History:  Procedure Laterality Date  . HERNIA REPAIR     unaware of the year of , maybe 10 years ago   . INCISIONAL HERNIA REPAIR N/A 07/17/2017   Procedure: LAPAROSCOPIC INCISIONAL HERNIA WITH MESH;  Surgeon: Abigail Miyamoto, MD;  Location: [facility];  Service: General;  Laterality: N/A;  . INSERTION OF MESH N/A 07/17/2017   Procedure: INSERTION OF MESH;  Surgeon: Abigail Miyamoto, MD;  Location: [facility];  Service: General;  Laterality: N/A;   No Known Allergies Prior to Admission medications   Medication Sig Start Date End Date Taking? Authorizing Provider  Ascorbic Acid (VITAMIN C PO) Take 1,000 mg by mouth daily.    Yes [provider]  atorvastatin (LIPITOR) 10 MG tablet TAKE ONE TABLET BY MOUTH ONE TIME DAILY 11/14/20  Yes Shade Flood, MD  Cholecalciferol (VITAMIN D PO) Take 5,000 Units by mouth daily.    Yes [provider]  Multiple Vitamin (MULTIVITAMIN WITH MINERALS) TABS tablet Take 1 tablet by mouth daily.   Yes [provider]   Omega-3 Fatty Acids (OMEGA-3 CF PO) Take 1,600 mg by mouth daily.   Yes [provider]   Social History   Socioeconomic History  . Marital status: Single    Spouse name: Not on file  . Number of children: Not on file  . Years of education: Not on file  . Highest education level: Not on file  Occupational History  . Not on file  Tobacco Use  . Smoking status: Never Smoker  . Smokeless tobacco: Never Used  Vaping Use  . Vaping Use: Never used  Substance and Sexual Activity  . Alcohol use: Yes    Alcohol/week: 1.0 - 2.0 standard drink    Types: 1 - 2 Glasses of wine per week  . Drug use: No  . Sexual activity: Not on file  Other Topics Concern  . Not on file  Social History Narrative  . Not on file   Social Determinants of Health   Financial Resource Strain: Not on file  Food Insecurity: Not on file  Transportation Needs: Not on file  Physical Activity: Not on file  Stress: Not on file  Social Connections: Not on file  Intimate Partner Violence: Not on file    Review of Systems   Objective:   Vitals:   01/21/21 1135  BP: 114/80  Pulse: (!) 45  Temp: 98.2 F (36.8  C)  TempSrc: Temporal  SpO2: 99%  Weight: 188 lb (85.3 kg)  Height: 5' 1.5" (1.562 m)     Physical Exam     Assessment & Plan:  Steven [name] is a 55 y.o. male . No diagnosis found.   No orders of the defined types were placed in this encounter.  Patient Instructions   [facility] Address: [address] [place], [place], [place] [address] Phone: [phone]    If you have lab work done today you will be contacted with your lab results within the next 2 weeks.  If you have not heard from [place] then please contact us. The fastest way to get your results is to register for My Chart.   IF you received an x-ray today, you will receive an invoice from [facility]. Please contact [facility] Radiology at [phone] with questions or concerns regarding your  invoice.   IF you received labwork today, you will receive an invoice from [place]. Please contact [facility] at [phone] with questions or concerns regarding your invoice.   Our billing staff will not be able to assist you with questions regarding bills from these companies.  You will be contacted with the lab results as soon as they are available. The fastest way to get your results is to activate your My Chart account. Instructions are located on the last page of this paperwork. If you have not heard from [place] regarding the results in 2 weeks, please contact this office.         Signed, Meredith Staggers, MD [facility]

## 2021-01-22 ENCOUNTER — Encounter: Payer: Self-pay | Admitting: Family Medicine

## 2021-01-22 NOTE — Progress Notes (Signed)
Subjective:  Patient ID: Steven [name], male    DOB: 01/01/1966  Age: 55 y.o. 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 Pt reports he is feeling well.     HPI Steven [name] presents for  Presents for evaluation for [employer].  History of Down syndrome.  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  Has done this a few years ago without difficulty.  No history of congenital heart defect or cardiac issues, no history of atlantoaxial instability or neck issues.  Previous sports physical indicated borderline vision but he has been seen by his eye specialist 2 months ago and reportedly 20/20 at that visit with his glasses which he does wear at all times.  Additional history provided by family member.  No acute concerns today.  Hyperlipidemia: Lipitor 10 mg daily.  No new side effects. Lab Results  Component Value Date   CHOL 193 04/26/2020   HDL 52 04/26/2020   LDLCALC 125 (H) 04/26/2020   TRIG 89 04/26/2020   CHOLHDL 3.7 04/26/2020   Lab Results  Component Value Date   ALT 18 04/26/2020   AST 29 04/26/2020   ALKPHOS 71 04/26/2020   BILITOT 0.4 04/26/2020     History Patient Active Problem List   Diagnosis Date Noted   Incarcerated incisional hernia 07/17/2017   Past Medical History:  Diagnosis Date   Complication of anesthesia    per patient father, patient was slow to wake with a previous surgery    Down syndrome    Incisional hernia    MR (mental retardation)    Past Surgical History:  Procedure Laterality Date   HERNIA REPAIR     unaware of the year of , maybe 10 years ago    INCISIONAL HERNIA REPAIR N/A 07/17/2017   Procedure: LAPAROSCOPIC INCISIONAL HERNIA WITH MESH;  Surgeon: Abigail Miyamoto, MD;  Location: [facility];  Service: General;  Laterality: N/A;   INSERTION OF MESH N/A 07/17/2017   Procedure: INSERTION OF MESH;  Surgeon: Abigail Miyamoto, MD;  Location:  [facility];  Service: General;  Laterality: N/A;   No Known Allergies Prior to Admission medications   Medication Sig Start Date End Date Taking? Authorizing Provider  Ascorbic Acid (VITAMIN C PO) Take 1,000 mg by mouth daily.    Yes [provider]  Cholecalciferol (VITAMIN D PO) Take 5,000 Units by mouth daily.    Yes [provider]  Multiple Vitamin (MULTIVITAMIN WITH MINERALS) TABS tablet Take 1 tablet by mouth daily.   Yes [provider]  Omega-3 Fatty Acids (OMEGA-3 CF PO) Take 1,600 mg by mouth daily.   Yes [provider]  atorvastatin (LIPITOR) 10 MG tablet Take 1 tablet (10 mg total) by mouth daily. 01/21/21   Shade Flood, MD   Social History   Socioeconomic History   Marital status: Single    Spouse name: Not on file   Number of children: Not on file   Years of education: Not on file   Highest education level: Not on file  Occupational History   Not on file  Tobacco Use   Smoking status: Never Smoker   Smokeless tobacco: Never Used  Vaping Use   Vaping Use: Never used  Substance and Sexual Activity   Alcohol use: Yes    Alcohol/week: 1.0 - 2.0 standard drink    Types: 1 - 2 Glasses  of wine per week   Drug use: No   Sexual activity: Not on file  Other Topics Concern   Not on file  Social History Narrative   Not on file   Social Determinants of Health   Financial Resource Strain: Not on file  Food Insecurity: Not on file  Transportation Needs: Not on file  Physical Activity: Not on file  Stress: Not on file  Social Connections: Not on file  Intimate Partner Violence: Not on file    Review of Systems  Per HPI Objective:   Vitals:   01/21/21 1135 01/21/21 1304  BP: 114/80 116/80  Pulse: (!) 45   Temp: 98.2 F (36.8 C)   TempSrc: Temporal   SpO2: 99%   Weight: 188 lb (85.3 kg)   Height: 5' 1.5" (1.562 m)      Physical Exam Vitals reviewed.  Constitutional:      General: He is not in acute  distress.    Appearance: Normal appearance. He is well-developed. He is not ill-appearing.  HENT:     Head: Normocephalic and atraumatic.     Right Ear: External ear normal.     Left Ear: External ear normal.     Ears:     Comments: Excess cerumen noted in bilateral canals but able to hear finger rub bilaterally without difficulty. Eyes:     Conjunctiva/sclera: Conjunctivae normal.     Pupils: Pupils are equal, round, and reactive to light.     Comments: Esotropia on the left.  Neck:     Thyroid: No thyromegaly.  Cardiovascular:     Rate and Rhythm: Normal rate and regular rhythm.     Heart sounds: Normal heart sounds.  Pulmonary:     Effort: Pulmonary effort is normal. No respiratory distress.     Breath sounds: Normal breath sounds. No wheezing.  Abdominal:     General: There is no distension.     Palpations: Abdomen is soft.     Tenderness: There is no abdominal tenderness.  Musculoskeletal:        General: No tenderness. Normal range of motion.     Cervical back: Normal range of motion and neck supple.     Comments: Minimal decreased cervical spine rotation but equal bilaterally,, denies any neck pain, dysesthesias.  Intact and equal upper and lower extremity strength without apparent deficits.  Grip strength, range of motion normal both hands.  No apparent bony abnormalities of hands/wrists.  Lymphadenopathy:     Cervical: No cervical adenopathy.  Skin:    General: Skin is warm and dry.  Neurological:     General: No focal deficit present.     Mental Status: He is alert and oriented to person, place, and time.     Deep Tendon Reflexes: Reflexes are normal and symmetric.     Comments: 1+ patella, Achilles, biceps, triceps, brachioradialis reflexes.  Equal.  Psychiatric:        Mood and Affect: Mood normal.        Behavior: Behavior normal.        Assessment & Plan:  Steven [name] is a 55 y.o. male . Down syndrome  -No apparent contraindications to planned  competition with bowling at [employer].  Form completed.  Hyperlipidemia, unspecified hyperlipidemia type - Plan: Comprehensive metabolic panel, Lipid panel, atorvastatin (LIPITOR) 10 MG tablet  -Tolerating Lipitor, continue same.  Labs ordered.  Meds ordered this encounter  Medications   atorvastatin (LIPITOR) 10 MG tablet    Sig: Take  1 tablet (10 mg total) by mouth daily.    Dispense:  90 tablet    Refill:  1   Patient Instructions   My new practice: [facility] Address: [address], [place], [place] [address] Phone: [phone]  Here are a few lab drawing stations for you to have your labwork performed:  [facility] [address] [place], [place] [address]  [facility] [address] [place], [place] [address]  Good luck with bowling .    If you have lab work done today you will be contacted with your lab results within the next 2 weeks.  If you have not heard from [place] then please contact us. The fastest way to get your results is to register for My Chart.   IF you received an x-ray today, you will receive an invoice from [facility]. Please contact [facility] at [phone] with questions or concerns regarding your invoice.   IF you received labwork today, you will receive an invoice from [facility]. Please contact [facility] at [phone] with questions or concerns regarding your invoice.   Our billing staff will not be able to assist you with questions regarding bills from these companies.  You will be contacted with the lab results as soon as they are available. The fastest way to get your results is to activate your My Chart account. Instructions are located on the last page of this paperwork. If you have not heard from [place] regarding the results in 2 weeks, please contact this office.         Signed, Meredith Staggers, MD [facility]

## 2021-06-14 ENCOUNTER — Ambulatory Visit (INDEPENDENT_AMBULATORY_CARE_PROVIDER_SITE_OTHER): Payer: Medicare Other | Admitting: Registered Nurse

## 2021-06-14 ENCOUNTER — Other Ambulatory Visit: Payer: Self-pay

## 2021-06-14 ENCOUNTER — Encounter: Payer: Self-pay | Admitting: Registered Nurse

## 2021-06-14 VITALS — BP 82/54 | HR 45 | Temp 98.2°F | Resp 18 | Ht 61.5 in | Wt 189.2 lb

## 2021-06-14 DIAGNOSIS — H6121 Impacted cerumen, right ear: Secondary | ICD-10-CM

## 2021-07-24 ENCOUNTER — Encounter: Payer: Self-pay | Admitting: Registered Nurse

## 2021-07-24 NOTE — Progress Notes (Addendum)
Established Patient Office Visit  Subjective:  Patient ID: Steven [name], male    DOB: 18-Aug-1966  Age: 55 y.o. MRN: [id number]  CC:  Chief Complaint  Patient presents with   Hearing Problem    Patient states he is having some trouble hearing in the right ear. Patient has een having this issue for a few weeks.    HPI Steven [name] presents for muffled hearing  R ear. Ongoing a few weeks No pain or drainage. No trauma.  Has happened before, was impacted cerumen  Here today with his brother.   Past Medical History:  Diagnosis Date   Complication of anesthesia    per patient father, patient was slow to wake with a previous surgery    Down syndrome    Incisional hernia    MR (mental retardation)     Past Surgical History:  Procedure Laterality Date   HERNIA REPAIR     unaware of the year of , maybe 10 years ago    INCISIONAL HERNIA REPAIR N/A 07/17/2017   Procedure: LAPAROSCOPIC INCISIONAL HERNIA WITH MESH;  Surgeon: Abigail Miyamoto, MD;  Location: [facility];  Service: General;  Laterality: N/A;   INSERTION OF MESH N/A 07/17/2017   Procedure: INSERTION OF MESH;  Surgeon: Abigail Miyamoto, MD;  Location: [facility];  Service: General;  Laterality: N/A;    No family history on file.  Social History   Socioeconomic History   Marital status: Single    Spouse name: Not on file   Number of children: Not on file   Years of education: Not on file   Highest education level: Not on file  Occupational History   Not on file  Tobacco Use   Smoking status: Never   Smokeless tobacco: Never  Vaping Use   Vaping Use: Never used  Substance and Sexual Activity   Alcohol use: Yes    Alcohol/week: 1.0 - 2.0 standard drink    Types: 1 - 2 Glasses of wine per week   Drug use: No   Sexual activity: Not on file  Other Topics Concern   Not on file  Social History Narrative   Not on file   Social Determinants of Health   Financial Resource Strain: Not on file  Food Insecurity: Not  on file  Transportation Needs: Not on file  Physical Activity: Not on file  Stress: Not on file  Social Connections: Not on file  Intimate Partner Violence: Not on file    Outpatient Medications Prior to Visit  Medication Sig Dispense Refill   Ascorbic Acid (VITAMIN C PO) Take 1,000 mg by mouth daily.      atorvastatin (LIPITOR) 10 MG tablet Take 1 tablet (10 mg total) by mouth daily. 90 tablet 1   Cholecalciferol (VITAMIN D PO) Take 5,000 Units by mouth daily.      Multiple Vitamin (MULTIVITAMIN WITH MINERALS) TABS tablet Take 1 tablet by mouth daily.     Omega-3 Fatty Acids (OMEGA-3 CF PO) Take 1,600 mg by mouth daily.     No facility-administered medications prior to visit.    No Known Allergies  ROS Review of Systems  Constitutional: Negative.   HENT: Negative.    Eyes: Negative.   Respiratory: Negative.    Cardiovascular: Negative.   Gastrointestinal: Negative.   Genitourinary: Negative.   Musculoskeletal: Negative.   Skin: Negative.   Neurological: Negative.   Psychiatric/Behavioral: Negative.    All other systems reviewed and are negative.    Objective:  Physical Exam Constitutional:      General: He is not in acute distress.    Appearance: Normal appearance. He is normal weight. He is not ill-appearing, toxic-appearing or diaphoretic.  HENT:     Right Ear: Tympanic membrane, ear canal and external ear normal. There is impacted cerumen.     Left Ear: Tympanic membrane, ear canal and external ear normal.  Cardiovascular:     Rate and Rhythm: Normal rate and regular rhythm.     Heart sounds: Normal heart sounds. No murmur heard.   No friction rub. No gallop.  Pulmonary:     Effort: Pulmonary effort is normal. No respiratory distress.     Breath sounds: Normal breath sounds. No stridor. No wheezing, rhonchi or rales.  Chest:     Chest wall: No tenderness.  Neurological:     General: No focal deficit present.     Mental Status: He is alert and oriented to  person, place, and time. Mental status is at baseline.  Psychiatric:        Mood and Affect: Mood normal.        Behavior: Behavior normal.        Thought Content: Thought content normal.        Judgment: Judgment normal.    BP (!) 82/54   Pulse (!) 45   Temp 98.2 F (36.8 C) (Temporal)   Resp 18   Ht 5' 1.5" (1.562 m)   Wt 189 lb 3.2 oz (85.8 kg)   SpO2 99%   BMI 35.17 kg/m  Wt Readings from Last 3 Encounters:  06/14/21 189 lb 3.2 oz (85.8 kg)  01/21/21 188 lb (85.3 kg)  04/26/20 187 lb (84.8 kg)     Health Maintenance Due  Topic Date Due   COVID-19 Vaccine (4 - Booster for Pfizer series) 06/21/2020   INFLUENZA VACCINE  Never done    There are no preventive care reminders to display for this patient.  No results found for: TSH Lab Results  Component Value Date   WBC 4.6 03/09/2020   HGB 15.1 03/09/2020   HCT 44.9 03/09/2020   MCV 98 (H) 03/09/2020   PLT 248 03/09/2020   Lab Results  Component Value Date   NA 141 03/09/2020   K 4.6 03/09/2020   CO2 23 03/09/2020   GLUCOSE 80 03/09/2020   BUN 15 03/09/2020   CREATININE 1.28 (H) 03/09/2020   BILITOT 0.4 04/26/2020   ALKPHOS 71 04/26/2020   AST 29 04/26/2020   ALT 18 04/26/2020   PROT 6.5 04/26/2020   ALBUMIN 3.7 (L) 04/26/2020   CALCIUM 8.8 03/09/2020   Lab Results  Component Value Date   CHOL 193 04/26/2020   Lab Results  Component Value Date   HDL 52 04/26/2020   Lab Results  Component Value Date   LDLCALC 125 (H) 04/26/2020   Lab Results  Component Value Date   TRIG 89 04/26/2020   Lab Results  Component Value Date   CHOLHDL 3.7 04/26/2020   Lab Results  Component Value Date   HGBA1C 5.2 03/09/2020   Procedure Pt and brother consented on risks, benefits, and alternatives to lavage of ear with warm water. Questions answered and concerns addressed prior to procedure. Pt given sterile drape to cover his clothing Sterile bin held below ear while warm water flushed into ear by Steven [name]  CMA A fair amount of dark brown cerumen was able to be flushed from ear. Exam following lavage showed normal canal and normal  TM. Pt and brother counseled on return precautions and aftercare. Voiced understanding     Assessment & Plan:   Problem List Items Addressed This Visit   None Visit Diagnoses     Impacted cerumen, right ear    -  Primary       No orders of the defined types were placed in this encounter.   Follow-up: No follow-ups on file.   PLAN Impacted cerumen lavaged by Steven [name], Cma. Successful Exam following disimpacting lavage is normal. Pt reports improvement in symptoms Follow up as schedule with PCP Patient encouraged to call clinic with any questions, comments, or concerns.  Janeece Agee, NP

## 2021-10-21 ENCOUNTER — Other Ambulatory Visit: Payer: Self-pay | Admitting: Family Medicine

## 2021-10-21 DIAGNOSIS — E785 Hyperlipidemia, unspecified: Secondary | ICD-10-CM

## 2022-10-21 ENCOUNTER — Encounter (HOSPITAL_COMMUNITY): Payer: Self-pay

## 2022-10-21 ENCOUNTER — Other Ambulatory Visit: Payer: Self-pay

## 2022-10-21 ENCOUNTER — Observation Stay (HOSPITAL_COMMUNITY)
Admission: EM | Admit: 2022-10-21 | Discharge: 2022-10-22 | Disposition: A | Discharge status: Home / Self Care | Payer: Medicare Other | Attending: Internal Medicine | Admitting: Internal Medicine

## 2022-10-21 ENCOUNTER — Emergency Department (HOSPITAL_COMMUNITY): Payer: Medicare Other

## 2022-10-21 DIAGNOSIS — R42 Dizziness and giddiness: Secondary | ICD-10-CM | POA: Diagnosis not present

## 2022-10-21 DIAGNOSIS — R55 Syncope and collapse: Secondary | ICD-10-CM | POA: Diagnosis present

## 2022-10-21 DIAGNOSIS — J101 Influenza due to other identified influenza virus with other respiratory manifestations: Secondary | ICD-10-CM | POA: Insufficient documentation

## 2022-10-21 DIAGNOSIS — Z1152 Encounter for screening for COVID-19: Secondary | ICD-10-CM | POA: Insufficient documentation

## 2022-10-21 DIAGNOSIS — E785 Hyperlipidemia, unspecified: Secondary | ICD-10-CM | POA: Insufficient documentation

## 2022-10-21 DIAGNOSIS — R001 Bradycardia, unspecified: Secondary | ICD-10-CM | POA: Insufficient documentation

## 2022-10-21 LAB — COMPREHENSIVE METABOLIC PANEL
ALT: 16 U/L (ref 0–44)
AST: 45 U/L — ABNORMAL HIGH (ref 15–41)
Albumin: 2.7 g/dL — ABNORMAL LOW (ref 3.5–5.0)
Alkaline Phosphatase: 54 U/L (ref 38–126)
Anion gap: 11 (ref 5–15)
BUN: 11 mg/dL (ref 6–20)
CO2: 22 mmol/L (ref 22–32)
Calcium: 8 mg/dL — ABNORMAL LOW (ref 8.9–10.3)
Chloride: 104 mmol/L (ref 98–111)
Creatinine, Ser: 1.24 mg/dL (ref 0.61–1.24)
GFR, Estimated: >60 mL/min (ref >60)
Glucose, Bld: 101 mg/dL — ABNORMAL HIGH (ref 70–99)
Potassium: 3.7 mmol/L (ref 3.5–5.1)
Sodium: 137 mmol/L (ref 135–145)
Total Bilirubin: <0.1 mg/dL — ABNORMAL LOW (ref 0.3–1.2)
Total Protein: 6.2 g/dL — ABNORMAL LOW (ref 6.5–8.1)

## 2022-10-21 LAB — CBC WITH DIFFERENTIAL/PLATELET
Abs Immature Granulocytes: 0.01 10*3/uL (ref 0.00–0.07)
Basophils Absolute: 0 10*3/uL (ref 0.0–0.1)
Basophils Relative: 0 %
Eosinophils Absolute: 0 10*3/uL (ref 0.0–0.5)
Eosinophils Relative: 0 %
HCT: 42.2 % (ref 39.0–52.0)
Hemoglobin: 14.6 g/dL (ref 13.0–17.0)
Immature Granulocytes: 0 %
Lymphocytes Relative: 8 %
Lymphs Abs: 0.5 10*3/uL — ABNORMAL LOW (ref 0.7–4.0)
MCH: 34 pg (ref 26.0–34.0)
MCHC: 34.6 g/dL (ref 30.0–36.0)
MCV: 98.4 fL (ref 80.0–100.0)
Monocytes Absolute: 0.6 10*3/uL (ref 0.1–1.0)
Monocytes Relative: 10 %
Neutro Abs: 5.3 10*3/uL (ref 1.7–7.7)
Neutrophils Relative %: 82 %
Platelets: 175 10*3/uL (ref 150–400)
RBC: 4.29 MIL/uL (ref 4.22–5.81)
RDW: 14.6 % (ref 11.5–15.5)
WBC: 6.5 10*3/uL (ref 4.0–10.5)
nRBC: 0 % (ref 0.0–0.2)

## 2022-10-21 LAB — T4, FREE: Free T4: 0.8 ng/dL (ref 0.61–1.12)

## 2022-10-21 LAB — MAGNESIUM: Magnesium: 1.8 mg/dL (ref 1.7–2.4)

## 2022-10-21 LAB — TROPONIN I (HIGH SENSITIVITY)
Troponin I (High Sensitivity): 10 ng/L (ref <18)
Troponin I (High Sensitivity): 11 ng/L (ref <18)

## 2022-10-21 LAB — I-STAT CHEM 8, ED
BUN: 18 mg/dL (ref 6–20)
Calcium, Ion: 0.93 mmol/L — ABNORMAL LOW (ref 1.15–1.40)
Chloride: 107 mmol/L (ref 98–111)
Creatinine, Ser: 1 mg/dL (ref 0.61–1.24)
Glucose, Bld: 110 mg/dL — ABNORMAL HIGH (ref 70–99)
HCT: 44 % (ref 39.0–52.0)
Hemoglobin: 15 g/dL (ref 13.0–17.0)
Potassium: 5.3 mmol/L — ABNORMAL HIGH (ref 3.5–5.1)
Sodium: 137 mmol/L (ref 135–145)
TCO2: 23 mmol/L (ref 22–32)

## 2022-10-21 LAB — TSH: TSH: 2.726 u[IU]/mL (ref 0.350–4.500)

## 2022-10-21 LAB — RESP PANEL BY RT-PCR (RSV, FLU A&B, COVID)  RVPGX2
Influenza A by PCR: POSITIVE — AB
Influenza B by PCR: NEGATIVE
Resp Syncytial Virus by PCR: NEGATIVE
SARS Coronavirus 2 by RT PCR: NEGATIVE

## 2022-10-21 LAB — HIV ANTIBODY (ROUTINE TESTING W REFLEX): HIV Screen 4th Generation wRfx: NONREACTIVE

## 2022-10-21 MED ORDER — LACTATED RINGERS IV BOLUS
1000.0000 mL | Freq: Once | INTRAVENOUS | Status: AC
  Administered 2022-10-21: 1000 mL via INTRAVENOUS

## 2022-10-21 MED ORDER — POTASSIUM CHLORIDE CRYS ER 20 MEQ PO TBCR
40.0000 meq | EXTENDED_RELEASE_TABLET | Freq: Once | ORAL | Status: AC
  Administered 2022-10-21: 40 meq via ORAL
  Filled 2022-10-21: qty 2

## 2022-10-21 MED ORDER — BISACODYL 5 MG PO TBEC: 5.0000 mg | DELAYED_RELEASE_TABLET | Freq: Every day | ORAL | Status: DC | PRN

## 2022-10-21 MED ORDER — MAGNESIUM SULFATE 2 GM/50ML IV SOLN
2.0000 g | Freq: Once | INTRAVENOUS | Status: AC
  Administered 2022-10-21: 2 g via INTRAVENOUS
  Filled 2022-10-21: qty 50

## 2022-10-21 MED ORDER — ENOXAPARIN SODIUM 40 MG/0.4ML IJ SOSY
40.0000 mg | PREFILLED_SYRINGE | INTRAMUSCULAR | Status: DC
  Administered 2022-10-21: 40 mg via SUBCUTANEOUS
  Filled 2022-10-21: qty 0.4

## 2022-10-21 MED ORDER — SENNOSIDES-DOCUSATE SODIUM 8.6-50 MG PO TABS: 1.0000 | ORAL_TABLET | Freq: Every evening | ORAL | Status: DC | PRN

## 2022-10-21 NOTE — ED Triage Notes (Signed)
Pt arrived via [facility] from a nail salon w/family. Pt has MR. Pt was at nail salon felt dizzy and lightheaded and had a syncopal episode. Pt has had mild nasal congestion, cough, fatigue. Pt went pale before syncope. Per EMS bp systolic was 110 on palpation. EMS gave NS 500 mL. Pt had a loose  BM on bedside commode shortly after getting here.

## 2022-10-21 NOTE — ED Notes (Signed)
Dr Criss Alvine notified pt hypotensive. Orders already placed and implemented

## 2022-10-21 NOTE — ED Provider Notes (Signed)
[facility] Provider Note   CSN: [id number] Arrival date & time: 10/21/22  1455     History  Chief Complaint  Patient presents with   Loss of Consciousness    Sedalia MutaGerhard Benningfield is a 56 y.o. male.  Past medical history of Down syndrome, MR, and incisional hernia.  Lives at home with family.  Here with a syncopal event.  Was at the nail salon with his parents this is and had a syncopal event lasting for around 2 minutes.  Reportedly he was dizzy and a little bit pale right before the event, and fell into he fell the ground, not hitting his head.  Did not have seizure-like activity.  Never had a history of seizure or syncope before.  Was down on the ground but not truly unconscious, just moaning and took a couple minutes for him to come back to baseline.  With EMS blood pressure had responded to about 106 systolic and heart rate in the 90s.  Currently patient is feeling much better.  He has had some loose stools, 5 over the past 2 days, as well as a little bit of nasal congestion and cough.  Currently, the patient is not complaining of any pain, denies chest pain, shortness of breath, abdominal pain, nausea.  Has not had any recent vomiting.  Did not eat breakfast or did really drink much this morning.  HPI     Home Medications Prior to Admission medications   Medication Sig Start Date End Date Taking? Authorizing Provider  Ascorbic Acid (VITAMIN C PO) Take 1,000 mg by mouth daily.     [provider]  atorvastatin (LIPITOR) 10 MG tablet TAKE ONE TABLET BY MOUTH ONE TIME DAILY 10/25/21   Shade FloodGreene, Jeffrey R, MD  Cholecalciferol (VITAMIN D PO) Take 5,000 Units by mouth daily.     [provider]  Multiple Vitamin (MULTIVITAMIN WITH MINERALS) TABS tablet Take 1 tablet by mouth daily.    [provider]  Omega-3 Fatty Acids (OMEGA-3 CF PO) Take 1,600 mg by mouth daily.    [provider]      Allergies    Patient has no  known allergies.    Review of Systems   Review of Systems  Physical Exam Updated Vital Signs BP 119/87   Pulse (!) 44   Temp 98.4 F (36.9 C)   Resp (!) 26   Ht 5' 1.5" (1.562 m)   Wt 85.8 kg   SpO2 96%   BMI 35.16 kg/m  Physical Exam Vitals and nursing note reviewed.  Constitutional:      General: He is not in acute distress.    Appearance: He is well-developed.  HENT:     Head: Normocephalic and atraumatic.     Right Ear: External ear normal.     Left Ear: External ear normal.     Nose: Nose normal.     Mouth/Throat:     Mouth: Mucous membranes are dry.     Pharynx: Oropharynx is clear. No oropharyngeal exudate or posterior oropharyngeal erythema.  Eyes:     General: No scleral icterus.    Conjunctiva/sclera: Conjunctivae normal.  Cardiovascular:     Rate and Rhythm: Normal rate and regular rhythm.     Pulses: Normal pulses.     Heart sounds: Normal heart sounds. No murmur heard. Pulmonary:     Effort: Pulmonary effort is normal. No respiratory distress.     Breath sounds: Normal breath sounds. No stridor. No  wheezing, rhonchi or rales.  Abdominal:     Palpations: Abdomen is soft.     Tenderness: There is no abdominal tenderness. There is no guarding or rebound.  Musculoskeletal:        General: No swelling, deformity or signs of injury.     Cervical back: Normal range of motion and neck supple. No tenderness.     Right lower leg: No edema.     Left lower leg: No edema.  Skin:    General: Skin is warm and dry.     Capillary Refill: Capillary refill takes less than 2 seconds.  Neurological:     General: No focal deficit present.     Mental Status: He is alert and oriented to person, place, and time.     Cranial Nerves: No cranial nerve deficit.     Sensory: No sensory deficit.     Motor: No weakness.     Gait: Gait normal.  Psychiatric:        Mood and Affect: Mood normal.     ED Results / Procedures / Treatments   Labs (all labs ordered are listed,  but only abnormal results are displayed) Labs Reviewed  RESP PANEL BY RT-PCR (RSV, FLU A&B, COVID)  RVPGX2 - Abnormal; Notable for the following components:      Result Value   Influenza A by PCR POSITIVE (*)    All other components within normal limits  CBC WITH DIFFERENTIAL/PLATELET - Abnormal; Notable for the following components:   Lymphs Abs 0.5 (*)    All other components within normal limits  COMPREHENSIVE METABOLIC PANEL - Abnormal; Notable for the following components:   Glucose, Bld 101 (*)    Calcium 8.0 (*)    Total Protein 6.2 (*)    Albumin 2.7 (*)    AST 45 (*)    Total Bilirubin <0.1 (*)    All other components within normal limits  I-STAT CHEM 8, ED - Abnormal; Notable for the following components:   Potassium 5.3 (*)    Glucose, Bld 110 (*)    Calcium, Ion 0.93 (*)    All other components within normal limits  MAGNESIUM  T4, FREE  TSH  HIV ANTIBODY (ROUTINE TESTING W REFLEX)  BASIC METABOLIC PANEL  MAGNESIUM  TSH  TROPONIN I (HIGH SENSITIVITY)  TROPONIN I (HIGH SENSITIVITY)    EKG EKG Interpretation  Date/Time:  Saturday October 21 2022 15:39:36 EST Ventricular Rate:  56 PR Interval:    QRS Duration: 85 QT Interval:  411 QTC Calculation: 397 R Axis:   -27 Text Interpretation: regular rhythm but no p waves Borderline left axis deviation Low voltage, precordial leads Abnormal T, consider ischemia, diffuse leads No old tracing to compare Confirmed by Pricilla Loveless [phone]) on 10/21/2022 3:45:05 PM  Radiology DG Chest Portable 1 View  Result Date: 10/21/2022 CLINICAL DATA:  Syncope. Lightheadedness. Patient also with nasal congestion and cough. EXAM: PORTABLE CHEST 1 VIEW COMPARISON:  12/08/2013. FINDINGS: Cardiac silhouette is normal in size. No mediastinal or hilar masses. Clear lungs.  No pleural effusion or pneumothorax. Skeletal structures are grossly intact. IMPRESSION: No active disease. Electronically Signed   By: Amie Portland M.D.   On:  10/21/2022 15:52    Procedures Procedures    Medications Ordered in ED Medications  enoxaparin (LOVENOX) injection 40 mg (has no administration in time range)  senna-docusate (Senokot-S) tablet 1 tablet (has no administration in time range)  bisacodyl (DULCOLAX) EC tablet 5 mg (has no administration in  time range)  lactated ringers bolus 1,000 mL (0 mLs Intravenous Stopped 10/21/22 1651)  lactated ringers bolus 1,000 mL (0 mLs Intravenous Stopped 10/21/22 1938)  potassium chloride SA (KLOR-CON M) CR tablet 40 mEq (40 mEq Oral Given 10/21/22 2044)  magnesium sulfate IVPB 2 g 50 mL (2 g Intravenous New Bag/Given 10/21/22 2047)    ED Course/ Medical Decision Making/ A&P                           Medical Decision Making Problems Addressed: Junctional bradycardia: acute illness or injury that poses a threat to life or bodily functions Syncope and collapse: acute illness or injury that poses a threat to life or bodily functions  Amount and/or Complexity of Data Reviewed Independent Historian: parent and EMS Labs: ordered. Decision-making details documented in ED Course. Radiology: ordered and independent interpretation performed. Decision-making details documented in ED Course. ECG/medicine tests: ordered and independent interpretation performed. Decision-making details documented in ED Course.  Risk Prescription drug management. Decision regarding hospitalization.   Atley Neubert is a 56 y.o. male with significant PMHx for down syndrome, MR, and hernia who presented to the ED with a syncopal episode.   EKG: there are no previous tracings available for comparison, bradycardia with what appears to be a junctional rhythm.  There are no obvious P waves.  Repeat EKG shortly after the first 1 shows occasional P waves, but mostly junctional beats.  There are a couple of ST depressions, on the initial EKG there is slight ST elevation in aVR and ST depressions throughout, but on the repeat  EKG there are just small ST depressions in the inferior leads.  There is no prior EKG to compare to.  The patient does not have any active chest pain or shortness of breath.CXR: No acute abnormality without signs of pneumonia, pneumothorax, pleural effusion, widened mediastinum.  Glucose: 110. Basic labs performed, as above.  Significant for initially appeared to be hyperkalemic, but the specimen was hemolyzed, on repeat draw his potassium is 3.7 otherwise, troponin is normal, magnesium is slightly low at 1.8.  Will replace magnesium and potassium to a goal of K greater than 4 and mag greater than 2.  Doubt Adrenal insufficiency, Hypoglycemia, Hyponatremia, PE, cerebral ischemia, or ingestion.  Patient HDS upon arrival to the ED, with borderline hypotension at 98/58 with improvement since syncopal episode. Given abnormality seen on ECG, cardiology was consulted.  They agree that he looks to have a junctional rhythm with intermittent P waves that are conducting.  Without any prior ECGs to compare to, they are recommending admission to the hospitalist service, they will consult in the morning and get an echo.  In the meantime, recommending thyroid labs to be obtained, these were ordered.  Discussed the care with the admitting hospitalist team.  He was given a total of 2 L of IV fluids during his time in the emergency department with adequate blood pressure response now up to normal blood pressure. Patient admitted to the floor in stable condition.   The plan for this patient was discussed with Dr. Criss Alvine, who voiced agreement and who oversaw evaluation and treatment of this patient.          Final Clinical Impression(s) / ED Diagnoses Final diagnoses:  Syncope and collapse  Junctional bradycardia    Rx / DC Orders ED Discharge Orders     None         Gust Brooms, MD 10/21/22 2200  Pricilla Loveless, MD 10/21/22 2249

## 2022-10-21 NOTE — ED Notes (Signed)
Provider at bedside

## 2022-10-21 NOTE — H&P (Cosign Needed Addendum)
Date: 10/21/2022               Patient Name:  Steven [name] MRN: [id number]  DOB: 1966-06-25 Age / Sex: 56 y.o., male   PCP: Wendie Agreste, MD         Medical Service: Internal Medicine Teaching Service         Attending Physician: Dr. Charise Killian, MD    First Contact: Dr. Claudia Desanctis Sidonia Nutter Pager: [phone]  Second Contact: Dr. Idamae Schuller Pager: [phone]       After Hours (After 5p/  First Contact Pager: [phone]  weekends / holidays): Second Contact Pager: [phone]   Chief Complaint: Presyncope   History of Present Illness:   Steven [name] is a 56 y/o male with past medical history of Down syndrome, HLD, and incisional hernia that presents after presyncopal episode today.  Mother and father report that they were at a nail salon when the presyncopal episode occurred.  They state that the patient stood up from his chair quickly and then proceeded to fall to the ground.  The patient states that he felt dizzy upon standing prior to falling.  Per the family, the patient did not hit his head and did not appear to lose consciousness.  They state that the patient remained on the ground for a few minutes and was mumbling responses to their questions.  Family denies witnessing seizure-like activity.  Patient denies having any chest pain or palpitations prior to falling.  Patient has never experienced a presyncopal episode before.  Patient and family deny history of seizures. Patient is not complaining of pain from the fall at this time.   Family reports that the patient has been experiencing subjective fever, cough productive of yellow sputum, and diarrhea over the last 2-3 days.  Patient also reports decreased appetite and nausea over this period of time.  Patient denies chills, vomiting, dark stools, hematochezia, dysuria, urinary frequency, headache, vision changes, localized weakness, or difficulty speaking.  Family states that the patient follows up with his primary care doctor (Dr.  Carlota Raspberry) for regularly.   ED Course:  -I-stat chem significant for hyperkalemia at 5.3 and low ionized calcium of 0.93 -CMP significant for mildly elevated AST of 45, low albumin at 2.7, and resolution of hyperkalemia -CBC unremarkable -Initial and repeat troponin WNL -Magnesium WNL -RVP positive for influenza A -CXR negative for acute abnormality -EKG significant for sinus bradycardia, premature atrial complexes, low voltage -2g IV magnesium administered -40 mEq PO potassium chloride administered -2L LR bolus administered -Cardiology consulted  Meds:  -Family reports that the patient does not take any medications daily. No outpatient medications have been marked as taking for the 10/21/22 encounter [facility]counter).     Allergies: Allergies as of 10/21/2022   (No Known Allergies)   Past Medical History:  Diagnosis Date   Complication of anesthesia    per patient father, patient was slow to wake with a previous surgery    Down syndrome    Incisional hernia    MR (mental retardation)    Family History: unknown cancer (maternal aunt), diabetes (maternal aunt).  Social History: Patient lives at home with his mother and father.  He works 1 day a week at a [employer].  No current or prior tobacco use.  Patient drinks approximately 3 beers per week.  No current or prior recreational drug use.  Review of Systems: A complete ROS was negative except as per HPI.   Physical Exam: Blood pressure  96/82, pulse (!) 44, temperature 98.4 F (36.9 C), resp. rate (!) 27, height 5' 1.5" (1.562 m), weight 85.8 kg, SpO2 96 %. Constitutional: Well-developed, well-nourished, appears comfortable, talkative, smiling  HENT: Normocephalic and atraumatic.  Eyes: EOMI. PERRL.  Neck: Normal range of motion.  Cardiovascular: Bradycardic, regular rhythm. No murmurs, rubs, or gallops. Normal radial and PT pulses bilaterally. No LE edema.  Pulmonary: Normal respiratory effort. No wheezes,  rales, or rhonchi.   Abdominal: Soft. Non-distended. Normal bowel sounds. Mild LUQ/RUQ tenderness. No guarding.   Musculoskeletal: Normal range of motion.     Neurological: Alert and oriented to person, place, and time. No loss of sensation. 5/5 strength in all extremities. Normal finger-to-nose bilaterally.  Skin: warm and dry.    EKG: personally reviewed my interpretation is sinus bradycardia, premature atrial complexes, low voltage  CXR: personally reviewed my interpretation is no acute abnormality.   Assessment & Plan by Problem: Principal Problem:   Postural dizziness with presyncope Active Problems:   Sinus bradycardia  Steven [name] is a 56 y/o male with past medical history of Down syndrome, HLD, and incisional hernia that presents after presyncopal episode today and was admitted for further evaluation of presyncope and bradycardia.   #Presyncope #Bradycardia Patient presents after a presyncopal event this afternoon.  Presyncopal event occurred after abruptly standing and had prodrome of dizziness.  No chest pain or palpitations prior to the presyncopal episode. No seizure-like activity.  No history of seizures. Patient did not hit his head. No other neurological complaints.  Neurological exam is normal.  Low suspicion for seizure at this time. Patient also presented with cough, nausea, diarrhea, and decreased PO intake over the last few days. Patient is positive for influenza A. He is afebrile with no leukocytosis.  Blood pressure was 98/58 on admission. 2L LR bolus administered in the ED with BP now improved to 119/87. Suspect his presyncope is likely secondary to orthostatic hypotension in the setting of diarrhea, decreased PO intake, and influenza infection.  Patient has been bradycardic between the 40s-50s since admission.  EKG demonstrates sinus bradycardia with PACs and low voltage. Initial and repeat troponin WNL. Cardiology was consulted by the ED provider and will see the  patient tomorrow.  Plan for echo tomorrow. Will also obtain thyroid studies to rule out thyroid dysfunction as a cause of his presyncope and bradycardia.  -F/u cardiology recommendations -F/u echo -F/u TSH -Telemetry  -Orthostatic vital signs -Delirium precautions  #Influenza A #Diarrhea Patient presented with a few days of productive cough, nausea, and diarrhea. Patient received his flu shot 2-3 weeks ago. He is positive for influenza A.  He is afebrile without leukocytosis. CXR negative. Currently satting well on room air.  Low suspicion for coexisting pneumonia at this time. Will not give Tamiflu at this time given that he is out of the treatment window.  -Monitor SpO2  #Hyperkalemia - resolved #Hypocalcemia Hyperkalemic at 5.3 on admission. CMP following demonstrated potassium normalized at 3.7.  Patient was given 40 mEq PO potassium while in the ED.  Magnesium at the lower limit of normal at 1.8. 2g IV magnesium administered in the ED.  Low ionized calcium of 0.93 on admission. Per his chart, patient is taking 5000 units vitamin D daily at home.  Family reports that the patient takes no medications regularly.  Will need to reassess home medication regimen in the morning. Holding vitamin D for now.  -Trend BMP -Trend Magnesium  #HLD Per his chart, patient is taking atorvastatin 10 mg  daily.  Lipid panel from 2 years ago demonstrates elevated LDL at 125. Family reports that the patient takes no medications regularly.  Will need to reassess home medication regimen in the morning. Holding atorvastatin for now. Patient would likely benefit from outpatient lipid panel.   Diet: Regular Bowel: PRN dulcolax/Senna VTE: Lovenox IVF: none Code: FULL   Prior to Admission Living Arrangement: at home with mother and father Anticipated Discharge Location: TBD Barriers to Discharge: continued management  Dispo: Admit patient to Observation with expected length of stay less than 2  midnights.  Signed: Karoline Caldwell, MD 10/21/2022, 11:01 PM  Pager: [phone] After 5pm on weekdays and 1pm on weekends: On Call pager: [phone]

## 2022-10-22 ENCOUNTER — Other Ambulatory Visit: Payer: Self-pay | Admitting: Physician Assistant

## 2022-10-22 ENCOUNTER — Observation Stay (HOSPITAL_BASED_OUTPATIENT_CLINIC_OR_DEPARTMENT_OTHER): Payer: Medicare Other

## 2022-10-22 DIAGNOSIS — I351 Nonrheumatic aortic (valve) insufficiency: Secondary | ICD-10-CM | POA: Diagnosis not present

## 2022-10-22 DIAGNOSIS — R001 Bradycardia, unspecified: Secondary | ICD-10-CM

## 2022-10-22 DIAGNOSIS — R55 Syncope and collapse: Secondary | ICD-10-CM

## 2022-10-22 DIAGNOSIS — R42 Dizziness and giddiness: Secondary | ICD-10-CM | POA: Diagnosis not present

## 2022-10-22 LAB — ECHOCARDIOGRAM COMPLETE
AR max vel: 1.82 cm2
AV Area VTI: 2.02 cm2
AV Area mean vel: 1.67 cm2
AV Mean grad: 5 mmHg
AV Peak grad: 10 mmHg
Ao pk vel: 1.58 m/s
Height: 61.5 in
P 1/2 time: 476 msec
S' Lateral: 2.6 cm
Weight: 3026.47 oz

## 2022-10-22 LAB — BASIC METABOLIC PANEL
Anion gap: 5 (ref 5–15)
BUN: 10 mg/dL (ref 6–20)
CO2: 28 mmol/L (ref 22–32)
Calcium: 7.9 mg/dL — ABNORMAL LOW (ref 8.9–10.3)
Chloride: 107 mmol/L (ref 98–111)
Creatinine, Ser: 1.27 mg/dL — ABNORMAL HIGH (ref 0.61–1.24)
GFR, Estimated: >60 mL/min (ref >60)
Glucose, Bld: 88 mg/dL (ref 70–99)
Potassium: 4.3 mmol/L (ref 3.5–5.1)
Sodium: 140 mmol/L (ref 135–145)

## 2022-10-22 LAB — LIPID PANEL
Cholesterol: 253 mg/dL — ABNORMAL HIGH (ref 0–200)
HDL: 50 mg/dL (ref >40)
LDL Cholesterol: 181 mg/dL — ABNORMAL HIGH (ref 0–99)
Total CHOL/HDL Ratio: 5.1 RATIO
Triglycerides: 110 mg/dL (ref <150)
VLDL: 22 mg/dL (ref 0–40)

## 2022-10-22 LAB — TSH: TSH: 4.914 u[IU]/mL — ABNORMAL HIGH (ref 0.350–4.500)

## 2022-10-22 LAB — MAGNESIUM: Magnesium: 2.3 mg/dL (ref 1.7–2.4)

## 2022-10-22 LAB — VITAMIN D 25 HYDROXY (VIT D DEFICIENCY, FRACTURES): Vit D, 25-Hydroxy: 64.42 ng/mL (ref 30–100)

## 2022-10-22 MED ORDER — LACTATED RINGERS IV BOLUS: 500.0000 mL | Freq: Once | INTRAVENOUS | Status: DC

## 2022-10-22 MED ORDER — ACETAMINOPHEN 325 MG PO TABS: 650.0000 mg | ORAL_TABLET | Freq: Four times a day (QID) | ORAL | Status: DC | PRN

## 2022-10-22 NOTE — Progress Notes (Signed)
Per Dr. Verna Czech recommendation, I have sent a message to monitor staff and schedule to arrange 2-week ZIO monitor and 6 weeks follow-up with Dr. Tomie China in [facility].

## 2022-10-22 NOTE — Consult Note (Signed)
Cardiology Consultation   Patient ID: Steven [name] MRN: [id number]; DOB: 16-Dec-1965  Admit date: 10/21/2022 Date of Consult: 10/22/2022  PCP:  Shade Flood, MD   [facility] Providers Cardiologist:  None   {      Patient Profile:   Steven [name] is a 56 y.o. male with a hx of Downs syndrome  who is being seen 10/22/2022 for the evaluation of bradcardia  at the request of Dr Nooruddin.  History of Present Illness:   [name] 56 yo male history of Downs syndrome, HLD presents with presyncopal episode.   From notes episode occurred at nail salon. Patient stood up from chair quickly and then fell to the ground, no LOC. No witnessed seizure like activity. Patient had been having subjective fevers, productive cough, diarrhea x 2-3 days. Decreased appetite/oral intake. Flu + in ER.    ER vitals 98/58 p 71 WBC 6.5 Hgb 14.6 Plt 175 K 3.7 Cr 1.24 BUN 11 Mg 1.8 Free T4 0.80 TSH 2.7 Influenza A + Trop 10-->11 CXR no acute process EKG junctional rhythm 56, retrograde P wave after QRS  Echo: LVEF 60-65%, no WMAs, indet diasotlic,   Past Medical History:  Diagnosis Date   Complication of anesthesia    per patient father, patient was slow to wake with a previous surgery    Down syndrome    Incisional hernia    MR (mental retardation)     Past Surgical History:  Procedure Laterality Date   HERNIA REPAIR     unaware of the year of , maybe 10 years ago    INCISIONAL HERNIA REPAIR N/A 07/17/2017   Procedure: LAPAROSCOPIC INCISIONAL HERNIA WITH MESH;  Surgeon: Abigail Miyamoto, MD;  Location: [facility];  Service: General;  Laterality: N/A;   INSERTION OF MESH N/A 07/17/2017   Procedure: INSERTION OF MESH;  Surgeon: Abigail Miyamoto, MD;  Location: [facility];  Service: General;  Laterality: N/A;       Inpatient Medications: Scheduled Meds:  enoxaparin (LOVENOX) injection  40 mg Subcutaneous Q24H   Continuous Infusions:  lactated ringers     PRN  Meds: acetaminophen, bisacodyl, senna-docusate  Allergies:   No Known Allergies  Social History:   Social History   Socioeconomic History   Marital status: Single    Spouse name: Not on file   Number of children: Not on file   Years of education: Not on file   Highest education level: Not on file  Occupational History   Not on file  Tobacco Use   Smoking status: Never   Smokeless tobacco: Never  Vaping Use   Vaping Use: Never used  Substance and Sexual Activity   Alcohol use: Yes    Alcohol/week: 1.0 - 2.0 standard drink of alcohol    Types: 1 - 2 Glasses of wine per week   Drug use: No   Sexual activity: Not on file  Other Topics Concern   Not on file  Social History Narrative   Not on file   Social Determinants of Health   Financial Resource Strain: Not on file  Food Insecurity: Not on file  Transportation Needs: Not on file  Physical Activity: Not on file  Stress: Not on file  Social Connections: Not on file  Intimate Partner Violence: Not on file    Family History:   History reviewed. No pertinent family history.   ROS:  Please see the history of present illness.   All other ROS reviewed and negative.  Physical Exam/Data:   Vitals:   10/21/22 2348 10/22/22 0016 10/22/22 0527 10/22/22 0805  BP:  91/63 104/74 107/72  Pulse: (!) 46 81 (!) 51 69  Resp: (!) 23 18 16 17   Temp:  98.3 F (36.8 C) 98.2 F (36.8 C) (!) 100.5 F (38.1 C)  TempSrc:  Oral Oral Oral  SpO2: 96% 99% 97% 95%  Weight:      Height:        Intake/Output Summary (Last 24 hours) at 10/22/2022 1313 Last data filed at 10/22/2022 1000 Gross per 24 hour  Intake 1340 ml  Output --  Net 1340 ml      10/21/2022    3:23 PM 06/14/2021    3:09 PM 01/21/2021   11:35 AM  Last 3 Weights  Weight (lbs) 189 lb 2.5 oz 189 lb 3.2 oz 188 lb  Weight (kg) 85.8 kg 85.821 kg 85.276 kg     Body mass index is 35.16 kg/m.  General:  Well nourished, well developed, in no acute  distress HEENT: normal Neck: no JVD Vascular: No carotid bruits; Distal pulses 2+ bilaterally Cardiac:  normal S1, S2; RRR; no murmur  Lungs:  clear to auscultation bilaterally, no wheezing, rhonchi or rales  Abd: soft, nontender, no hepatomegaly  Ext: no edema Musculoskeletal:  No deformities, BUE and BLE strength normal and equal Skin: warm and dry  Neuro:  CNs 2-12 intact, no focal abnormalities noted Psych:  Normal affect     Laboratory Data:  High Sensitivity Troponin:   Recent Labs  Lab 10/21/22 1545 10/21/22 1645  TROPONINIHS 10 11     Chemistry Recent Labs  Lab 10/21/22 1602 10/21/22 1645 10/22/22 0227  NA 137 137 140  K 5.3* 3.7 4.3  CL 107 104 107  CO2  --  22 28  GLUCOSE 110* 101* 88  BUN 18 11 10   CREATININE 1.00 1.24 1.27*  CALCIUM  --  8.0* 7.9*  MG  --  1.8 2.3  GFRNONAA  --  >60 >60  ANIONGAP  --  11 5    Recent Labs  Lab 10/21/22 1645  PROT 6.2*  ALBUMIN 2.7*  AST 45*  ALT 16  ALKPHOS 54  BILITOT <0.1*   Lipids  Recent Labs  Lab 10/22/22 0819  CHOL 253*  TRIG 110  HDL 50  LDLCALC 181*  CHOLHDL 5.1    Hematology Recent Labs  Lab 10/21/22 1528 10/21/22 1602  WBC 6.5  --   RBC 4.29  --   HGB 14.6 15.0  HCT 42.2 44.0  MCV 98.4  --   MCH 34.0  --   MCHC 34.6  --   RDW 14.6  --   PLT 175  --    Thyroid  Recent Labs  Lab 10/21/22 2110 10/22/22 0227  TSH 2.726 4.914*  FREET4 0.80  --     BNPNo results for input(s): "BNP", "PROBNP" in the last 168 hours.  DDimer No results for input(s): "DDIMER" in the last 168 hours.   Radiology/Studies:  DG Chest Portable 1 View  Result Date: 10/21/2022 CLINICAL DATA:  Syncope. Lightheadedness. Patient also with nasal congestion and cough. EXAM: PORTABLE CHEST 1 VIEW COMPARISON:  12/08/2013. FINDINGS: Cardiac silhouette is normal in size. No mediastinal or hilar masses. Clear lungs.  No pleural effusion or pneumothorax. Skeletal structures are grossly intact. IMPRESSION: No active  disease. Electronically Signed   By: 10/23/2022 M.D.   On: 10/21/2022 15:52     Assessment and Plan:  1.Presyncope/bradycardia - with diarrhea, poor oral intake, flu +,  episode occurring after standing history would suggest orthostatic syncope - has had some low bp's 80s-90s/50s.  -orthostatics last night at 1030pm normal by bp, HR increase 21 beats. This was after 2L total bolus of lactated ringers.  - has had some bradycardia. Tele reviews junctional rhythm to high 30s but typically junctional in the 50s, SR in the 60s. Isolated 3.7 s nocturnal pause.  - he is not on any av nodal agents. Initial TSH was normal though repeat elevated, free T4 normal - prior clinic notes in 2021 list HRs 38, 45, 45. As far back as 2018 HR 50  - I think presyncope was orthostatic in nature. Has chronic bradycardia/SA node dysfunction that I don't believed played a role. Can plan for 2 week zio patch at [facility] and outpatient cardiology f/u.    2. Influenza A - per primary team  [facility] for discharge from cardiac standpoint. We will arrange 2 week zio patch and outpatient f/u. Father sees Dr Tomie China in [place] and would like son to follow up there.     For questions or updates, please contact [facility] Please consult [email or web address] for contact info under    Signed, Dina Rich, MD  10/22/2022 1:13 PM

## 2022-10-22 NOTE — Discharge Instructions (Addendum)
It was a pleasure meeting you. You were brought to the hospital after a fall, likely due to dehydration because of the flu symptoms you've been having with the diarrhea. It's very important you stay hydrated!   For your low heart rate, the heart doctors are recommending a monitor for two weeks, and then following up with them. They will arrange follow up for you, and will mail the monitor to you.   For the flu, you can treat symptoms with tylenol and fluids. Please make an appointment with your primary care provider as soon as you can for follow up.   Merry Christmas and Happy Holidays!

## 2022-10-22 NOTE — Progress Notes (Signed)
  Echocardiogram 2D Echocardiogram has been performed.  Gerda Diss 10/22/2022, 1:12 PM

## 2022-10-22 NOTE — Hospital Course (Addendum)
12/24: he says he feels better. A little dizzy. His mom says he looks like he is doing better, eating a drinking better, napping. Seems to be more like himself today. He walked last night and did okay.she says he's doing pretty good. When asked what happened, he says he fell. He felt bad before he fell. He felt like the room was spinning. Has been eating normal, drinking normal. Has been having diarrhea for last few days--more BM than normal.  He snores and has for a long time. Has never had a sleep study and doesn't have CPAP.      Hospital Course:   #Syncopal Episode #Bradycardia  Pt presented after a presyncopal episode yesterday afternoon. He stated he felt like the room was spinning around him, and did not lose consciousness. His vitals have been stable, and he is not orthostatic. He is complaining of dizziness on laying down and during ambulation, however could be in the setting of his current influenza infection. Syncopal episode likely in the setting of increased diarrhea and hypovolemia secondary to flu. Bradycardia was noted on vital signs at previous PCP visits that his pulses were in the high 30s and low 40s, however does not seem like it was worked up. Cardiology was consulted and recommended getting an Echo, echo currently pending, as well as cards recs pending. EKG shows junctional rhythm with increased P waves.   #Influenza A Infection Pt positive for influenza, and currently febrile with a temperature of 100.5. He was given 2L of of fluids in the emergency department. Chest X-Ray negative for pneumonia or infiltrates. Hypotension seems to have resolved, as most recent vital set show BP of 107/72, and pulse of 69. However, given pt conitnuing to be dizzy at rest and ambulation, will give a bolus of LR.   Plan:  - bolus  - Monitor vital signs.    #Hyperlipidemia  Lipid panel done, revealed a cholesterol of 253, and LDL of 181. Pt at home was supposed to be taking  atorvastatin 10mg , however per parents has not been taking it.  ASCVD risk is 6.8%, making moderate intesnity statin recommended for him. Will encourage compliance with his medication.

## 2022-10-22 NOTE — Discharge Summary (Addendum)
Name: Steven [name] MRN: [id number] DOB: 19-Jan-1966 56 y.o. PCP: Shade Flood, MD  Date of Admission: 10/21/2022  2:55 PM Date of Discharge: 10/22/2022 Attending Physician: Dickie La, MD  Discharge Diagnosis: 1. Principal Problem:   Postural dizziness with presyncope Active Problems:   Sinus bradycardia    Discharge Medications: Allergies as of 10/22/2022   No Known Allergies      Medication List     STOP taking these medications    Mucinex 600 MG 12 hr tablet Generic drug: guaiFENesin       TAKE these medications    atorvastatin 10 MG tablet Commonly known as: LIPITOR TAKE ONE TABLET BY MOUTH ONE TIME DAILY   multivitamin with minerals Tabs tablet Take 1 tablet by mouth daily.   OMEGA-3 CF PO Take 1,600 mg by mouth daily.   VITAMIN C PO Take 1,000 mg by mouth daily.   VITAMIN D PO Take 5,000 Units by mouth daily.        Disposition and follow-up:   Steven [name] was discharged from [facility] in Good condition.  At the hospital follow up visit please address:  1.  Cards recommended zio patch for two weeks. STOP-BANG score is 5, medium/high risk for OSA, would benefit from sleep study given bradycardia. Lipids panel done today shows ASCVD risk of 6.8%, and he is not taking atorvastatin, would recommend consideration of re-initiation of this medication for prevention. Initial TSH/fT4 within normal limits, repeat several hours later showed mildly elevated TSH. Would recommend repeat in the outpatient setting after resolution of current illness.  2.  Labs / imaging needed at time of follow-up: BMP/CBC, TSH/fT4  3.  Pending labs/ test needing follow-up: N/A  Follow-up Appointments:   Hospital Course by problem list:  #Pre-Syncopal Episode #Bradycardia  Pt presented after a presyncopal episode yesterday afternoon. He stated he felt like the room was spinning around him, and did not lose consciousness. His vitals have been  stable, and he is not orthostatic. He is complaining of dizziness on laying down and during ambulation, however could be in the setting of his current influenza infection. Syncopal episode likely in the setting of increased diarrhea and hypovolemia secondary to flu. Bradycardia was noted on vital signs at previous PCP visits that his pulses were in the high 30s and low 40s, however does not seem like it was worked up. EKG with junctional rhythm with retrograde P waves per cardiology review. Sinus pauses also seen on telemetry review. Cardiology recommendations are to have zio patch for 2 weeks, and then outpatient follow up with them. Echo revealed an EF of 60-65% with no regional wall abnormalities and normal function of LV. Zio patch will be mailed to patient's house, and they will arrange follow up.     Pt's mother reports that he snores very loudly, and gets fatigued during the day. He has never had a sleep study done before,  and STOP-BANG score is 5, which puts him at moderate/high risk for sleep apnea. Could be an explanation for bradycardia, and will need to be evaluated in the outpatient setting.      #Influenza A Infection Pt positive for influenza, and mild fever to 100.5 during admission. He was given 2L of of fluids in the emergency department. Chest X-Ray negative for pneumonia or infiltrates. Hypotension seems to have resolved, as most recent vital set show BP of 107/72, and pulse of 69. On initial AM evaluation, patient reporting continued dizziness with rest and  ambulation. On repeat examination with parents present, patient denied any dizziness.      #Hyperlipidemia  Lipid panel done, revealed a cholesterol of 253, and LDL of 181. Pt at home was supposed to be taking atorvastatin 10mg , however per parents has not been taking it.  ASCVD risk is 6.8%, making moderate intensity statin a consideration for him. Will encourage compliance with his medication.   Discharge Subjective:  Pt was  seen this am bedside. He says he feels better, but still remains a little dizzy (later reported resolution of dizziness in the afternoon). Mom is bedside and says he looks like he is doing much better, eating and drinking better, and resting. Mom also states that he seems to be more like himself today.  When pt was asked what had happened, he says he felt like the room was spinning and then fell.   Pt's mom also states that he snores very loudly, and takes a lot of naps throughout the day.   Discharge Exam:   BP 107/72 (BP Location: Right Arm)   Pulse 69   Temp (!) 100.5 F (38.1 C) (Oral)   Resp 17   Ht 5' 1.5" (1.562 m)   Wt 85.8 kg   SpO2 95%   BMI 35.16 kg/m  Discharge exam:   Constitutional: well-appearing male,  laying in bed, in no acute distress Cardiovascular: Bradycardic, regular rhythm. No murmurs, rubs, or gallops Pulmonary/Chest: normal work of breathing on room air, lungs clear to auscultation bilaterally Abdominal: soft, non-tender, non-distended, normoactive bowel sounds Skin: warm and dry  Pertinent Labs, Studies, and Procedures:     Latest Ref Rng & Units 10/22/2022    2:27 AM 10/21/2022    4:45 PM 10/21/2022    4:02 PM  BMP  Glucose 70 - 99 mg/dL 88  10/23/2022  426   BUN 6 - 20 mg/dL 10  11  18    Creatinine 0.61 - 1.24 mg/dL 834   1.96   Sodium 135 - 145 mmol/L 140  137  137   Potassium 3.5 - 5.1 mmol/L 4.3  3.7  5.3   Chloride 98 - 111 mmol/L 107  104  107   CO2 22 - 32 mmol/L 28  22    Calcium 8.9 - 10.3 mg/dL 7.9  8.0         Latest Ref Rng & Units 10/21/2022    4:02 PM 10/21/2022    3:28 PM 03/09/2020    9:31 AM  CBC  WBC 4.0 - 10.5 K/uL  6.5  4.6   Hemoglobin 13.0 - 17.0 g/dL 10/23/2022  05/09/2020  89.2   Hematocrit 39.0 - 52.0 % 44.0  42.2  44.9   Platelets 150 - 400 K/uL  175  248      Discharge Instructions: Discharge Instructions     Call MD for:  difficulty breathing, headache or visual disturbances   Complete by: As directed    Call MD for:   persistant nausea and vomiting   Complete by: As directed    Call MD for:  redness, tenderness, or signs of infection (pain, swelling, redness, odor or green/yellow discharge around incision site)   Complete by: As directed    Call MD for:  severe uncontrolled pain   Complete by: As directed    Call MD for:  temperature >100.4   Complete by: As directed    Diet - low sodium heart healthy   Complete by: As directed    Increase activity  slowly   Complete by: As directed      It was a pleasure meeting you. You were brought to the hospital after a fall, likely due to dehydration because of the flu symptoms you've been having with the diarrhea. It's very important you stay hydrated!   For your low heart rate, the heart doctors are recommending a monitor for two weeks, and then following up with them. You should receive one in the mail. They will also arrange follow up with you as well.   For the flu, you can treat symptoms with tylenol and fluids.   Merry Christmas and Happy Holidays!   SignedOlegario Messier, MD 10/22/2022, 2:09 PM   Pager: [phone]

## 2022-10-22 NOTE — Care Plan (Signed)
Per md order, discharged patient to home with parents.  INT to LFA discontinued, dressing placed with no bleeding noted.  Encouraged parents to have patient hydrate more through out day, sit on side of bed or chair and take several deep breaths before standing.  Instructed on follow up appts and to wear heart monitor.  Parents verbalize understanding. NADN.  Pt taken to personal vehicle by RN, accompanied by mom. Ivin Booty, RN

## 2022-10-24 ENCOUNTER — Other Ambulatory Visit (INDEPENDENT_AMBULATORY_CARE_PROVIDER_SITE_OTHER): Payer: Medicare Other

## 2022-10-24 DIAGNOSIS — R001 Bradycardia, unspecified: Secondary | ICD-10-CM

## 2022-10-24 DIAGNOSIS — R55 Syncope and collapse: Secondary | ICD-10-CM

## 2022-10-24 NOTE — Progress Notes (Unsigned)
Enrolled patient for a 14 day Zio AT monitor to be mailed to patients home  Dr Caroline Sauger to read?

## 2022-10-26 DIAGNOSIS — R001 Bradycardia, unspecified: Secondary | ICD-10-CM

## 2022-10-26 DIAGNOSIS — R55 Syncope and collapse: Secondary | ICD-10-CM

## 2022-10-27 ENCOUNTER — Other Ambulatory Visit: Payer: Self-pay

## 2022-10-27 ENCOUNTER — Telehealth: Payer: Self-pay | Admitting: Physician Assistant

## 2022-10-27 NOTE — Telephone Encounter (Signed)
Pt is returning call and is requesting return call.  

## 2022-10-27 NOTE — Telephone Encounter (Signed)
Patient's father returned a call to the office this morning. Please see Angie Dukes message below regarding the patient having two four second pauses earlier this morning:  "Returned call from [employer] who reported:   Automatic trigger for: 4.1 sec pause at 0550 EST   4.4 sec pause at 0554 EST   Sinus rhythm before and after both pauses - HR 46   Monitor company was unable to reach patient.    I attempted to reach out to the patient without an answer - did leave a voice message.    He is on no AV nodal agents. Given that this was a nocturnal pause (high suspicion that patient was likely sleeping) and rhythm after pause was sinus bradycardia, will not send for a welfare check at this time. I will send this message to Dr. Kem Parkinson team so they can attempt another phone call later this morning. Pt may need to be evaluated by EP service.   Question sleep apnea given BMI."  Please advise.

## 2022-10-27 NOTE — Telephone Encounter (Signed)
Returned call from [employer] who reported:  Automatic trigger for: 4.1 sec pause at 0550 EST  4.4 sec pause at 0554 EST  Sinus rhythm before and after both pauses - HR 46  Monitor company was unable to reach patient.   I attempted to reach out to the patient without an answer - did leave a voice message.   He is on no AV nodal agents. Given that this was a nocturnal pause (high suspicion that patient was likely sleeping) and rhythm after pause was sinus bradycardia, will not send for a welfare check at this time. I will send this message to Dr. Kem Parkinson team so they can attempt another phone call later this morning. Pt may need to be evaluated by EP service.  Question sleep apnea given BMI.

## 2022-10-28 ENCOUNTER — Telehealth: Payer: Self-pay | Admitting: Cardiology

## 2022-10-28 NOTE — Telephone Encounter (Signed)
Received call from [facility] Trigger notification:  4.9 second pause at 6am EST, return to SB with rates in the 40-50 range  Spoke with patient's father, reports he has been resting this morning. No symptoms. Advised warning signs for evaluation in the ED.   No AV nodal agents. Given this was a nocturnal pause (no symptoms), will continue with current treatment plan for cardiac monitoring. Will route to primary MD for follow up.

## 2022-10-31 ENCOUNTER — Telehealth: Payer: Self-pay

## 2022-10-31 DIAGNOSIS — R42 Dizziness and giddiness: Secondary | ICD-10-CM

## 2022-10-31 NOTE — Telephone Encounter (Signed)
Spoke with father, [name]. Advised of Dr. Wendy Poet recommendation of a Sleep Study. Father verbalized understanding and will pick up device when insurance approves.

## 2022-10-31 NOTE — Telephone Encounter (Signed)
Steven [name] sleep study per Dr. Wendy Poet note

## 2022-11-01 ENCOUNTER — Telehealth: Payer: Self-pay | Admitting: Physician Assistant

## 2022-11-01 ENCOUNTER — Telehealth: Payer: Self-pay | Admitting: *Deleted

## 2022-11-01 NOTE — Telephone Encounter (Addendum)
[employer] [phone]) paged regarding a 4.8 second pause occurred around 4:30AM this morning while the patient was still asleep. See previous phone message, multiple documentation of nocturnal pauses.

## 2022-11-01 NOTE — Telephone Encounter (Signed)
Steven [name] notified ok to activate itamar device.

## 2022-11-02 ENCOUNTER — Telehealth: Payer: Self-pay

## 2022-11-02 NOTE — Telephone Encounter (Signed)
   Cardiac Monitor Alert  Date of alert:  11/02/2022   Patient Name: Steven [name]  DOB: 1966-05-18  MRN: [id number]   [facility] Cardiologist: Dr. Roselie Awkward [facility] EP:  None    Monitor Information: Long Term Monitor-Live Telemetry [ZioAT]  Reason:  syncope and bradycardia  Ordering provider:  Almyra Deforest, PA   Alert Pause(s) - Longest:  4.4 seconds This is the 1st alert for this rhythm.   Next Cardiology Appointment   Date:  12/13/2022  Provider:  Dr Geraldo Pitter  The patient was contacted today.  He is asymptomatic. Arrhythmia, symptoms and history reviewed with Dr. Harrington Challenger, (DOD).   Plan:  Continue to monitor.   Other: Spoke with patient's father per DPR. Patient is limited verbally.   Tor Netters, RN  11/02/2022 10:57 AM

## 2022-11-03 ENCOUNTER — Telehealth: Payer: Self-pay | Admitting: Cardiology

## 2022-11-03 ENCOUNTER — Telehealth: Payer: Self-pay

## 2022-11-03 ENCOUNTER — Telehealth: Payer: Self-pay | Admitting: *Deleted

## 2022-11-03 NOTE — Telephone Encounter (Signed)
Would it be possible to change the parameters regarding the pause limit?

## 2022-11-03 NOTE — Telephone Encounter (Signed)
Steven [name] calling to give result

## 2022-11-03 NOTE — Telephone Encounter (Signed)
Sleep Apnea Evaluation  [facility]  Today's Date: 11/03/2022   Patient Name: Steven [name]        DOB: [birth year]       Height:        Weight:    BMI: There is no height or weight on file to calculate BMI.    Referring Provider:  Agustin Cree   STOP-BANG RISK ASSESSMENT         If STOP-BANG Score ?3 OR two clinical symptoms - patient qualifies for WatchPAT (CPT 95800)      Sleep study ordered due to two (2) of the following clinical symptoms/diagnoses:  Excessive daytime sleepiness G47.10  Gastroesophageal reflux K21.9  Nocturia R35.1  Morning Headaches G44.221  Difficulty concentrating R41.840  Memory problems or poor judgment G31.84  Personality changes or irritability R45.4  Loud snoring R06.83  Depression F32.9  Unrefreshed by sleep G47.8  Impotence N52.9  History of high blood pressure R03.0  Insomnia G47.00  Sleep Disordered Breathing or Sleep Apnea ICD G47.33     Spoke with father [name]. He will come by to pick up [name] sleep study on Wednesday in [facility].

## 2022-11-03 NOTE — Telephone Encounter (Signed)
   1/5 Spoke with Alyse Low with [employer] who reports pt has a 4.2 sec pause at 7:13 this am.  1/5 Patient had a min HR of 33 bpm, max HR of 53 bpm, and avg HR of 39 bpm. Sinus was present with a min HR of 33 bpm, max HR of 53 bpm, and avg HR of 39 bpm. 1 Pause occurred lasting 4.1 secs. Isolated SVE(s) and SVE Couplet(s) were present. Isolated VE(s) were present. Junctional Rhythm was present. MD notification criteria for Pauses met - notified Dr. Grayce Sessions on 03 Nov 2022 at 2:44 AM EST (PG).  1/3 [employer] [phone]) paged regarding a 4.8 second pause occurred around 4:30AM this morning while the patient was still asleep. See previous phone message, multiple documentation of nocturnal pauses.    10/28/22 Received call from [facility] Trigger notification:   4.9 second pause at 6am EST, return to SB with rates in the 40-50 range   Spoke with patient's father, reports he has been resting this morning. No symptoms. Advised warning signs for evaluation in the ED.    No AV nodal agents. Given this was a nocturnal pause (no symptoms), will continue with current treatment plan for cardiac monitoring. Will route to primary MD for follow up.

## 2022-11-03 NOTE — Telephone Encounter (Signed)
Rhythm reviewed by Dr. Agustin Cree who reqquested we move the pt's appoiment up from 12/13/22. Appt moved to 11/20/22. Pt is doing well and has no issues per [employer] per DPR.

## 2022-11-03 NOTE — Telephone Encounter (Signed)
Spoke with Threasa Beards of after hours service--Per Threasa Beards, she received a call from a [place] with [employer] who was calling to report critical monitor findings.  Ticket#: [id number]

## 2022-11-03 NOTE — Telephone Encounter (Signed)
See previous note

## 2022-11-03 NOTE — Telephone Encounter (Signed)
   Cardiac Monitor Alert  Date of alert:  11/03/2022   Patient Name: Steven [name]  DOB: 1966-02-13  MRN: [id number]   [facility] Cardiologist: None  [facility] EP:  None    Monitor Information: Long Term Monitor-Live Telemetry [ZioAT]  Reason:  Syncope and Bradycardia Ordering provider:  Janan Ridge   Alert Pause(s) - Longest:  6.0 seconds This is the 4th alert for this rhythm.   Next Cardiology Appointment   Has cardiology appointment with Dr. Geraldo Pitter 11/22/22 After review with office DOD Harrington Challenger) and Dr. Lovena Le --pt will be contacted for EP consult w Dr. Lovena Le  The patient's father [name]) was contacted today.  He is asymptomatic. (Asleep) Arrhythmia, symptoms and history reviewed with Dr. Harrington Challenger and Dr. Lovena Le.   Plan:  EP consult to be scheduled.  Strips signed and scanned into chart.    Rodman Key, RN  11/03/2022 11:19 AM

## 2022-11-05 ENCOUNTER — Telehealth: Payer: Self-pay | Admitting: Physician Assistant

## 2022-11-05 ENCOUNTER — Telehealth: Payer: Self-pay | Admitting: Cardiology

## 2022-11-05 NOTE — Telephone Encounter (Signed)
Case reviewed with Dr. Quentin Ore.  I changed parameters to notify for pauses greater than 6 seconds.

## 2022-11-05 NOTE — Telephone Encounter (Signed)
Second call received from [employer] today:  3.5 sec and 5.3 sec pauses at 9:33 AM eastern 4.4 sec at 9:46 AM  Pt was asymptomatic when they called.  I reached out to his father (pt has Down's syndrome) and he confirmed the pt was sleeping at the time of these pauses. Pt father stated they already have an appt with Dr. Geraldo Pitter at the end of January. I stated that they needed to have an appt with EP before that and to expect a call next week.   Prior  notes suggest Dr. Lovena Le would be seeing the patient in clinic - I do not see this appt made yet.  I sent a message to scheduler [place] this morning.

## 2022-11-05 NOTE — Telephone Encounter (Signed)
Call received from [employer]:  4.4 sec pause at 6:21 AM [place] time. 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 They reached out to the patient and spoke with his family member who reports he was sleeping at the time. Pt was awakened by family member.   I do not see that there is an appt scheduled with EP. I will reach out to get him on the schedule given ongoing pauses.

## 2022-11-05 NOTE — Telephone Encounter (Signed)
[employer] contacting 11/05/22 am  Reports patient had 7.2s pause at 3:30am today while sleeping. Patient was woken up- no symptoms reported. Earlier 11/04/22: called for 4.2s pause as well.  Seems like patient has significant OSA- recommend be compliant with CPAP and call if he has any symptoms of dizziness or syncope/presyncope  F/w Cardiology

## 2022-11-06 ENCOUNTER — Telehealth: Payer: Self-pay

## 2022-11-06 ENCOUNTER — Telehealth: Payer: Self-pay | Admitting: Cardiology

## 2022-11-06 ENCOUNTER — Telehealth: Payer: Self-pay | Admitting: Physician Assistant

## 2022-11-06 NOTE — Telephone Encounter (Signed)
Paged by Francisco Capuchin [phone]. Alerted of 2 back to back pauses, 3 secs and 3.2 secs at 4:49AM this morning during sleep. Patient was asymptomatic. Reminded [employer] to increase alert to >6 sec pause as mentioned in the previous note.

## 2022-11-06 NOTE — Telephone Encounter (Signed)
Per note / order from Mr. Almyra Deforest, PA-C, Zio Patient Notification Criteria Change seen in note, and form received at [facility] on [employer], 11/06/2022 at 0800 am.    Order from Mr. Almyra Deforest, PA-C and Dr. Quentin Ore  taken to [facility] DOD, Dr. Shary Key to sign on 11/06/22, for pause alert increase to > / = to 6 Seconds.  Faxed to [employer] on 11/06/22 at 1004 am.     3 [employer] alerts received on 11/06/2022 at 0800 am, for sinus pauses from 11/04/22.  See Alert reports.

## 2022-11-06 NOTE — Telephone Encounter (Signed)
Received a call from [employer] that the patient was having pauses and during the call the [employer] rep noticed that the alert had already been called to Diona Browner, and Isaac Laud had already reminded [employer] in a previous note to increase the alert to greater than 6 seconds. Please see Hao's note below:  "Paged by Francisco Capuchin [phone]. Alerted of 2 back to back pauses, 3 secs and 3.2 secs at 4:49AM this morning during sleep. Patient was asymptomatic. Reminded [employer] to increase alert to >6 sec pause as mentioned in the previous note."

## 2022-11-06 NOTE — Telephone Encounter (Signed)
Cardiac Monitor Alert  Date of alert:  11/06/2022   Patient Name: Steven [name]  DOB: [birth year]  MRN: [id number]   [place] Cardiologist: Mr. Harrell Lark PA-C [facility] EP:  None    Monitor Information: Long Term Monitor-Live Telemetry [ZioAT]  Reason:  Syncope and collapse  Ordering provider:  Harrell Lark, PA-C   Alert Pause(s) - Longest:  4.4 seconds This is the multiple alerts for this rhythm.   Next Cardiology Appointment   Date:  11/04/22  Provider:  H.Meng  Arrhythmia, symptoms and history reviewed with DOD, Dr. Jon Billings.   Plan:  Appt with EP  Other: [employer] alert received 11/06/22 at 0800 am, from 11/04/2022 at 249 am;  A 4.4 Sinus Pause was  detected.  Multiple sinus pauses have been detected while patient has been wearing his Zio Heat Monitor.  Per provider note from Mr. Harrell Lark, PA-C, and Dr. Lalla Brothers, this concern is known.  Pt's monitor Alert has been extend to > / = to 6 seconds per provider order.  Monitor alert has been taken to DOD, Dr. Jon Billings at [facility] on [facility].  Dr. Shari Prows agrees with current plan of care, which is to schedule an appointment with EP in the near future.  No other orders were given at this time;  Per other provider / nursing notes, Pt was resting when this occurred, and is asymptomatic.     Elmore Guise, RN  11/06/2022 10:21 AM       Cardiac Monitor Alert  Date of alert:  11/06/2022   Patient Name: Steven [name]  DOB: 05-12-1966  MRN: [id number]   [facility] Cardiologist: Harrell Lark PA-C [facility] EP:  None    Monitor Information: Long Term Monitor-Live Telemetry [ZioAT]  Reason:  Syncope and Collapse Ordering provider:  Harrell Lark, PA-C   Alert Pause(s) - Longest:  4.1  seconds This is the  unknown number alert for this rhythm.   Next Cardiology Appointment   Date:  11/22/22  Provider:  Dr. Tomie China, [facility]  The patient could NOT be reached by telephone today.  Per piror notes,  pt is asymptomatic. Arrhythmia, symptoms and history reviewed with DOD, Dr. Jon Billings.   Plan:  Appt with EP   Other: [employer] alert received 11/06/22 at 0800 am, from 11/04/2022 at 429 am;  A 4.1 Sinus Pause was  detected.  Multiple sinus pauses have been detected while patient has been wearing his Zio Heat Monitor.  Per provider note from Mr. Harrell Lark, PA-C, and Dr. Lalla Brothers, this concern is known.  Pt's monitor Alert has been extend to > / = to 6 seconds per provider order.  Monitor alert has been taken to DOD, Dr. Jon Billings at [facility] on [facility].  Dr. Shari Prows agrees with current plan of care, which is to schedule an appointment with EP in the near future.  No other orders were given at this time;  Per other provider / nursing notes, Pt was resting when this occurred, and is asymptomatic.    Elmore Guise, RN  11/06/2022 10:35 AM        Cardiac Monitor Alert  Date of alert:  11/06/2022   Patient Name: Steven [name]  DOB: 04/05/1966  MRN: [id number]   [facility] Cardiologist: Harrell Lark, PA-C  [facility] EP:  None    Monitor Information: Long Term Monitor-Live Telemetry [ZioAT]  Reason:  Syncope and Collapse Ordering provider:  Janan Ridge, PA-C   Alert Pause(s) - Longest:  5.5 seconds This is the  unknown number  alert for this rhythm.   Next Cardiology Appointment   Date:  11/22/22  Provider:  Dr. Geraldo Pitter, [facility]  The patient could NOT be reached by telephone today.  Prior notes asymptomatic Arrhythmia, symptoms and history reviewed with DOD, Dr. Shary Key.   Plan:  Appt with EP   Other: [employer] alert received 11/06/22 at 0800 am, from 11/04/2022 at 527 am;  A 5.5 second Sinus Pause was  detected.  Multiple sinus pauses have been detected while patient has been wearing his Zio Heat Monitor.  Per provider note from Mr. Janan Ridge, PA-C, and Dr. Quentin Ore, this concern is known.  Pt's monitor Alert has been extend to > / = to 6 seconds per provider order.   Monitor alert has been taken to DOD, Dr. Shary Key at [facility] on [facility].  Dr. Johney Frame agrees with current plan of care, which is to schedule an appointment with EP in the near future.  No other orders were given at this time;  Per other provider / nursing notes, Pt was resting when this occurred, and is asymptomatic.    Varney Daily, RN  11/06/2022 10:40 AM

## 2022-11-06 NOTE — Telephone Encounter (Signed)
Calling with abnormal results  

## 2022-11-08 ENCOUNTER — Telehealth: Payer: Self-pay | Admitting: Internal Medicine

## 2022-11-08 ENCOUNTER — Encounter (INDEPENDENT_AMBULATORY_CARE_PROVIDER_SITE_OTHER): Payer: Medicare Other | Admitting: Cardiology

## 2022-11-08 DIAGNOSIS — G4733 Obstructive sleep apnea (adult) (pediatric): Secondary | ICD-10-CM | POA: Diagnosis not present

## 2022-11-08 NOTE — Telephone Encounter (Signed)
   Cardiac Monitor Alert  Date of alert:  11/08/2022   Patient Name: Steven [name]  DOB: 01/25/1966  MRN: [id number]   [place] Cardiologist: None  [facility] EP:  Dr. Virl Axe  Monitor Information: Long Term Monitor-Live Telemetry [ZioAT]  Reason:  Syncope and Collapse Ordering provider:  Janan Ridge, PA-C   Alert Pause(s) - Longest:  10 seconds There were 4 total monitor alert reports / captures for this patient the morning of 11/08/22, while patient was sleeping.    Next Cardiology Appointment   Date:  11/27/22  Provider:  Dr. Virl Axe   The patient was contacted today.  He is asymptomatic. Arrhythmia, symptoms and history reviewed with DOD, Dr. Ali Lowe.   Plan:  Follow up with EP appointment, Dr. Virl Axe    Other: Early this morning 11/08/22, during patients sleep, 4 different monitor alert reports were captured / recorded by [employer].  The are as follows:  11/08/22 at 400 am 4.1-6.4 second pause, at 426 am 3.9-5.8 second pause, and 440 am 3.0-5.2 second pause, at 453 am, 8.4 second pause, and at 503 am, 10 second pause, 540 am 6.3 second pause, at 809 am a 5.5 second pause, and lastly at 811 am a 6.2 second pause.  All reports taken to DOD, Dr. Ali Lowe for review, and he was made aware that the patient was asymptomatic and sleeping at the time of all of these Alerts captured.  Per Dr. Ali Lowe, patient will keep EP appointment with Dr. Virl Axe as ordered / scheduled for 11/27/2022 at 230 pm.  Michalene RN reached out to patients father this morning, and patient was asymptomatic.  No other orders were received at this time.      Varney Daily, RN  11/08/2022 4:13 PM

## 2022-11-08 NOTE — Telephone Encounter (Signed)
Sam from [facility] is calling with abnormal Zio results.

## 2022-11-08 NOTE — Telephone Encounter (Signed)
ZIO AT - Day multiple pauses  4:55 am 6.4 sec Back to back at 4:40 am totaling 8.2 sec, w underlying rhythm 10 sec pause at 5 am 6.2 sec 8:11 am   Looks like this should be day 14/14, however [employer] stated they show the patient was given a replacement device.  She does not know the reason.  She shows this is day one of the replacement. I spoke with [name] (DPR) - [employer] called them and told them it was getting full and to wear the replacement for 2 days.  He also said the patient slept until about 9:30 this am.  He is up and ate breakfast and is his usual self.   He is scheduled with Dr. Caryl Comes on 11/27/22.  Per previous notes parameters are for calling for only greater than 6.0 seconds and continue w plan to monitor.    Once strips received with review with DOD.

## 2022-11-08 NOTE — Telephone Encounter (Signed)
Pt has an appointment on 11/22/22 with you.

## 2022-11-09 ENCOUNTER — Ambulatory Visit: Payer: Medicare Other | Attending: Cardiology

## 2022-11-09 DIAGNOSIS — R42 Dizziness and giddiness: Secondary | ICD-10-CM

## 2022-11-09 NOTE — Procedures (Signed)
SLEEP STUDY REPORT Patient Information Study Date: 11/08/2022 Patient Name: Steven [name] Patient ID: [id number] Birth Date: [birth year] Age: 57 Gender: Male BMI: 35.8 (W=189 lb, H=5' 1'') Referring Physician: Jenne Campus, MD  TEST DESCRIPTION: Home sleep apnea testing was completed using the WatchPat, a Type 1 device, utilizing peripheral arterial tonometry (PAT), chest movement, actigraphy, pulse oximetry, pulse rate, body position and snore.  AHI was calculated with apnea and hypopnea using valid sleep time as the denominator. RDI includes apneas, hypopneas, and RERAs.  The data acquired and the scoring of sleep and all associated events were performed in accordance with the recommended standards and specifications as outlined in the AASM Manual for the Scoring of Sleep and Associated Events 2.2.0 (2015).  FINDINGS:  1.  Severe Obstructive Sleep Apnea with AHI 45.3/hr.   2.  No significant Central Sleep Apnea with pAHIc 8.9/hr.  3.  Oxygen desaturations as low as 86%.  4.  Severe snoring was present. O2 sats were < 88% for 1.2 min.  5.  Total sleep time was 6 hrs and 59 min.  6.  17.2% of total sleep time was spent in REM sleep.   7.  Normal sleep onset latency at 16 min.   8.  Shortened REM sleep onset latency at 47 min.   9.  Total awakenings were 6.  10. Arrhythmia detection:  Suggestive of possible brief atrial fibrillation lasting 53 seconds.  This is not diagnostic and further testing with outpatient telemetry monitoring is recommended.  DIAGNOSIS:   Severe Obstructive Sleep Apnea (G47.33) Possible Atrial Arrhythmias  RECOMMENDATIONS:   1.  Clinical correlation of these findings is necessary.  The decision to treat obstructive sleep apnea (OSA) is usually based on the presence of apnea symptoms or the presence of associated medical conditions such as Hypertension, Congestive Heart Failure, Atrial Fibrillation or Obesity.  The most common symptoms of OSA are  snoring, gasping for breath while sleeping, daytime sleepiness and fatigue.   2.  Initiating apnea therapy is recommended given the presence of symptoms and/or associated conditions. Recommend proceeding with one of the following:     a.  Auto-CPAP therapy with a pressure range of 5-20cm H2O.     b.  An oral appliance (OA) that can be obtained from certain dentists with expertise in sleep medicine.  These are primarily of use in non-obese patients with mild and moderate disease.     c.  An ENT consultation which may be useful to look for specific causes of obstruction and possible treatment options.     d.  If patient is intolerant to PAP therapy, consider referral to ENT for evaluation for hypoglossal nerve stimulator.   3.  Close follow-up is necessary to ensure success with CPAP or oral appliance therapy for maximum benefit.  4.  A follow-up oximetry study on CPAP is recommended to assess the adequacy of therapy and determine the need for supplemental oxygen or the potential need for Bi-level therapy.  An arterial blood gas to determine the adequacy of baseline ventilation and oxygenation should also be considered.  5.  Healthy sleep recommendations include:  adequate nightly sleep (normal 7-9 hrs/night), avoidance of caffeine after noon and alcohol near bedtime, and maintaining a sleep environment that is cool, dark and quiet.  6.  Weight loss for overweight patients is recommended.  Even modest amounts of weight loss can significantly improve the severity of sleep apnea.  7.  Snoring recommendations include:  weight loss  where appropriate, side sleeping, and avoidance of alcohol before bed.  8.  Operation of motor vehicle should be avoided when sleepy.  9.  Consider outpatient event monitor to assess for silent atrial fibrillation if clinically indicated.  Signature:   Fransico Him, MD; [facility]; [place], [place] Board of Sleep Medicine Electronically Signed: 11/09/2022

## 2022-11-14 ENCOUNTER — Telehealth: Payer: Self-pay

## 2022-11-14 NOTE — Telephone Encounter (Signed)
   Cardiac Monitor Alert  Date of alert:  11/14/2022   Patient Name: Steven [name]  DOB: [birth year]  MRN: [id number]   [place] Cardiologist: Janan Ridge, [place] EP:  Follow up appointment with Dr. Virl Axe, 11/27/2022.    Monitor Information: Long Term Monitor-Live Telemetry [ZioAT]  Reason:  Syncope and collapse Ordering provider:  Janan Ridge, PA-C   Alert Pause(s) - Longest:  10  seconds This is the  End of summary report;    alert for this rhythm.   Next Cardiology Appointment   Date:  11/27/2022  Provider:  Dr. Virl Axe  The patient could NOT be reached by telephone today.  11/14/2022 Arrhythmia, symptoms and history reviewed with DOD, Dr. Candee Furbish.   Plan:  Follow up with EP Provider, Dr. Virl Axe, on 11/27/2022 at 230 pm.    Other: End of summary, Zio Heart Monitor report received on 11/14/2022 at 310 pm.   Pt wore Zio Monitor from 11/07/2022 to 11/09/2022.  Per Zio End of summary report, Pt had 212 Episodes of captured Sinus pauses, 3.0 seconds to 10.0 seconds. ( See Zio Report.)  Zio report taken to DOD, Dr. Candee Furbish for review.  Dr. Marlou Porch made aware of EP consult appointment already made with Dr. Virl Axe, for 11/27/2022.  Per Dr. Marlou Porch, Pt needs to keep EP Consult appointment already made.  No new orders given for Pt plan of care.  Dr. Marlou Porch signed the End of summary report, and it will be scanned into the patients chart.    Varney Daily, RN  11/14/2022 3:13 PM

## 2022-11-17 ENCOUNTER — Telehealth: Payer: Self-pay | Admitting: *Deleted

## 2022-11-17 ENCOUNTER — Other Ambulatory Visit: Payer: Self-pay | Admitting: *Deleted

## 2022-11-17 DIAGNOSIS — G4733 Obstructive sleep apnea (adult) (pediatric): Secondary | ICD-10-CM

## 2022-11-17 DIAGNOSIS — G4736 Sleep related hypoventilation in conditions classified elsewhere: Secondary | ICD-10-CM

## 2022-11-17 NOTE — Telephone Encounter (Signed)
Patient's father notified of sleep study results and recommendations. ( Patient non-verbal) he agrees to proceed with CPAP titration. All questions were answered.

## 2022-11-17 NOTE — Telephone Encounter (Signed)
-----  Message from Sueanne Margarita, MD sent at 11/09/2022 11:32 AM EST ----- Please let patient know that they have sleep apnea.  Recommend therapeutic CPAP titration for treatment of patient's sleep disordered breathing.  If unable to perform an in lab titration then initiate ResMed auto CPAP from 4 to 15cm H2O with heated humidity and mask of choice and overnight pulse ox on CPAP.

## 2022-11-22 ENCOUNTER — Ambulatory Visit: Payer: Medicare Other | Admitting: Cardiology

## 2022-11-22 ENCOUNTER — Encounter: Payer: Self-pay | Admitting: Cardiology

## 2022-11-22 ENCOUNTER — Ambulatory Visit: Payer: Medicare Other | Attending: Cardiology | Admitting: Cardiology

## 2022-11-22 VITALS — BP 108/70 | HR 57 | Ht 59.0 in | Wt 181.0 lb

## 2022-11-22 DIAGNOSIS — R001 Bradycardia, unspecified: Secondary | ICD-10-CM | POA: Diagnosis not present

## 2022-11-22 DIAGNOSIS — G4730 Sleep apnea, unspecified: Secondary | ICD-10-CM

## 2022-11-22 DIAGNOSIS — I495 Sick sinus syndrome: Secondary | ICD-10-CM | POA: Diagnosis not present

## 2022-11-22 DIAGNOSIS — I455 Other specified heart block: Secondary | ICD-10-CM | POA: Insufficient documentation

## 2022-11-22 DIAGNOSIS — E669 Obesity, unspecified: Secondary | ICD-10-CM

## 2022-11-22 NOTE — Progress Notes (Signed)
Cardiology Office Note:    Date:  11/22/2022   ID:  Steven [name], DOB [birth year], MRN [id number]  PCP:  Wendie Agreste, MD  Cardiologist:  Jenean Lindau, MD   Referring MD: Wendie Agreste, MD    ASSESSMENT:    1. Tachycardia-bradycardia syndrome (HCC)   2. Junctional bradycardia   3. Sinus arrest   4. Sleep apnea, unspecified type   5. Obesity (BMI 35.0-39.9 without comorbidity)    PLAN:    In order of problems listed above:  Primary prevention stressed with the patient. Sinus bradycardia and pauses: These are significant.  However the patient has significant sleep apnea and is in the middle of that evaluation.  Patient came to the clinic today.  He did not have any appointment but he resumed it.  Therefore I saw him.  Parents also mention that he is completely asymptomatic.  He has an appointment with our electrophysiology colleague Dr. Jolyn Nap coming up in the next few days.  Patient was advised to keep up with this appointment.  Also a collaboration between a sleep study specialist and electrophysiology will be helpful.  I think that if the patient has severe sleep apnea if this can be treated and he can be continued to be monitored to follow-up on rhythm issues that might be a good strategy to see if he needs further evaluation or the possibility of permanent pacing.  I discussed this with the patient and family and they vocalized understanding.  Questions were answered to their satisfaction. Obstructive sleep apnea: Family mentions to me that the doctor's office has been trying to reach them for follow-up on this but they have not responded to make appointments.  They plan to do this soon. He will be seen in follow-up about it on a as needed basis.  He should be best followed by our local cardiologist in [place].   Medication Adjustments/Labs and Tests Ordered: Current medicines are reviewed at length with the patient today.  Concerns regarding medicines are outlined  above.  Orders Placed This Encounter  Procedures   EKG 12-Lead   No orders of the defined types were placed in this encounter.    No chief complaint on file.    History of Present Illness:    Steven [name] is a 57 y.o. male.  Patient has history of Down syndrome.  He is parents are accompanying him.  He was recently evaluated and found to have significant sleep apnea.  He was admitted to the hospital with the flu.  He was significantly dehydrated and treated subsequently standpoint.  There is no history of syncope or any such problems except when he was dehydrated.  He was found to have slow heart rate and therefore monitoring was done which revealed significant pauses.  During that time the rhythm monitoring company called [place] and cardiologist in the practice and aware of this.  Patient also has significant sleep apnea and the pauses in my review have been happening in the night or early hours of the morning.  Again patient has not had any syncope.  Ejection fraction by echo was fine.  Past Medical History:  Diagnosis Date   Complication of anesthesia    per patient father, patient was slow to wake with a previous surgery    Down syndrome    Incisional hernia    MR (mental retardation)     Past Surgical History:  Procedure Laterality Date   HERNIA REPAIR     unaware of  the year of , maybe 10 years ago    INCISIONAL HERNIA REPAIR N/A 07/17/2017   Procedure: LAPAROSCOPIC INCISIONAL HERNIA WITH MESH;  Surgeon: Abigail Miyamoto, MD;  Location: [facility];  Service: General;  Laterality: N/A;   INSERTION OF MESH N/A 07/17/2017   Procedure: INSERTION OF MESH;  Surgeon: Abigail Miyamoto, MD;  Location: [facility];  Service: General;  Laterality: N/A;    Current Medications: Current Meds  Medication Sig   Ascorbic Acid (VITAMIN C PO) Take 1,000 mg by mouth daily.    Cholecalciferol (VITAMIN D PO) Take 5,000 Units by mouth daily.    Multiple Vitamin (MULTIVITAMIN WITH MINERALS) TABS tablet  Take 1 tablet by mouth daily.   Omega-3 Fatty Acids (OMEGA-3 CF PO) Take 1,600 mg by mouth daily.     Allergies:   Patient has no known allergies.   Social History   Socioeconomic History   Marital status: Single    Spouse name: Not on file   Number of children: Not on file   Years of education: Not on file   Highest education level: Not on file  Occupational History   Not on file  Tobacco Use   Smoking status: Never   Smokeless tobacco: Never  Vaping Use   Vaping Use: Never used  Substance and Sexual Activity   Alcohol use: Yes    Alcohol/week: 1.0 - 2.0 standard drink of alcohol    Types: 1 - 2 Glasses of wine per week   Drug use: No   Sexual activity: Not on file  Other Topics Concern   Not on file  Social History Narrative   Not on file   Social Determinants of Health   Financial Resource Strain: Not on file  Food Insecurity: Not on file  Transportation Needs: Not on file  Physical Activity: Not on file  Stress: Not on file  Social Connections: Not on file     Family History: The patient's family history includes Diabetes in his father. There is no history of Hypertension, Heart disease, or Cancer.  ROS:   Please see the history of present illness.    All other systems reviewed and are negative.  EKGs/Labs/Other Studies Reviewed:    The following studies were reviewed today: EKG reveals junctional rhythm and nonspecific ST-T changes   Recent Labs: 10/21/2022: ALT 16; Hemoglobin 15.0; Platelets 175 10/22/2022: BUN 10; Creatinine, Ser 1.27; Magnesium 2.3; Potassium 4.3; Sodium 140; TSH 4.914  Recent Lipid Panel    Component Value Date/Time   CHOL 253 (H) 10/22/2022 0819   CHOL 193 04/26/2020 1033   TRIG 110 10/22/2022 0819   HDL 50 10/22/2022 0819   HDL 52 04/26/2020 1033   CHOLHDL 5.1 10/22/2022 0819   VLDL 22 10/22/2022 0819   LDLCALC 181 (H) 10/22/2022 0819   LDLCALC 125 (H) 04/26/2020 1033    Physical Exam:    VS:  BP 108/70   Pulse (!)  57   Ht 4\' 11"  (1.499 m)   Wt 181 lb 0.6 oz (82.1 kg)   SpO2 99%   BMI 36.57 kg/m     Wt Readings from Last 3 Encounters:  11/22/22 181 lb 0.6 oz (82.1 kg)  10/21/22 189 lb 2.5 oz (85.8 kg)  06/14/21 189 lb 3.2 oz (85.8 kg)     GEN: Patient is in no acute distress HEENT: Normal NECK: No JVD; No carotid bruits LYMPHATICS: No lymphadenopathy CARDIAC: Hear sounds regular, 2/6 systolic murmur at the apex. RESPIRATORY:  Clear to auscultation  without rales, wheezing or rhonchi  ABDOMEN: Soft, non-tender, non-distended MUSCULOSKELETAL:  No edema; No deformity  SKIN: Warm and dry NEUROLOGIC:  Alert and oriented x 3 PSYCHIATRIC:  Normal affect   Signed, Jenean Lindau, MD  11/22/2022 11:09 AM    [place]

## 2022-11-22 NOTE — Patient Instructions (Signed)
Medication Instructions:  Your physician recommends that you continue on your current medications as directed. Please refer to the Current Medication list given to you today.  *If you need a refill on your cardiac medications before your next appointment, please call your pharmacy*   Lab Work: None ordered If you have labs (blood work) drawn today and your tests are completely normal, you will receive your results only by: MyChart Message (if you have MyChart) OR A paper copy in the mail If you have any lab test that is abnormal or we need to change your treatment, we will call you to review the results.   Testing/Procedures: None ordered   Follow-Up: At [facility], you and your health needs are our priority.  As part of our continuing mission to provide you with exceptional heart care, we have created designated Provider Care Teams.  These Care Teams include your primary Cardiologist (physician) and Advanced Practice Providers (APPs -  Physician Assistants and Nurse Practitioners) who all work together to provide you with the care you need, when you need it.  We recommend signing up for the patient portal called "MyChart".  Sign up information is provided on this After Visit Summary.  MyChart is used to connect with patients for Virtual Visits (Telemedicine).  Patients are able to view lab/test results, encounter notes, upcoming appointments, etc.  Non-urgent messages can be sent to your provider as well.   To learn more about what you can do with MyChart, go to [email or web address].    Your next appointment:   As needed  The format for your next appointment:   In Person  Provider:   Rajan Revankar, MD    Other Instructions none  Important Information About Sugar       

## 2022-11-27 ENCOUNTER — Ambulatory Visit: Payer: Medicare Other | Attending: Internal Medicine | Admitting: Internal Medicine

## 2022-11-27 ENCOUNTER — Encounter: Payer: Self-pay | Admitting: Internal Medicine

## 2022-11-27 VITALS — BP 110/66 | HR 52 | Ht 59.0 in | Wt 179.8 lb

## 2022-11-27 DIAGNOSIS — R001 Bradycardia, unspecified: Secondary | ICD-10-CM | POA: Diagnosis not present

## 2022-11-27 DIAGNOSIS — G4733 Obstructive sleep apnea (adult) (pediatric): Secondary | ICD-10-CM | POA: Diagnosis not present

## 2022-11-27 DIAGNOSIS — I455 Other specified heart block: Secondary | ICD-10-CM | POA: Diagnosis not present

## 2022-11-27 NOTE — Progress Notes (Signed)
ELECTROPHYSIOLOGY CONSULT NOTE  Patient ID: Steven [name], MRN: [id number], DOB/AGE: 59-Jul-1967 59 y.o. Admit date: (Not on file) Date of Consult: 11/27/2022  Primary Physician: Wendie Agreste, MD Primary Cardiologist: RR     Kadence Mimbs is a 57 y.o. male who is being seen today for the evaluation of bradycardia at the request of DrRR.    HPI Huel Centola is a 57 y.o. male with Down syndrome, significant sleep apnea, and recent hospitalization for influenza during which she was noted to be bradycardic.  A monitor was applied significant pauses were identified and he is referred for further evaluation  Because of the significance of the pauses, he underwent overnight sleep testing at home, severe sleep apnea.  A hospital titration was advised because of the severity of the sleep apnea.  Apparently no history of syncope except for an episode that occurred just before Christmas when in the context of influenza A and diarrhea he had orthostatic hypotension and syncope  DATE TEST EF   12/23 Echo   60-65 %         Date Cr K Hgb  12/23 1.27 4.3 15.0        TSH was nearly normal  LDL 181 on atorvastatin    Past Medical History:  Diagnosis Date   Complication of anesthesia    per patient father, patient was slow to wake with a previous surgery    Down syndrome    Incisional hernia    MR (mental retardation)       Surgical History:  Past Surgical History:  Procedure Laterality Date   HERNIA REPAIR     unaware of the year of , maybe 10 years ago    [place] N/A 07/17/2017   Procedure: LAPAROSCOPIC INCISIONAL HERNIA WITH MESH;  Surgeon: Coralie Keens, MD;  Location: [facility];  Service: General;  Laterality: N/A;   INSERTION OF MESH N/A 07/17/2017   Procedure: INSERTION OF MESH;  Surgeon: Coralie Keens, MD;  Location: [facility];  Service: General;  Laterality: N/A;     Home Meds: Current Meds  Medication Sig   Ascorbic Acid (VITAMIN C PO) Take  1,000 mg by mouth daily.    Cholecalciferol (VITAMIN D PO) Take 5,000 Units by mouth daily.    Multiple Vitamin (MULTIVITAMIN WITH MINERALS) TABS tablet Take 1 tablet by mouth daily.   Omega-3 Fatty Acids (OMEGA-3 CF PO) Take 1,600 mg by mouth daily.    Allergies: No Known Allergies  Social History   Socioeconomic History   Marital status: Single    Spouse name: Not on file   Number of children: Not on file   Years of education: Not on file   Highest education level: Not on file  Occupational History   Not on file  Tobacco Use   Smoking status: Never   Smokeless tobacco: Never  Vaping Use   Vaping Use: Never used  Substance and Sexual Activity   Alcohol use: Yes    Alcohol/week: 1.0 - 2.0 standard drink of alcohol    Types: 1 - 2 Glasses of wine per week   Drug use: No   Sexual activity: Not on file  Other Topics Concern   Not on file  Social History Narrative   Not on file   Social Determinants of Health   Financial Resource Strain: Not on file  Food Insecurity: Not on file  Transportation Needs: Not on file  Physical Activity: Not on file  Stress: Not on file  Social Connections: Not on file  Intimate Partner Violence: Not on file     Family History  Problem Relation Age of Onset   Diabetes Father    Hypertension Neg Hx    Heart disease Neg Hx    Cancer Neg Hx      ROS:  Please see the history of present illness.     All other systems reviewed and negative.    Physical Exam: Blood pressure 110/66, pulse (!) 52, height 4\' 11"  (1.499 m), weight 179 lb 12.8 oz (81.6 kg), SpO2 97 %. General: Well developed, well nourished male in no acute distress. Down Facies Head: Normocephalic, atraumatic, sclera non-icteric, no xanthomas, nares are without discharge. EENT: normal poor dentition  Lymph Nodes:  none Neck: Negative for carotid bruits. JVD not elevated. Back:without scoliosis kyphosis Lungs: Clear bilaterally to auscultation without wheezes, rales, or  rhonchi. Breathing is unlabored. Heart: RRR with S1 S2. No    systolic murmur . No rubs, or gallops appreciated. Abdomen: Soft, non-tender, non-distended with normoactive bowel sounds. No hepatomegaly. No rebound/guarding. No obvious abdominal masses. Msk:  Strength and tone appear normal for age. Extremities: No clubbing or cyanosis  edema.  Distal pedal pulses are 2+ and equal bilaterally. Downs hands  Skin: Warm and Dry Neuro: Alert and oriented X 3. CN III-XII intact Grossly normal sensory and motor function . Psych:  Responds to questions appropriately with a normal affect.        EKG: Sinus isorhythmic with junctional rhythm at 43 with retrograde P waves  Event recorder was reviewed.  Numerous nocturnal pauses, some as long as 10 seconds. Junctional rhythm with retrograde P waves during the day as well as at night  Bigeminal pattern is probably with septal PVCs (different axis for relatively narrow QRS   Assessment and Plan:  Nocturnal pauses  Severe sleep apnea  Down syndrome  Sinus bradycardia  Nocturnal bradycardia and pauses almost certainly secondary to the sleep apnea which has been reported is severe.  I spoke with Dr. Eugene Garnet who will work on facilitating his in-hospital sleep study to be done this week given the severity of the sleep apnea.  He is not pauses are in themselves not an indication for pacing.  He also has daytime bradycardia and sinus node dysfunction as evidenced by the ECG today.  A quick literature review identifies at least 1 paper in which sinus node dysfunction was identified in adults with trisomy 21, these presented with syncope and needed pacing.  In the event that Eduard Clos has symptoms following treatment of his sleep apnea, pacing may be indicated.  Will plan to arrange for a follow-up monitor following titration of his sleep therapies.  In this regard I will plan to see him again in about 6 months    Virl Axe

## 2022-11-27 NOTE — Patient Instructions (Addendum)
Medication Instructions:  Your physician recommends that you continue on your current medications as directed. Please refer to the Current Medication list given to you today.  *If you need a refill on your cardiac medications before your next appointment, please call your pharmacy*   Lab Work: None ordered.  If you have labs (blood work) drawn today and your tests are completely normal, you will receive your results only by: MyChart Message (if you have MyChart) OR A paper copy in the mail If you have any lab test that is abnormal or we need to change your treatment, we will call you to review the results.   Testing/Procedures: None ordered.    Follow-Up: At [facility], you and your health needs are our priority.  As part of our continuing mission to provide you with exceptional heart care, we have created designated Provider Care Teams.  These Care Teams include your primary Cardiologist (physician) and Advanced Practice Providers (APPs -  Physician Assistants and Nurse Practitioners) who all work together to provide you with the care you need, when you need it.  We recommend signing up for the patient portal called "MyChart".  Sign up information is provided on this After Visit Summary.  MyChart is used to connect with patients for Virtual Visits (Telemedicine).  Patients are able to view lab/test results, encounter notes, upcoming appointments, etc.  Non-urgent messages can be sent to your provider as well.   To learn more about what you can do with MyChart, go to [email or web address].    Your next appointment:   6 months with Dr Klein 

## 2022-11-28 NOTE — Telephone Encounter (Signed)
This has already been taken care of by [facility].

## 2022-11-28 NOTE — Telephone Encounter (Signed)
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

## 2022-12-12 ENCOUNTER — Ambulatory Visit (HOSPITAL_BASED_OUTPATIENT_CLINIC_OR_DEPARTMENT_OTHER): Payer: Medicare Other | Attending: Cardiology | Admitting: Cardiology

## 2022-12-12 VITALS — Ht 62.0 in | Wt 168.0 lb

## 2022-12-12 DIAGNOSIS — G4733 Obstructive sleep apnea (adult) (pediatric): Secondary | ICD-10-CM | POA: Insufficient documentation

## 2022-12-12 DIAGNOSIS — G4736 Sleep related hypoventilation in conditions classified elsewhere: Secondary | ICD-10-CM | POA: Diagnosis present

## 2022-12-13 ENCOUNTER — Ambulatory Visit: Payer: Medicare Other | Admitting: Cardiology

## 2022-12-14 NOTE — Procedures (Signed)
   Patient Name: Steven [name], Steven [name] Date: 12/12/2022 Gender: Male D.O.B: [birth year] Age (years): 50 Referring Provider: Fransico Him MD, ABSM Height (inches): 62 Interpreting Physician: Fransico Him MD, ABSM Weight (lbs): 168 RPSGT: Zadie Rhine BMI: 31 MRN: [id number] Neck Size: 16.00  CLINICAL INFORMATION The patient is referred for a CPAP titration to treat sleep apnea.  SLEEP STUDY TECHNIQUE As per the AASM Manual for the Scoring of Sleep and Associated Events v2.3 (April 2016) with a hypopnea requiring 4% desaturations.  The channels recorded and monitored were frontal, central and occipital EEG, electrooculogram (EOG), submentalis EMG (chin), nasal and oral airflow, thoracic and abdominal wall motion, anterior tibialis EMG, snore microphone, electrocardiogram, and pulse oximetry. Continuous positive airway pressure (CPAP) was initiated at the beginning of the study and titrated to treat sleep-disordered breathing.  MEDICATIONS Medications self-administered by patient taken the night of the study : N/A  TECHNICIAN COMMENTS Comments added by technician: one restroom visted. Patient had difficulty initiating sleep. Comments added by scorer: N/A  RESPIRATORY PARAMETERS Optimal PAP Pressure (cm):13  AHI at Optimal Pressure (/hr):0 Overall Minimal O2 (%):92.0  Supine % at Optimal Pressure (%):47 Minimal O2 at Optimal Pressure (%): 94.0   SLEEP ARCHITECTURE The study was initiated at 9:33:27 PM and ended at 4:24:58 AM.  Sleep onset time was 0.0 minutes and the sleep efficiency was 77.6%. The total sleep time was 319.4 minutes.  The patient spent 0.8% of the night in stage N1 sleep, 73.5% in stage N2 sleep, 3.3% in stage N3 and 22.4% in REM.Stage REM latency was 232.4 minutes  Wake after sleep onset was 92.1. Alpha intrusion was absent. Supine sleep was 77.66%.  CARDIAC DATA The 2 lead EKG demonstrated sinus rhythm. The mean heart rate was 46.5 beats per minute. Other  EKG findings include: PACs, sinus pauses with junctional escape beats.  LEG MOVEMENT DATA The total Periodic Limb Movements of Sleep (PLMS) were 0. The PLMS index was 0.0. A PLMS index of <15 is considered normal in adults.  IMPRESSIONS - The optimal PAP pressure was 13 cm of water. - Central sleep apnea was not noted during this titration (CAI = 0.4/h). - Significant oxygen desaturations were not observed during this titration (min O2 = 92.0%). - No snoring was audible during this study. - PACs, sinus pauses and junctional escape beats were observed during this study. - Clinically significant periodic limb movements were not noted during this study. Arousals associated with PLMs were rare.  DIAGNOSIS - Obstructive Sleep Apnea (G47.33)  RECOMMENDATIONS - Trial of ResMed CPAP therapy on 13 cm H2O with a Small size Resmed Full Face AirFit F20 mask and heated humidification. - Avoid alcohol, sedatives and other CNS depressants that may worsen sleep apnea and disrupt normal sleep architecture. - Sleep hygiene should be reviewed to assess factors that may improve sleep quality. - Weight management and regular exercise should be initiated or continued. - Return to Sleep Center for re-evaluation after 4 weeks of therapy  [Electronically signed] 12/14/2022 01:01 PM  Fransico Him MD, ABSM Diplomate, American Board of Sleep Medicine

## 2022-12-18 ENCOUNTER — Telehealth: Payer: Self-pay | Admitting: *Deleted

## 2022-12-18 NOTE — Telephone Encounter (Signed)
The patient has been notified of the result and verbalized understanding.  All questions (if any) were answered. Steven [name], [place] 12/18/2022 4:55 PM    Upon patient request DME selection is [facility]. Patient understands he will be contacted by [facility] to set up his cpap. Patient understands to call if [facility] does not contact him with new setup in a timely manner. Patient understands they will be called once confirmation has been received from [facility]/ that they have received their new machine to schedule 10 week follow up appointment.   [facility] notified of new cpap order  Please add to airview Patient was grateful for the call and thanked me.

## 2022-12-18 NOTE — Telephone Encounter (Signed)
-----   Message from Lauralee Evener, [place] sent at 12/14/2022  1:44 PM EST -----  ----- Message ----- From: Sueanne Margarita, MD Sent: 12/14/2022   1:03 PM EST To: Cv Div Sleep Studies  Please let patient know that they had a successful PAP titration and let DME know that orders are in EPIC.  Please set up 6 week OV with me.

## 2022-12-22 ENCOUNTER — Encounter: Payer: Self-pay | Admitting: Family Medicine

## 2022-12-22 ENCOUNTER — Ambulatory Visit (INDEPENDENT_AMBULATORY_CARE_PROVIDER_SITE_OTHER): Payer: Medicare Other | Admitting: Family Medicine

## 2022-12-22 VITALS — BP 110/60 | HR 64 | Temp 98.4°F | Ht 62.0 in | Wt 175.6 lb

## 2022-12-22 DIAGNOSIS — I455 Other specified heart block: Secondary | ICD-10-CM | POA: Diagnosis not present

## 2022-12-22 DIAGNOSIS — R7989 Other specified abnormal findings of blood chemistry: Secondary | ICD-10-CM | POA: Diagnosis not present

## 2022-12-22 DIAGNOSIS — R32 Unspecified urinary incontinence: Secondary | ICD-10-CM | POA: Diagnosis not present

## 2022-12-22 DIAGNOSIS — R12 Heartburn: Secondary | ICD-10-CM

## 2022-12-22 DIAGNOSIS — R42 Dizziness and giddiness: Secondary | ICD-10-CM

## 2022-12-22 DIAGNOSIS — R001 Bradycardia, unspecified: Secondary | ICD-10-CM | POA: Diagnosis not present

## 2022-12-22 DIAGNOSIS — R350 Frequency of micturition: Secondary | ICD-10-CM | POA: Diagnosis not present

## 2022-12-22 LAB — BASIC METABOLIC PANEL
BUN: 11 mg/dL (ref 6–23)
CO2: 30 mEq/L (ref 19–32)
Calcium: 9.6 mg/dL (ref 8.4–10.5)
Chloride: 103 mEq/L (ref 96–112)
Creatinine, Ser: 1.19 mg/dL (ref 0.40–1.50)
GFR: 67.23 mL/min (ref >60.00)
Glucose, Bld: 91 mg/dL (ref 70–99)
Potassium: 4.5 mEq/L (ref 3.5–5.1)
Sodium: 141 mEq/L (ref 135–145)

## 2022-12-22 LAB — POCT URINALYSIS DIPSTICK
Bilirubin, UA: NEGATIVE
Blood, UA: NEGATIVE
Glucose, UA: NEGATIVE
Ketones, UA: POSITIVE
Leukocytes, UA: NEGATIVE
Nitrite, UA: NEGATIVE
Protein, UA: POSITIVE — AB
Spec Grav, UA: 1.025 (ref 1.010–1.025)
Urobilinogen, UA: 0.2 E.U./dL
pH, UA: 6 (ref 5.0–8.0)

## 2022-12-22 LAB — CBC
HCT: 48.7 % (ref 39.0–52.0)
Hemoglobin: 16.4 g/dL (ref 13.0–17.0)
MCHC: 33.7 g/dL (ref 30.0–36.0)
MCV: 101.8 fl — ABNORMAL HIGH (ref 78.0–100.0)
Platelets: 254 10*3/uL (ref 150.0–400.0)
RBC: 4.78 Mil/uL (ref 4.22–5.81)
RDW: 15.5 % (ref 11.5–15.5)
WBC: 4.5 10*3/uL (ref 4.0–10.5)

## 2022-12-22 LAB — TSH: TSH: 3.81 u[IU]/mL (ref 0.35–5.50)

## 2022-12-22 LAB — PSA: PSA: 0.17 ng/mL (ref 0.10–4.00)

## 2022-12-22 NOTE — Patient Instructions (Addendum)
Start cpap as soon as available. If any worsening of dizziness, or new symptoms be seen right away. See list of foods to avoid for heartburn, but can try over the counter pepcid twice per day for now. Follow up if the chest symptoms are not improving.  If any concerns on urine test.    Food Choices for Gastroesophageal Reflux Disease, Adult When you have gastroesophageal reflux disease (GERD), the foods you eat and your eating habits are very important. Choosing the right foods can help ease the discomfort of GERD. Consider working with a dietitian to help you make healthy food choices. What are tips for following this plan? Reading food labels Look for foods that are low in saturated fat. Foods that have less than 5% of daily value (DV) of fat and 0 g of trans fats may help with your symptoms. Cooking Cook foods using methods other than frying. This may include baking, steaming, grilling, or broiling. These are all methods that do not need a lot of fat for cooking. To add flavor, try to use herbs that are low in spice and acidity. Meal planning  Choose healthy foods that are low in fat, such as fruits, vegetables, whole grains, low-fat dairy products, lean meats, fish, and poultry. Eat frequent, small meals instead of three large meals each day. Eat your meals slowly, in a relaxed setting. Avoid bending over or lying down until 2-3 hours after eating. Limit high-fat foods such as fatty meats or fried foods. Limit your intake of fatty foods, such as oils, butter, and shortening. Avoid the following as told by your health care provider: Foods that cause symptoms. These may be different for different people. Keep a food diary to keep track of foods that cause symptoms. Alcohol. Drinking large amounts of liquid with meals. Eating meals during the 2-3 hours before bed. Lifestyle Maintain a healthy weight. Ask your health care provider what weight is healthy for you. If you need to lose weight,  work with your health care provider to do so safely. Exercise for at least 30 minutes on 5 or more days each week, or as told by your health care provider. Avoid wearing clothes that fit tightly around your waist and chest. Do not use any products that contain nicotine or tobacco. These products include cigarettes, chewing tobacco, and vaping devices, such as e-cigarettes. If you need help quitting, ask your health care provider. Sleep with the head of your bed raised. Use a wedge under the mattress or blocks under the bed frame to raise the head of the bed. Chew sugar-free gum after mealtimes. What foods should I eat?  Eat a healthy, well-balanced diet of fruits, vegetables, whole grains, low-fat dairy products, lean meats, fish, and poultry. Each person is different. Foods that may trigger symptoms in one person may not trigger any symptoms in another person. Work with your health care provider to identify foods that are safe for you. The items listed above may not be a complete list of recommended foods and beverages. Contact a dietitian for more information. What foods should I avoid? Limiting some of these foods may help manage the symptoms of GERD. Everyone is different. Consult a dietitian or your health care provider to help you identify the exact foods to avoid, if any. Fruits Any fruits prepared with added fat. Any fruits that cause symptoms. For some people this may include citrus fruits, such as oranges, grapefruit, pineapple, and lemons. Vegetables Deep-fried vegetables. [place] fries. Any vegetables prepared with  added fat. Any vegetables that cause symptoms. For some people, this may include tomatoes and tomato products, chili peppers, onions and garlic, and horseradish. Grains Pastries or quick breads with added fat. Meats and other proteins High-fat meats, such as fatty beef or pork, hot dogs, ribs, ham, sausage, salami, and bacon. Fried meat or protein, including fried fish and  fried chicken. Nuts and nut butters, in large amounts. Dairy Whole milk and chocolate milk. Sour cream. Cream. Ice cream. Cream cheese. Milkshakes. Fats and oils Butter. Margarine. Shortening. Ghee. Beverages Coffee and tea, with or without caffeine. Carbonated beverages. Sodas. Energy drinks. Fruit juice made with acidic fruits, such as orange or grapefruit. Tomato juice. Alcoholic drinks. Sweets and desserts Chocolate and cocoa. Donuts. Seasonings and condiments Pepper. Peppermint and spearmint. Added salt. Any condiments, herbs, or seasonings that cause symptoms. For some people, this may include curry, hot sauce, or vinegar-based salad dressings. The items listed above may not be a complete list of foods and beverages to avoid. Contact a dietitian for more information. Questions to ask your health care provider Diet and lifestyle changes are usually the first steps that are taken to manage symptoms of GERD. If diet and lifestyle changes do not improve your symptoms, talk with your health care provider about taking medicines. Where to find more information International Foundation for Gastrointestinal Disorders: aboutgerd.org Summary When you have gastroesophageal reflux disease (GERD), food and lifestyle choices may be very helpful in easing the discomfort of GERD. Eat frequent, small meals instead of three large meals each day. Eat your meals slowly, in a relaxed setting. Avoid bending over or lying down until 2-3 hours after eating. Limit high-fat foods such as fatty meats or fried foods. This information is not intended to replace advice given to you by your health care provider. Make sure you discuss any questions you have with your health care provider. Document Revised: 04/26/2020 Document Reviewed: 04/26/2020 Elsevier Patient Education  [place].

## 2022-12-22 NOTE — Progress Notes (Unsigned)
Subjective:  Patient ID: Steven [name], male    DOB: 16-Nov-1965  Age: 57 y.o. MRN: [id number]  CC:  Chief Complaint  Patient presents with   Chest Pain    Pt father notes some burning in the chest post meals with spice, mother does not think it is heart burn    Urinary Incontinence    urinary frequency urgency and incontinence,    Fatigue    Pt  father notes fatigue since patient had flu over christmas as well as occasional dizziness     HPI Steven [name] presents for   Heartburn, burning in chest Per parent  - more sensitive to spicy foods. Any pepper - hurts in upper chest. No vomiting. No otc heartburn meds tried.  No hx of GERD/PUD.  No minimal caffeine - 1/4 cup only now.  No n/v/abd pain. No fever.   Fatigue Noted since influenza at Christmas time, notes occasional dizziness.  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 Caryl Comes January 29.  Note reviewed.  Previous monitor indicated significant pauses.  Episode of syncope before Christmas in the context of influenza with diarrhea, orthostatic hypotension and syncope.  Echo in December with EF 60 to 65%.  Nocturnal bradycardia and pauses likely secondary to sleep apnea but with daytime bradycardia sinus node dysfunction.  Plan for evaluation after sleep study as pacing may be indicated. Sleep study 12/12/2022.  Diagnosed with obstructive sleep apnea, optimal PAP pressure 13 cm.  PACs, sinus pauses and junctional escape beats were observed during the study. Started on CPAP with setting above.  4-week follow-up planned. Here with parents. Insurance verified yesterday, still waiting on machine.  Few dizzy spells, but no syncope. Notes with standing after sitting for awhile.  No melena/hematochezia.  Borderline tsh - 4.914 in December.  Drinking fluids - not tracking amount yet. Drinks water  throughout day.   Urinary incontinence Urinary urgency with some incontinence reported. Increased frequency of urination past month to 6 weeks. No dysuria or hematuria. Some episodes of urge incontinence. UOP 6-7 times per day. No nocturia.  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   Incarcerated incisional hernia 07/17/2017   Past Medical History:  Diagnosis Date   Complication of anesthesia    per patient father, patient was slow to wake with a previous surgery    Down syndrome    Incisional hernia    MR (mental retardation)    Past Surgical History:  Procedure Laterality Date   HERNIA REPAIR     unaware of the year of , maybe 10 years ago    [place] N/A 07/17/2017   Procedure: LAPAROSCOPIC INCISIONAL HERNIA WITH MESH;  Surgeon: Coralie Keens, MD;  Location: [facility];  Service: General;  Laterality: N/A;   INSERTION OF MESH N/A 07/17/2017   Procedure: INSERTION OF MESH;  Surgeon: Coralie Keens, MD;  Location: [facility];  Service: General;  Laterality: N/A;   No Known Allergies Prior to Admission medications   Medication Sig Start Date End Date Taking? Authorizing Provider  Cholecalciferol (VITAMIN D PO) Take 5,000 Units by  mouth daily.    Yes [provider]  Multiple Vitamin (MULTIVITAMIN WITH MINERALS) TABS tablet Take 1 tablet by mouth daily.   Yes [provider]  Omega-3 Fatty Acids (OMEGA-3 CF PO) Take 1,600 mg by mouth daily.   Yes [provider]   Social History   Socioeconomic History   Marital status: Single    Spouse name: Not on file   Number of children: Not on file   Years of education:  Not on file   Highest education level: Not on file  Occupational History   Not on file  Tobacco Use   Smoking status: Never   Smokeless tobacco: Never  Vaping Use   Vaping Use: Never used  Substance and Sexual Activity   Alcohol use: Yes    Alcohol/week: 1.0 - 2.0 standard drink of alcohol    Types: 1 - 2 Glasses of wine per week   Drug use: No   Sexual activity: Not on file  Other Topics Concern   Not on file  Social History Narrative   Not on file   Social Determinants of Health   Financial Resource Strain: Not on file  Food Insecurity: Not on file  Transportation Needs: Not on file  Physical Activity: Not on file  Stress: Not on file  Social Connections: Not on file  Intimate Partner Violence: Not on file    Review of Systems Per HPI.   Objective:   Vitals:   12/22/22 0918 12/22/22 0919 12/22/22 0920  BP: (!) 110/58 116/66 110/60  Pulse: 64    Temp: 98.4 F (36.9 C)    TempSrc: Temporal    SpO2: 100%    Weight: 175 lb 9.6 oz (79.7 kg)    Height: '5\' 2"'$  (1.575 m)       Physical Exam Vitals reviewed.  Constitutional:      General: He is not in acute distress.    Appearance: Normal appearance. He is well-developed. He is not ill-appearing, toxic-appearing or diaphoretic.  HENT:     Head: Normocephalic and atraumatic.  Neck:     Vascular: No carotid bruit or JVD.  Cardiovascular:     Rate and Rhythm: Regular rhythm. Bradycardia present.     Heart sounds: Normal heart sounds. No murmur heard. Pulmonary:     Effort: Pulmonary effort is normal.     Breath sounds: Normal breath sounds. No rales.  Abdominal:     General: Abdomen is flat. Bowel sounds are normal. There is no distension.     Tenderness: There is abdominal tenderness (minimal lower left with deep palpation, no rebound/guarding.).  Musculoskeletal:     Right lower leg: No edema.     Left lower leg: No edema.  Skin:    General: Skin is warm and dry.  Neurological:     Mental Status: He is  alert and oriented to person, place, and time.  Psychiatric:        Mood and Affect: Mood normal.      Results for orders placed or performed in visit on 12/22/22  POCT urinalysis dipstick  Result Value Ref Range   Color, UA straw    Clarity, UA clear    Glucose, UA Negative Negative   Bilirubin, UA Negative    Ketones, UA Positive    Spec Grav, UA 1.025 1.010 - 1.025   Blood, UA Negative    pH, UA 6.0 5.0 - 8.0   Protein, UA Positive (A) Negative   Urobilinogen, UA 0.2  0.2 or 1.0 E.U./dL   Nitrite, UA Neg    Leukocytes, UA Negative Negative   Appearance     Odor       Assessment & Plan:  Steven [name] is a 57 y.o. male . Urinary incontinence, unspecified type - Plan: POCT urinalysis dipstick   No orders of the defined types were placed in this encounter.  There are no Patient Instructions on file for this visit.    Signed,   Merri Ray, MD [place], [facility] 12/22/22 9:38 AM

## 2023-01-02 ENCOUNTER — Telehealth: Payer: Self-pay

## 2023-01-02 NOTE — Telephone Encounter (Signed)
-----   Message from Wendie Agreste, MD sent at 01/02/2023 12:45 PM EST ----- Results sent by MyChart, but appears patient has not yet reviewed those results.  Please call and make sure they have either seen note or discuss result note.  Thanks.

## 2023-01-02 NOTE — Telephone Encounter (Signed)
Spoke to patient father and he declined results states he would rather discuss with you in person Friday

## 2023-01-03 NOTE — Telephone Encounter (Signed)
noted 

## 2023-01-05 ENCOUNTER — Ambulatory Visit (INDEPENDENT_AMBULATORY_CARE_PROVIDER_SITE_OTHER): Payer: Medicare Other | Admitting: Family Medicine

## 2023-01-05 ENCOUNTER — Encounter: Payer: Self-pay | Admitting: Family Medicine

## 2023-01-05 VITALS — BP 108/60 | HR 42 | Temp 98.3°F | Ht 62.0 in | Wt 176.4 lb

## 2023-01-05 DIAGNOSIS — R001 Bradycardia, unspecified: Secondary | ICD-10-CM | POA: Diagnosis not present

## 2023-01-05 DIAGNOSIS — R12 Heartburn: Secondary | ICD-10-CM

## 2023-01-05 DIAGNOSIS — R350 Frequency of micturition: Secondary | ICD-10-CM | POA: Diagnosis not present

## 2023-01-05 NOTE — Patient Instructions (Signed)
Glad to hear that you are improving.  Continue to avoid any specific foods that cause heartburn.  Small amounts of coffee may be tolerated but again watch to see if that worsens heartburn and adjust accordingly. Start CPAP for sleep apnea as planned next week, but if any return of lightheadedness or dizziness episodes, make sure to advise cardiology/electrophysiologist. Return to the clinic or go to the nearest emergency room if any of your symptoms worsen or new symptoms occur.  Frequent urination could be due to fluid intake during the day.  I am okay with not changing that at this time or any new medicines as that could worsen lightheadedness or dizziness.  If the symptoms do not improve in the next few weeks, let me know and I will refer you to a urologist.  Be seen sooner if any new or worsening symptoms.  Take care.  Urinary Frequency, Adult Urinary frequency means urinating more often than usual. You may urinate every 1-2 hours even though you drink a normal amount of fluid and do not have a bladder infection or condition. Although you urinate more often than normal, the total amount of urine produced in a day is normal. With urinary frequency, you may have an urgent need to urinate often. The stress and anxiety of needing to find a bathroom quickly can make this urge worse. This condition may go away on its own, or you may need treatment at home. Home treatment may include bladder training, exercises, taking medicines, or making changes to your diet. Follow these instructions at home: Bladder health Your health care provider will tell you what to do to improve bladder health. You may be told to: Keep a bladder diary. Keep track of: What you eat and drink. How often you urinate. How much you urinate. Follow a bladder training program. This may include: Learning to delay going to the bathroom. Double urinating, also called voiding. This helps if you are not completely emptying your  bladder. Scheduled voiding. Do Kegel exercises. Kegel exercises strengthen the muscles that help control urination, which may help the condition.  Eating and drinking Follow instructions from your health care provider about eating or drinking restrictions. You may be told to: Avoid caffeine. Drink fewer fluids, especially alcohol. Avoid drinking in the evening. Avoid foods or drinks that may irritate the bladder. These include coffee, tea, soda, artificial sweeteners, citrus, tomato-based foods, and chocolate. Eat foods that help prevent or treat constipation. Constipation can make urinary frequency worse. You may need to take these actions to prevent or treat constipation: Drink enough fluid to keep your urine pale yellow. Take over-the-counter or prescription medicines. Eat foods that are high in fiber, such as beans, whole grains, and fresh fruits and vegetables. Limit foods that are high in fat and processed sugars, such as fried or sweet foods. General instructions Take over-the-counter and prescription medicines only as told by your health care provider. Keep all follow-up visits. This is important. Contact a health care provider if: You start urinating more often. You feel pain or irritation when you urinate. You notice blood in your urine. Your urine looks cloudy. You develop a fever. You begin vomiting. Get help right away if: You are unable to urinate. Summary Urinary frequency means urinating more often than usual. With urinary frequency, you may urinate every 1-2 hours even though you drink a normal amount of fluid and do not have a bladder infection or other bladder condition. Your health care provider may recommend that you  keep a bladder diary, follow a bladder training program, or make dietary changes. If told by your health care provider, do Kegel exercises to strengthen the muscles that help control urination. Take over-the-counter and prescription medicines only as  told by your health care provider. Contact a health care provider if your symptoms do not improve or get worse. This information is not intended to replace advice given to you by your health care provider. Make sure you discuss any questions you have with your health care provider. Document Revised: 05/21/2020 Document Reviewed: 05/21/2020 Elsevier Patient Education  [place].

## 2023-01-05 NOTE — Progress Notes (Signed)
Subjective:  Patient ID: Steven [name], male    DOB: 01-14-1966  Age: 57 y.o. MRN: [id number]  CC:  Chief Complaint  Patient presents with   Heartburn    Doing better, stopped morning coffee   Urinary Frequency    Pt father notes better but still going fairly frequently     HPI Steven [name] presents for   Tachycardia-bradycardia syndrome with junctional bradycardia, sinus pause Discussed at  February 23rd visit.  Had seen electrophysiology, thought to be in part due to uncontrolled/untreated obstructive sleep apnea with nocturnal bradycardia and pauses.  Evaluated by electrophysiology.  Possible pacing may be indicated depending on symptoms with use of CPAP.  Waiting on machine as of his last visit.He had a few dizzy spells but no syncope. No recent near-syncopal symptoms, or dizziness.  Picking up CPAP next week.   Urinary incontinence, frequency Few episodes of urge incontinence but primarily increased frequency of urination for the previous 4 to 6 weeks at his last visit.  Up to 6 times per day.  No nocturia.  In office urinalysis reassuring as well as his PSA at 0.17.  Overall labs looked okay.  Question of relation to fluid intake. Still some frequent urination - every 2 hours. No dysuria or hematuria, no nocturia. No abd pain/back pain. Overall better. Doing well.  Fluid intake - few yetis per day.  No recent incontinence. Less coffee as above, no regular caffeine in take during day.   Heartburn Typical symptoms discussed last visit with trigger foods discussed and short-term H2 blocker.  Symptoms have improved with decreased coffee. Did not need new meds. No further heartburn off coffee.   History Patient Active Problem List   Diagnosis Date Noted   Tachycardia-bradycardia syndrome ([place]) 11/22/2022   Sinus pause 11/22/2022   Sinus arrest 11/22/2022   Sleep apnea 11/22/2022   Obesity (BMI 35.0-39.9 without comorbidity) 11/22/2022   Postural dizziness with presyncope  10/21/2022   Junctional bradycardia 10/21/2022   Incarcerated incisional hernia 07/17/2017   Past Medical History:  Diagnosis Date   Complication of anesthesia    per patient father, patient was slow to wake with a previous surgery    Down syndrome    Incisional hernia    MR (mental retardation)    Past Surgical History:  Procedure Laterality Date   HERNIA REPAIR     unaware of the year of , maybe 10 years ago    [place] N/A 07/17/2017   Procedure: LAPAROSCOPIC INCISIONAL HERNIA WITH MESH;  Surgeon: Coralie Keens, MD;  Location: [facility];  Service: General;  Laterality: N/A;   INSERTION OF MESH N/A 07/17/2017   Procedure: INSERTION OF MESH;  Surgeon: Coralie Keens, MD;  Location: [facility];  Service: General;  Laterality: N/A;   No Known Allergies Prior to Admission medications   Medication Sig Start Date End Date Taking? Authorizing Provider  Cholecalciferol (VITAMIN D PO) Take 5,000 Units by mouth daily.     [provider]  Multiple Vitamin (MULTIVITAMIN WITH MINERALS) TABS tablet Take 1 tablet by mouth daily.    [provider]  Omega-3 Fatty Acids (OMEGA-3 CF PO) Take 1,600 mg by mouth daily.    [provider]   Social History   Socioeconomic History   Marital status: Single    Spouse name: Not on file   Number of children: Not on file   Years of education: Not on file   Highest education level: Not on file  Occupational History  Not on file  Tobacco Use   Smoking status: Never   Smokeless tobacco: Never  Vaping Use   Vaping Use: Never used  Substance and Sexual Activity   Alcohol use: Yes    Alcohol/week: 1.0 - 2.0 standard drink of alcohol    Types: 1 - 2 Glasses of wine per week   Drug use: No   Sexual activity: Not on file  Other Topics Concern   Not on file  Social History Narrative   Not on file   Social Determinants of Health   Financial Resource Strain: Not on file  Food Insecurity: Not on file   Transportation Needs: Not on file  Physical Activity: Not on file  Stress: Not on file  Social Connections: Not on file  Intimate Partner Violence: Not on file    Review of Systems Per HPI.   Objective:   Vitals:   01/05/23 0927  BP: 108/60  Pulse: (!) 42  Temp: 98.3 F (36.8 C)  TempSrc: Temporal  SpO2: 99%  Weight: 176 lb 6.4 oz (80 kg)  Height: '5\' 2"'$  (1.575 m)     Physical Exam Vitals reviewed.  Constitutional:      Appearance: He is well-developed. He is not ill-appearing or diaphoretic.  HENT:     Head: Normocephalic and atraumatic.  Neck:     Vascular: No carotid bruit or JVD.  Cardiovascular:     Rate and Rhythm: Regular rhythm. Bradycardia present.     Heart sounds: Normal heart sounds. No murmur heard.    Comments: Bradycardic with few ectopic beats. Pulmonary:     Effort: Pulmonary effort is normal.     Breath sounds: Normal breath sounds. No rales.  Abdominal:     General: Abdomen is flat. There is no distension.     Tenderness: There is no abdominal tenderness.  Musculoskeletal:     Right lower leg: No edema.     Left lower leg: No edema.  Skin:    General: Skin is warm and dry.  Neurological:     Mental Status: He is alert and oriented to person, place, and time.  Psychiatric:        Mood and Affect: Mood normal.     Assessment & Plan:  Steven [name] is a 57 y.o. male . Sinus bradycardia  -As above, plan to start CPAP next week to see if that may be helpful.  Denies recent near syncopal or dizziness symptoms.  ER/RTC precautions, and follow-up with electrophysiology/cardiology if any persistent or recurrent symptoms.  Urinary frequency  -Without nocturia, no pain/dysuria.  Possibly related to fluid intake during the day, but I am hesitant to decrease that at this time given electrolytes were normal and some dizziness with bradycardia previously.  Also will avoid any meds for possible overactive bladder given potential for dizziness and above  symptoms.  Overall seems stable, no recurrence of incontinence.  Handout given on possible triggers including avoidance of caffeine.  Consider urology eval if persistent, can call and I can place that referral without visit.  RTC precautions.  Heartburn  -Improved off coffee, no meds needed at this time.   No orders of the defined types were placed in this encounter.  Patient Instructions  Glad to hear that you are improving.  Continue to avoid any specific foods that cause heartburn.  Small amounts of coffee may be tolerated but again watch to see if that worsens heartburn and adjust accordingly. Start CPAP for sleep apnea as planned next  week, but if any return of lightheadedness or dizziness episodes, make sure to advise cardiology/electrophysiologist. Return to the clinic or go to the nearest emergency room if any of your symptoms worsen or new symptoms occur.  Frequent urination could be due to fluid intake during the day.  I am okay with not changing that at this time or any new medicines as that could worsen lightheadedness or dizziness.  If the symptoms do not improve in the next few weeks, let me know and I will refer you to a urologist.  Be seen sooner if any new or worsening symptoms.  Take care.  Urinary Frequency, Adult Urinary frequency means urinating more often than usual. You may urinate every 1-2 hours even though you drink a normal amount of fluid and do not have a bladder infection or condition. Although you urinate more often than normal, the total amount of urine produced in a day is normal. With urinary frequency, you may have an urgent need to urinate often. The stress and anxiety of needing to find a bathroom quickly can make this urge worse. This condition may go away on its own, or you may need treatment at home. Home treatment may include bladder training, exercises, taking medicines, or making changes to your diet. Follow these instructions at home: Bladder  health Your health care provider will tell you what to do to improve bladder health. You may be told to: Keep a bladder diary. Keep track of: What you eat and drink. How often you urinate. How much you urinate. Follow a bladder training program. This may include: Learning to delay going to the bathroom. Double urinating, also called voiding. This helps if you are not completely emptying your bladder. Scheduled voiding. Do Kegel exercises. Kegel exercises strengthen the muscles that help control urination, which may help the condition.  Eating and drinking Follow instructions from your health care provider about eating or drinking restrictions. You may be told to: Avoid caffeine. Drink fewer fluids, especially alcohol. Avoid drinking in the evening. Avoid foods or drinks that may irritate the bladder. These include coffee, tea, soda, artificial sweeteners, citrus, tomato-based foods, and chocolate. Eat foods that help prevent or treat constipation. Constipation can make urinary frequency worse. You may need to take these actions to prevent or treat constipation: Drink enough fluid to keep your urine pale yellow. Take over-the-counter or prescription medicines. Eat foods that are high in fiber, such as beans, whole grains, and fresh fruits and vegetables. Limit foods that are high in fat and processed sugars, such as fried or sweet foods. General instructions Take over-the-counter and prescription medicines only as told by your health care provider. Keep all follow-up visits. This is important. Contact a health care provider if: You start urinating more often. You feel pain or irritation when you urinate. You notice blood in your urine. Your urine looks cloudy. You develop a fever. You begin vomiting. Get help right away if: You are unable to urinate. Summary Urinary frequency means urinating more often than usual. With urinary frequency, you may urinate every 1-2 hours even though  you drink a normal amount of fluid and do not have a bladder infection or other bladder condition. Your health care provider may recommend that you keep a bladder diary, follow a bladder training program, or make dietary changes. If told by your health care provider, do Kegel exercises to strengthen the muscles that help control urination. Take over-the-counter and prescription medicines only as told by your health care provider.  Contact a health care provider if your symptoms do not improve or get worse. This information is not intended to replace advice given to you by your health care provider. Make sure you discuss any questions you have with your health care provider. Document Revised: 05/21/2020 Document Reviewed: 05/21/2020 Elsevier Patient Education  [place],   Merri Ray, MD [place] 01/05/23 12:25 PM

## 2023-02-22 ENCOUNTER — Telehealth: Payer: Self-pay | Admitting: Cardiology

## 2023-02-22 NOTE — Telephone Encounter (Signed)
Spoke to patients father to schedule sleep compliance appt, father states that they have returned the machine since it was not working for him - Financial planner

## 2023-02-23 NOTE — Telephone Encounter (Signed)
Called patient's father Smitty Cords [facility]) regarding Dr. Norris Cross offer to have patient come in and adjust CPAP recommendations to see if CPAP compliance can be improved. Bruce states patient has Down's Syndrome and family was not able to get him to wear CPAP. He states they tried several things but patient is not able to tolerate and refuses to continue trying. Bruce states that due to behavioral issues he does not think patient or family can continue this treatment. Forwarded to Dr. Mayford Knife.

## 2023-03-21 ENCOUNTER — Encounter: Payer: Self-pay | Admitting: Family Medicine

## 2023-03-21 NOTE — Telephone Encounter (Signed)
Should patient see ENT given unsuccessful Ear Lavage at last OV ?

## 2023-03-23 NOTE — Telephone Encounter (Signed)
Sorry he is having this issue again.  It looks like we performed a lavage in 2022 that was successful. As long as he is not having any pain or ear discharge, I think it is reasonable to be seen in the office sometime next week for an attempt at cerumen removal.  They can try over-the-counter Debrox this weekend to see if that would be helpful first.

## 2023-04-02 ENCOUNTER — Ambulatory Visit: Payer: Medicare Other | Admitting: Family Medicine

## 2023-04-20 ENCOUNTER — Encounter: Payer: Self-pay | Admitting: Family Medicine

## 2023-04-20 ENCOUNTER — Ambulatory Visit (INDEPENDENT_AMBULATORY_CARE_PROVIDER_SITE_OTHER): Payer: Medicare Other | Admitting: Family Medicine

## 2023-04-20 VITALS — BP 128/76 | HR 65 | Temp 98.2°F | Ht 62.0 in | Wt 176.6 lb

## 2023-04-20 DIAGNOSIS — R21 Rash and other nonspecific skin eruption: Secondary | ICD-10-CM | POA: Diagnosis not present

## 2023-04-20 DIAGNOSIS — H6123 Impacted cerumen, bilateral: Secondary | ICD-10-CM

## 2023-04-20 MED ORDER — TRIAMCINOLONE ACETONIDE 0.1 % EX CREA: 1.0000 | TOPICAL_CREAM | Freq: Two times a day (BID) | CUTANEOUS | 0 refills | Status: AC

## 2023-04-20 NOTE — Patient Instructions (Addendum)
See information below on wax in ears.  Debrox over-the-counter is an option in the future but we are happy to perform lavage again if needed. I will refer you to ENT as difficult to remove today.   Rash on arm could be a type of dermatitis or contact dermatitis, potentially from plants or other chemicals.  Steroid cream twice per day as needed for now, follow-up if that is not improving in the next week or worsening sooner.  Earwax Buildup, Adult The ears produce a substance called earwax that helps keep bacteria out of the ear and protects the skin in the ear canal. Occasionally, earwax can build up in the ear and cause discomfort or hearing loss. What are the causes? This condition is caused by a buildup of earwax. Ear canals are self-cleaning. Ear wax is made in the outer part of the ear canal and generally falls out in small amounts over time. When the self-cleaning mechanism is not working, earwax builds up and can cause decreased hearing and discomfort. Attempting to clean ears with cotton swabs can push the earwax deep into the ear canal and cause decreased hearing and pain. What increases the risk? This condition is more likely to develop in people who: Clean their ears often with cotton swabs. Pick at their ears. Use earplugs or in-ear headphones often, or wear hearing aids. The following factors may also make you more likely to develop this condition: Being male. Being of older age. Naturally producing more earwax. Having narrow ear canals. Having earwax that is overly thick or sticky. Having excess hair in the ear canal. Having eczema. Being dehydrated. What are the signs or symptoms? Symptoms of this condition include: Reduced or muffled hearing. A feeling of fullness in the ear or feeling that the ear is plugged. Fluid coming from the ear. Ear pain or an itchy ear. Ringing in the ear. Coughing. Balance problems. An obvious piece of earwax that can be seen inside the ear  canal. How is this diagnosed? This condition may be diagnosed based on: Your symptoms. Your medical history. An ear exam. During the exam, your health care provider will look into your ear with an instrument called an otoscope. You may have tests, including a hearing test. How is this treated? This condition may be treated by: Using ear drops to soften the earwax. Having the earwax removed by a health care provider. The health care provider may: Flush the ear with water. Use an instrument that has a loop on the end (curette). Use a suction device. Having surgery to remove the wax buildup. This may be done in severe cases. Follow these instructions at home:  Take over-the-counter and prescription medicines only as told by your health care provider. Do not put any objects, including cotton swabs, into your ear. You can clean the opening of your ear canal with a washcloth or facial tissue. Follow instructions from your health care provider about cleaning your ears. Do not overclean your ears. Drink enough fluid to keep your urine pale yellow. This will help to thin the earwax. Keep all follow-up visits as told. If earwax builds up in your ears often or if you use hearing aids, consider seeing your health care provider for routine, preventive ear cleanings. Ask your health care provider how often you should schedule your cleanings. If you have hearing aids, clean them according to instructions from the manufacturer and your health care provider. Contact a health care provider if: You have ear pain. You develop  a fever. You have pus or other fluid coming from your ear. You have hearing loss. You have ringing in your ears that does not go away. You feel like the room is spinning (vertigo). Your symptoms do not improve with treatment. Get help right away if: You have bleeding from the affected ear. You have severe ear pain. Summary Earwax can build up in the ear and cause discomfort or  hearing loss. The most common symptoms of this condition include reduced or muffled hearing, a feeling of fullness in the ear, or feeling that the ear is plugged. This condition may be diagnosed based on your symptoms, your medical history, and an ear exam. This condition may be treated by using ear drops to soften the earwax or by having the earwax removed by a health care provider. Do not put any objects, including cotton swabs, into your ear. You can clean the opening of your ear canal with a washcloth or facial tissue. This information is not intended to replace advice given to you by your health care provider. Make sure you discuss any questions you have with your health care provider. Document Revised: 02/03/2020 Document Reviewed: 02/03/2020 Elsevier Patient Education  2024 [employer].

## 2023-04-20 NOTE — Progress Notes (Signed)
Subjective:  Patient ID: Steven [name], male    DOB: 1965-11-18  Age: 57 y.o. MRN: [id number]  CC:  Chief Complaint  Patient presents with   Cerumen Impaction    Parents note some decreased hearing, thinks ear wax related, pt notes some pressure     HPI Steven [name] presents for   Possible cerumen impaction.  See MyChart message from May 22.  Family has noted decreased hearing and previously had been plugged with wax.  Notes some intermittent pressure but no pain or discharge. Primarily R ear. No pain, discharge,   attempted home treatments - none.  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/22/2022   Sinus pause 11/22/2022   Sinus arrest 11/22/2022   Sleep apnea 11/22/2022   Obesity (BMI 35.0-39.9 without comorbidity) 11/22/2022   Postural dizziness with presyncope 10/21/2022   Junctional bradycardia 10/21/2022   Incarcerated incisional hernia 07/17/2017   Past Medical History:  Diagnosis Date   Complication of anesthesia    per patient father, patient was slow to wake with a previous surgery    Down syndrome    Incisional hernia    MR (mental retardation)    Past Surgical History:  Procedure Laterality Date   HERNIA REPAIR     unaware of the year of , maybe 10 years ago    INCISIONAL HERNIA REPAIR N/A 07/17/2017   Procedure: LAPAROSCOPIC INCISIONAL HERNIA WITH MESH;  Surgeon: Abigail Miyamoto, MD;  Location: [facility];  Service: General;  Laterality: N/A;   INSERTION OF MESH N/A 07/17/2017   Procedure: INSERTION OF MESH;  Surgeon: Abigail Miyamoto, MD;  Location: [facility];  Service: General;  Laterality: N/A;   No Known Allergies Prior to Admission medications   Medication Sig Start Date End Date Taking? Authorizing Provider  Cholecalciferol (VITAMIN D PO) Take 5,000 Units by mouth daily.    Yes [provider]  Multiple Vitamin (MULTIVITAMIN WITH MINERALS) TABS tablet Take 1 tablet by mouth daily.    Yes [provider]  Omega-3 Fatty Acids (OMEGA-3 CF PO) Take 1,600 mg by mouth daily.   Yes [provider]   Social History   Socioeconomic History   Marital status: Single    Spouse name: Not on file   Number of children: Not on file   Years of education: Not on file   Highest education level: Not on file  Occupational History   Not on file  Tobacco Use   Smoking status: Never   Smokeless tobacco: Never  Vaping Use   Vaping Use: Never used  Substance and Sexual Activity   Alcohol use: Yes    Alcohol/week: 1.0 - 2.0 standard drink of alcohol    Types: 1 - 2 Glasses of wine per week   Drug use: No   Sexual activity: Not on file  Other Topics Concern   Not on file  Social History Narrative   Not on file   Social Determinants of Health   Financial Resource Strain: Not on file  Food Insecurity: Not on file  Transportation Needs: Not on file  Physical Activity: Not on file  Stress: Not on file  Social Connections: Not on file  Intimate Partner Violence: Not on file    Review of Systems Per HPI.   Objective:   Vitals:   04/20/23 1034  BP: 128/76  Pulse: 65  Temp: 98.2 F (36.8 C)  TempSrc: Temporal  SpO2: 97%  Weight: 176 lb  9.6 oz (80.1 kg)  Height: 5\' 2"  (1.575 m)     Physical Exam Vitals reviewed.  Constitutional:      General: He is not in acute distress.    Appearance: Normal appearance. He is well-developed.  HENT:     Head: Normocephalic and atraumatic.     Right Ear: There is impacted cerumen.     Left Ear: There is impacted cerumen.  Cardiovascular:     Rate and Rhythm: Normal rate.  Pulmonary:     Effort: Pulmonary effort is normal.  Skin:    Comments: Excoriated erythematous rash left flexural area, see photo.  Neurological:     Mental Status: He is alert and oriented to person, place, and time.  Psychiatric:        Mood and Affect: Mood normal.        Risks (including but not limited to bleeding and  infection, perforation of TM), benefits, and alternatives discussed for bilateral cerumen lavage .  Verbal consent obtained after any questions were answered.perfomed by CMA.  No complications.  Unfortunately only cerumen removed after multiple attempts.  Repeat exam without perforation, canal abrasion or wounds.  Significant thick cerumen noted.  Assessment & Plan:  Steven [name] is a 57 y.o. male . Bilateral impacted cerumen  -Cerumen lavage as above without complication, but difficult with amount and thickness of cerumen.  Option of repeat lavage after initial Debrox attempt at home or ENT eval for in office cerumen removal.  They would like to be referred to ENT at this time but depending on timing of that appointment option to follow-up after home Debrox for 1 to 2 days prior.  RTC precautions given.  Rash and nonspecific skin eruption  -Suspected contact dermatitis, possibly from plants, but no other affected areas.  Topical treatment with triamcinolone with rtc precautions.  No orders of the defined types were placed in this encounter.  Patient Instructions  See information below on wax in ears.  Debrox over-the-counter is an option in the future but we are happy to perform lavage again if needed.  Rash on arm could be a type of dermatitis or contact dermatitis, potentially from plants or other chemicals.  Steroid cream twice per day as needed for now, follow-up if that is not improving in the next week or worsening sooner.    Signed,   Meredith Staggers, MD [facility], [facility] 04/20/23 10:56 AM

## 2023-07-14 ENCOUNTER — Ambulatory Visit
Admission: EM | Admit: 2023-07-14 | Discharge: 2023-07-14 | Disposition: A | Discharge status: Home / Self Care | Payer: Medicare Other | Attending: Internal Medicine | Admitting: Internal Medicine

## 2023-07-14 DIAGNOSIS — R1010 Upper abdominal pain, unspecified: Secondary | ICD-10-CM

## 2023-07-14 MED ORDER — FAMOTIDINE 20 MG PO TABS: 20.0000 mg | ORAL_TABLET | Freq: Two times a day (BID) | ORAL | 0 refills | Status: DC

## 2023-07-14 MED ORDER — MAALOX MAX 400-400-40 MG/5ML PO SUSP: 5.0000 mL | Freq: Four times a day (QID) | ORAL | 0 refills | Status: AC | PRN

## 2023-07-14 MED ORDER — OMEPRAZOLE 20 MG PO CPDR: 20.0000 mg | DELAYED_RELEASE_CAPSULE | Freq: Every day | ORAL | 0 refills | Status: DC

## 2023-07-14 NOTE — Discharge Instructions (Addendum)
Please start omeprazole once daily and famotidine twice daily (30 minutes before a meal).  This can help with the gastritis, acid reflux that may be a source of his pain.  I would recommend avoiding acidic foods, orange juice, fruit juice, sodas even if they are diet sodas or sugar-free.  I recommend eating light meals, soups, broths, chicken soup, steamed vegetables.  If he experiences worsening abdominal pain, fever, vomiting, blood in the stools or stops being able to have a bowel movement and please go to the emergency room.  Otherwise I recommend calling his general practitioner on Monday and setting up an appointment for follow-up.

## 2023-07-14 NOTE — ED Triage Notes (Addendum)
Per father pt c/o "things feeling spicy in his mouth when he eats them (sx x ) for a couple of weeks" and has to have a BM after after he eats- abd pain x 1 month-using pepto bismol and pepcid with some relief-has not sought medical care for c/o until pt stated today he wanted to go to the doctor-pt NAD-steady gait

## 2023-07-14 NOTE — ED Provider Notes (Signed)
[facility]  Note:  This document was prepared using Conservation officer, historic buildings and may include unintentional dictation errors.  MRN: [id number] DOB: 1966/04/28  Subjective:   Steven [name] is a 57 y.o. male with past medical history of incisional hernia presenting for 2-week history of intermittent discomfort in his mouth/throat, fecal urgency following eating or drinking.  Has had 1 month history of intermittent upper belly pain. No fever, vomiting, bloody stools, constipation recent antibiotic use, hospitalizations or long distance travel.  Has not eaten raw foods, drank unfiltered water.  No history of GI disorders including diverticulitis, Crohn's, IBS, ulcerative colitis.  Has a past medical history of incarcerated incisional hernia.  Had this surgically repaired September 2018.  Has 1-2 alcoholic drinks a week.  No smoking.  Patient's parents report that he does eat a mix of foods, drinks orange juice daily in the morning, has sugar-free sodas regularly.  Eats pizza or takeout 1-2 times weekly.  Has previously had an episode similar to this that resolved on its own.  He has had a colonoscopy a few years ago, was completely normal.  No current facility-administered medications for this encounter.  Current Outpatient Medications:    Cholecalciferol (VITAMIN D PO), Take 5,000 Units by mouth daily. , Disp: , Rfl:    Multiple Vitamin (MULTIVITAMIN WITH MINERALS) TABS tablet, Take 1 tablet by mouth daily., Disp: , Rfl:    Omega-3 Fatty Acids (OMEGA-3 CF PO), Take 1,600 mg by mouth daily., Disp: , Rfl:    triamcinolone cream (KENALOG) 0.1 %, Apply 1 Application topically 2 (two) times daily., Disp: 30 g, Rfl: 0   No Known Allergies  Past Medical History:  Diagnosis Date   Complication of anesthesia    per patient father, patient was slow to wake with a previous surgery    Down syndrome    Incisional hernia    MR (mental retardation)      Past Surgical  History:  Procedure Laterality Date   HERNIA REPAIR     unaware of the year of , maybe 10 years ago    INCISIONAL HERNIA REPAIR N/A 07/17/2017   Procedure: LAPAROSCOPIC INCISIONAL HERNIA WITH MESH;  Surgeon: Abigail Miyamoto, MD;  Location: [facility];  Service: General;  Laterality: N/A;   INSERTION OF MESH N/A 07/17/2017   Procedure: INSERTION OF MESH;  Surgeon: Abigail Miyamoto, MD;  Location: [facility];  Service: General;  Laterality: N/A;    Family History  Problem Relation Age of Onset   Diabetes Father    Hypertension Neg Hx    Heart disease Neg Hx    Cancer Neg Hx     Social History   Tobacco Use   Smoking status: Never   Smokeless tobacco: Never  Vaping Use   Vaping status: Never Used  Substance Use Topics   Alcohol use: Not Currently    Comment: occ   Drug use: No    ROS   Objective:   Vitals: BP 110/68 (BP Location: Left Arm)   Pulse (!) 45 Comment: father reports HR in the 40s is baseline  Temp 98.7 F (37.1 C) (Oral)   Resp 20   SpO2 99%   Physical Exam Constitutional:      General: He is not in acute distress.    Appearance: Normal appearance. He is well-developed and normal weight. He is not ill-appearing, toxic-appearing or diaphoretic.  HENT:     Head: Normocephalic and atraumatic.     Right Ear: External  ear normal.     Left Ear: External ear normal.     Nose: Nose normal.     Mouth/Throat:     Pharynx: Oropharynx is clear.  Eyes:     General: No scleral icterus.       Right eye: No discharge.        Left eye: No discharge.     Extraocular Movements: Extraocular movements intact.  Cardiovascular:     Rate and Rhythm: Normal rate.  Pulmonary:     Effort: Pulmonary effort is normal.  Abdominal:     General: There is no distension.     Palpations: Abdomen is soft. There is no mass.     Tenderness: There is generalized abdominal tenderness (mild) and tenderness in the epigastric area. There is no right CVA tenderness, left CVA tenderness,  guarding or rebound.     Comments: Slight increased bowel sounds.  Musculoskeletal:     Cervical back: Normal range of motion.  Neurological:     Mental Status: He is alert and oriented to person, place, and time.  Psychiatric:        Mood and Affect: Mood normal.        Behavior: Behavior normal.        Thought Content: Thought content normal.        Judgment: Judgment normal.     Assessment and Plan :   PDMP not reviewed this encounter.  1. Upper abdominal pain    I do not suspect a surgical abdomen, acute abdomen.  Recommended conservative management for acid reflux, heartburn given his diet.  Recommended dietary modifications and hydrating with plain water.  Avoid citrus foods, citrus drinks and sodas, tomato-based foods.  Recommend a mix of Prilosec, famotidine and Maalox.  Maintain strict ER precautions.  Otherwise follow-up with PCP as soon as possible for consideration of workup including possible blood work, imaging.  Patient verbalized understanding, will follow-up with his general practitioner and maintain strict ER precautions.   Wallis Bamberg, [place] 07/14/23 1610

## 2023-08-27 ENCOUNTER — Ambulatory Visit: Payer: Medicare Other | Admitting: Family Medicine

## 2023-08-27 VITALS — BP 116/60 | HR 46 | Temp 98.2°F | Ht 62.0 in | Wt 173.0 lb

## 2023-08-27 DIAGNOSIS — R12 Heartburn: Secondary | ICD-10-CM | POA: Diagnosis not present

## 2023-08-27 DIAGNOSIS — R1013 Epigastric pain: Secondary | ICD-10-CM

## 2023-08-27 MED ORDER — OMEPRAZOLE 20 MG PO CPDR
20.0000 mg | DELAYED_RELEASE_CAPSULE | Freq: Every day | ORAL | 3 refills | Status: AC
Start: 2023-08-27 — End: ?

## 2023-08-27 MED ORDER — FAMOTIDINE 20 MG PO TABS
20.0000 mg | ORAL_TABLET | Freq: Two times a day (BID) | ORAL | 1 refills | Status: DC
Start: 2023-08-27 — End: 2023-10-17

## 2023-08-27 NOTE — Progress Notes (Unsigned)
Subjective:  Patient ID: Steven [name], male    DOB: [birth year]  Age: 57 y.o. MRN: [id number]  CC:  Chief Complaint  Patient presents with   Gastroesophageal Reflux    Pt has been dealing with heart burn, acid reflux and nausea at varying times over the last month notes different foods will trigger these symptoms but pepto seems to help, was give omeprazole and famotidine at urgent care and those have run out but they were helpful     HPI Steven [name] presents for   Heartburn/GERD As above, has been treated by urgent care  few weeks ago with omeprazole once per day, famotidine BID - helped on meds, symptoms returned about a week ago off meds. Some stomach soreness- no f/c/n/v. Normal BM, no melena/hematochezia.   Pepto has been helpful- 2 doses past few days. Soreness at times - noted after meals.   We did discuss heartburn in March, continued avoidance of trigger foods, minimal coffee discussed at that time as that was likely trigger. No recent coffee. OJ in am.  No wt loss or night sweats.   No dysuria, frequency or other new urinary symptoms.  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/22/2022   Sinus pause 11/22/2022   Sinus arrest 11/22/2022   Sleep apnea 11/22/2022   Obesity (BMI 35.0-39.9 without comorbidity) 11/22/2022   Postural dizziness with presyncope 10/21/2022   Junctional bradycardia 10/21/2022   Incarcerated incisional hernia 07/17/2017   Past Medical History:  Diagnosis Date   Complication of anesthesia    per patient father, patient was slow to wake with a previous surgery    Down syndrome    Incisional hernia    MR (mental retardation)    Past Surgical History:  Procedure Laterality Date   HERNIA REPAIR     unaware of the year of , maybe 10 years ago    INCISIONAL HERNIA REPAIR N/A 07/17/2017   Procedure: LAPAROSCOPIC INCISIONAL HERNIA WITH MESH;  Surgeon: Abigail Miyamoto, MD;  Location:  [facility];  Service: General;  Laterality: N/A;   INSERTION OF MESH N/A 07/17/2017   Procedure: INSERTION OF MESH;  Surgeon: Abigail Miyamoto, MD;  Location: [facility];  Service: General;  Laterality: N/A;   No Known Allergies Prior to Admission medications   Medication Sig Start Date End Date Taking? Authorizing Provider  alum & mag hydroxide-simeth (MAALOX MAX) 400-400-40 MG/5ML suspension Take 5 mLs by mouth every 6 (six) hours as needed for indigestion. 07/14/23   Wallis Bamberg, PA-C  Cholecalciferol (VITAMIN D PO) Take 5,000 Units by mouth daily.     [provider]  famotidine (PEPCID) 20 MG tablet Take 1 tablet (20 mg total) by mouth 2 (two) times daily. 07/14/23   Wallis Bamberg, PA-C  Multiple Vitamin (MULTIVITAMIN WITH MINERALS) TABS tablet Take 1 tablet by mouth daily.    [provider]  Omega-3 Fatty Acids (OMEGA-3 CF PO) Take 1,600 mg by mouth daily.    [provider]  omeprazole (PRILOSEC) 20 MG capsule Take 1 capsule (20 mg total) by mouth daily. 07/14/23   Wallis Bamberg, PA-C  triamcinolone cream (KENALOG) 0.1 % Apply 1 Application topically 2 (two) times daily. 04/20/23   Shade Flood, MD   Social History   Socioeconomic History   Marital status: Single    Spouse name: Not on file   Number of children: Not on file   Years of education: Not on file  Highest education level: Not on file  Occupational History   Not on file  Tobacco Use   Smoking status: Never   Smokeless tobacco: Never  Vaping Use   Vaping status: Never Used  Substance and Sexual Activity   Alcohol use: Not Currently    Comment: occ   Drug use: No   Sexual activity: Not on file  Other Topics Concern   Not on file  Social History Narrative   Not on file   Social Determinants of Health   Financial Resource Strain: Not on file  Food Insecurity: Not on file  Transportation Needs: Not on file  Physical Activity: Not on file  Stress: Not on file  Social Connections: Not on file   Intimate Partner Violence: Not on file    Review of Systems Per HPI.   Objective:   Vitals:   08/27/23 1520  BP: 116/60  Pulse: (!) 46  Temp: 98.2 F (36.8 C)  TempSrc: Temporal  SpO2: 98%  Weight: 173 lb (78.5 kg)  Height: 5\' 2"  (1.575 m)     Physical Exam Vitals reviewed.  Constitutional:      Appearance: He is well-developed.  HENT:     Head: Normocephalic and atraumatic.  Neck:     Vascular: No carotid bruit or JVD.  Cardiovascular:     Rate and Rhythm: Normal rate and regular rhythm.     Heart sounds: Normal heart sounds. No murmur heard. Pulmonary:     Effort: Pulmonary effort is normal.     Breath sounds: Normal breath sounds. No rales.  Abdominal:     General: Abdomen is flat. There is no distension.     Tenderness: There is abdominal tenderness (epigastric.). There is no right CVA tenderness, left CVA tenderness, guarding or rebound.  Musculoskeletal:     Right lower leg: No edema.     Left lower leg: No edema.  Skin:    General: Skin is warm and dry.  Neurological:     Mental Status: He is alert and oriented to person, place, and time.  Psychiatric:        Mood and Affect: Mood normal.     Assessment & Plan:  Steven [name] is a 57 y.o. male . Epigastric pain - Plan: CBC, Comprehensive metabolic panel, omeprazole (PRILOSEC) 20 MG capsule, famotidine (PEPCID) 20 MG tablet  Heartburn - Plan: omeprazole (PRILOSEC) 20 MG capsule, famotidine (PEPCID) 20 MG tablet Suspected gastritis versus component of GERD, differential includes peptic ulcer disease but less likely.  Improved symptoms with use of PPI, H2 blocker and Maalox from urgent care.  Trigger avoidance discussed including acidic foods, orange juice.  Restart omeprazole 20 mg daily, famotidine twice daily, check CBC and CMP as above.  Recheck next few weeks and depending on symptom control consider gastroenterology eval.  RTC/ER precautions given if worsening sooner.  Meds ordered this encounter   Medications   omeprazole (PRILOSEC) 20 MG capsule    Sig: Take 1 capsule (20 mg total) by mouth daily.    Dispense:  30 capsule    Refill:  3   famotidine (PEPCID) 20 MG tablet    Sig: Take 1 tablet (20 mg total) by mouth 2 (two) times daily.    Dispense:  60 tablet    Refill:  1   Patient Instructions  Thank you for coming in today.  I suspect you do have some heartburn and some possible gastritis or irritation of the stomach.  Restart omeprazole once per day,  famotidine twice per day and avoid some of the foods listed below that can cause more heartburn.  If any change in stools, nausea, vomiting, fever or worsening abdominal pain be seen but I do not expect that to happen.  Otherwise we can recheck in 2 weeks and decide if changes needed or meeting with gastroenterology. Take care.   Food Choices for Gastroesophageal Reflux Disease, Adult When you have gastroesophageal reflux disease (GERD), the foods you eat and your eating habits are very important. Choosing the right foods can help ease the discomfort of GERD. Consider working with a dietitian to help you make healthy food choices. What are tips for following this plan? Reading food labels Look for foods that are low in saturated fat. Foods that have less than 5% of daily value (DV) of fat and 0 g of trans fats may help with your symptoms. Cooking Cook foods using methods other than frying. This may include baking, steaming, grilling, or broiling. These are all methods that do not need a lot of fat for cooking. To add flavor, try to use herbs that are low in spice and acidity. Meal planning  Choose healthy foods that are low in fat, such as fruits, vegetables, whole grains, low-fat dairy products, lean meats, fish, and poultry. Eat frequent, small meals instead of three large meals each day. Eat your meals slowly, in a relaxed setting. Avoid bending over or lying down until 2-3 hours after eating. Limit high-fat foods such as fatty  meats or fried foods. Limit your intake of fatty foods, such as oils, butter, and shortening. Avoid the following as told by your health care provider: Foods that cause symptoms. These may be different for different people. Keep a food diary to keep track of foods that cause symptoms. Alcohol. Drinking large amounts of liquid with meals. Eating meals during the 2-3 hours before bed. Lifestyle Maintain a healthy weight. Ask your health care provider what weight is healthy for you. If you need to lose weight, work with your health care provider to do so safely. Exercise for at least 30 minutes on 5 or more days each week, or as told by your health care provider. Avoid wearing clothes that fit tightly around your waist and chest. Do not use any products that contain nicotine or tobacco. These products include cigarettes, chewing tobacco, and vaping devices, such as e-cigarettes. If you need help quitting, ask your health care provider. Sleep with the head of your bed raised. Use a wedge under the mattress or blocks under the bed frame to raise the head of the bed. Chew sugar-free gum after mealtimes. What foods should I eat?  Eat a healthy, well-balanced diet of fruits, vegetables, whole grains, low-fat dairy products, lean meats, fish, and poultry. Each person is different. Foods that may trigger symptoms in one person may not trigger any symptoms in another person. Work with your health care provider to identify foods that are safe for you. The items listed above may not be a complete list of recommended foods and beverages. Contact a dietitian for more information. What foods should I avoid? Limiting some of these foods may help manage the symptoms of GERD. Everyone is different. Consult a dietitian or your health care provider to help you identify the exact foods to avoid, if any. Fruits Any fruits prepared with added fat. Any fruits that cause symptoms. For some people this may include citrus  fruits, such as oranges, grapefruit, pineapple, and lemons. Vegetables Deep-fried vegetables. [place]  fries. Any vegetables prepared with added fat. Any vegetables that cause symptoms. For some people, this may include tomatoes and tomato products, chili peppers, onions and garlic, and horseradish. Grains Pastries or quick breads with added fat. Meats and other proteins High-fat meats, such as fatty beef or pork, hot dogs, ribs, ham, sausage, salami, and bacon. Fried meat or protein, including fried fish and fried chicken. Nuts and nut butters, in large amounts. Dairy Whole milk and chocolate milk. Sour cream. Cream. Ice cream. Cream cheese. Milkshakes. Fats and oils Butter. Margarine. Shortening. Ghee. Beverages Coffee and tea, with or without caffeine. Carbonated beverages. Sodas. Energy drinks. Fruit juice made with acidic fruits, such as orange or grapefruit. Tomato juice. Alcoholic drinks. Sweets and desserts Chocolate and cocoa. Donuts. Seasonings and condiments Pepper. Peppermint and spearmint. Added salt. Any condiments, herbs, or seasonings that cause symptoms. For some people, this may include curry, hot sauce, or vinegar-based salad dressings. The items listed above may not be a complete list of foods and beverages to avoid. Contact a dietitian for more information. Questions to ask your health care provider Diet and lifestyle changes are usually the first steps that are taken to manage symptoms of GERD. If diet and lifestyle changes do not improve your symptoms, talk with your health care provider about taking medicines. Where to find more information International Foundation for Gastrointestinal Disorders: aboutgerd.org Summary When you have gastroesophageal reflux disease (GERD), food and lifestyle choices may be very helpful in easing the discomfort of GERD. Eat frequent, small meals instead of three large meals each day. Eat your meals slowly, in a relaxed setting. Avoid  bending over or lying down until 2-3 hours after eating. Limit high-fat foods such as fatty meats or fried foods. This information is not intended to replace advice given to you by your health care provider. Make sure you discuss any questions you have with your health care provider. Document Revised: 04/26/2020 Document Reviewed: 04/26/2020 Elsevier Patient Education  2024 Elsevier Inc.     Signed,   Meredith Staggers, MD [facility], [facility] 08/27/23 3:56 PM

## 2023-08-27 NOTE — Patient Instructions (Signed)
Thank you for coming in today.  I suspect you do have some heartburn and some possible gastritis or irritation of the stomach.  Restart omeprazole once per day, famotidine twice per day and avoid some of the foods listed below that can cause more heartburn.  If any change in stools, nausea, vomiting, fever or worsening abdominal pain be seen but I do not expect that to happen.  Otherwise we can recheck in 2 weeks and decide if changes needed or meeting with gastroenterology. Take care.   Food Choices for Gastroesophageal Reflux Disease, Adult When you have gastroesophageal reflux disease (GERD), the foods you eat and your eating habits are very important. Choosing the right foods can help ease the discomfort of GERD. Consider working with a dietitian to help you make healthy food choices. What are tips for following this plan? Reading food labels Look for foods that are low in saturated fat. Foods that have less than 5% of daily value (DV) of fat and 0 g of trans fats may help with your symptoms. Cooking Cook foods using methods other than frying. This may include baking, steaming, grilling, or broiling. These are all methods that do not need a lot of fat for cooking. To add flavor, try to use herbs that are low in spice and acidity. Meal planning  Choose healthy foods that are low in fat, such as fruits, vegetables, whole grains, low-fat dairy products, lean meats, fish, and poultry. Eat frequent, small meals instead of three large meals each day. Eat your meals slowly, in a relaxed setting. Avoid bending over or lying down until 2-3 hours after eating. Limit high-fat foods such as fatty meats or fried foods. Limit your intake of fatty foods, such as oils, butter, and shortening. Avoid the following as told by your health care provider: Foods that cause symptoms. These may be different for different people. Keep a food diary to keep track of foods that cause symptoms. Alcohol. Drinking large  amounts of liquid with meals. Eating meals during the 2-3 hours before bed. Lifestyle Maintain a healthy weight. Ask your health care provider what weight is healthy for you. If you need to lose weight, work with your health care provider to do so safely. Exercise for at least 30 minutes on 5 or more days each week, or as told by your health care provider. Avoid wearing clothes that fit tightly around your waist and chest. Do not use any products that contain nicotine or tobacco. These products include cigarettes, chewing tobacco, and vaping devices, such as e-cigarettes. If you need help quitting, ask your health care provider. Sleep with the head of your bed raised. Use a wedge under the mattress or blocks under the bed frame to raise the head of the bed. Chew sugar-free gum after mealtimes. What foods should I eat?  Eat a healthy, well-balanced diet of fruits, vegetables, whole grains, low-fat dairy products, lean meats, fish, and poultry. Each person is different. Foods that may trigger symptoms in one person may not trigger any symptoms in another person. Work with your health care provider to identify foods that are safe for you. The items listed above may not be a complete list of recommended foods and beverages. Contact a dietitian for more information. What foods should I avoid? Limiting some of these foods may help manage the symptoms of GERD. Everyone is different. Consult a dietitian or your health care provider to help you identify the exact foods to avoid, if any. Fruits Any fruits  prepared with added fat. Any fruits that cause symptoms. For some people this may include citrus fruits, such as oranges, grapefruit, pineapple, and lemons. Vegetables Deep-fried vegetables. [place] fries. Any vegetables prepared with added fat. Any vegetables that cause symptoms. For some people, this may include tomatoes and tomato products, chili peppers, onions and garlic, and  horseradish. Grains Pastries or quick breads with added fat. Meats and other proteins High-fat meats, such as fatty beef or pork, hot dogs, ribs, ham, sausage, salami, and bacon. Fried meat or protein, including fried fish and fried chicken. Nuts and nut butters, in large amounts. Dairy Whole milk and chocolate milk. Sour cream. Cream. Ice cream. Cream cheese. Milkshakes. Fats and oils Butter. Margarine. Shortening. Ghee. Beverages Coffee and tea, with or without caffeine. Carbonated beverages. Sodas. Energy drinks. Fruit juice made with acidic fruits, such as orange or grapefruit. Tomato juice. Alcoholic drinks. Sweets and desserts Chocolate and cocoa. Donuts. Seasonings and condiments Pepper. Peppermint and spearmint. Added salt. Any condiments, herbs, or seasonings that cause symptoms. For some people, this may include curry, hot sauce, or vinegar-based salad dressings. The items listed above may not be a complete list of foods and beverages to avoid. Contact a dietitian for more information. Questions to ask your health care provider Diet and lifestyle changes are usually the first steps that are taken to manage symptoms of GERD. If diet and lifestyle changes do not improve your symptoms, talk with your health care provider about taking medicines. Where to find more information International Foundation for Gastrointestinal Disorders: aboutgerd.org Summary When you have gastroesophageal reflux disease (GERD), food and lifestyle choices may be very helpful in easing the discomfort of GERD. Eat frequent, small meals instead of three large meals each day. Eat your meals slowly, in a relaxed setting. Avoid bending over or lying down until 2-3 hours after eating. Limit high-fat foods such as fatty meats or fried foods. This information is not intended to replace advice given to you by your health care provider. Make sure you discuss any questions you have with your health care  provider. Document Revised: 04/26/2020 Document Reviewed: 04/26/2020 Elsevier Patient Education  2024 [employer].

## 2023-08-28 ENCOUNTER — Encounter: Payer: Self-pay | Admitting: Family Medicine

## 2023-08-28 LAB — COMPREHENSIVE METABOLIC PANEL
ALT: 15 U/L (ref 0–53)
AST: 29 U/L (ref 0–37)
Albumin: 3.8 g/dL (ref 3.5–5.2)
Alkaline Phosphatase: 76 U/L (ref 39–117)
BUN: 14 mg/dL (ref 6–23)
CO2: 29 meq/L (ref 19–32)
Calcium: 9.2 mg/dL (ref 8.4–10.5)
Chloride: 103 meq/L (ref 96–112)
Creatinine, Ser: 1.13 mg/dL (ref 0.40–1.50)
GFR: 71.2 mL/min (ref >60.00)
Glucose, Bld: 84 mg/dL (ref 70–99)
Potassium: 4.5 meq/L (ref 3.5–5.1)
Sodium: 139 meq/L (ref 135–145)
Total Bilirubin: 0.4 mg/dL (ref 0.2–1.2)
Total Protein: 7.5 g/dL (ref 6.0–8.3)

## 2023-08-28 LAB — CBC
HCT: 48.5 % (ref 39.0–52.0)
Hemoglobin: 15.6 g/dL (ref 13.0–17.0)
MCHC: 32.2 g/dL (ref 30.0–36.0)
MCV: 102.8 fL — ABNORMAL HIGH (ref 78.0–100.0)
Platelets: 224 10*3/uL (ref 150.0–400.0)
RBC: 4.72 Mil/uL (ref 4.22–5.81)
RDW: 14.8 % (ref 11.5–15.5)
WBC: 5.1 10*3/uL (ref 4.0–10.5)

## 2023-09-10 ENCOUNTER — Ambulatory Visit: Payer: Medicare Other | Admitting: Family Medicine

## 2023-09-10 ENCOUNTER — Encounter: Payer: Self-pay | Admitting: Family Medicine

## 2023-09-10 VITALS — BP 118/70 | HR 63 | Temp 99.2°F | Ht 62.0 in | Wt 170.4 lb

## 2023-09-10 DIAGNOSIS — R0982 Postnasal drip: Secondary | ICD-10-CM | POA: Diagnosis not present

## 2023-09-10 DIAGNOSIS — R059 Cough, unspecified: Secondary | ICD-10-CM | POA: Diagnosis not present

## 2023-09-10 DIAGNOSIS — R12 Heartburn: Secondary | ICD-10-CM

## 2023-09-10 MED ORDER — FLUTICASONE PROPIONATE 50 MCG/ACT NA SUSP
1.0000 | Freq: Every day | NASAL | 2 refills | Status: AC
Start: 2023-09-10 — End: ?

## 2023-09-10 NOTE — Progress Notes (Signed)
Subjective:  Patient ID: Steven [name], male    DOB: [birth year]  Age: 57 y.o. MRN: [id number]  CC:  Chief Complaint  Patient presents with   Gastroesophageal Reflux    Pt father notes he seems to be much improved. Notes some coughing and a little phlem expelled over the last week     HPI Steven [name] presents for    GERD/heartburn: See prior notes.  Has been treated by urgent care with omeprazole and famotidine.  Recurrence off medication.  CBC, CMP overall reassuring last visit on October 28.  No red flag symptoms.  Reassuring exam.  Possible component of gastritis with GERD, less likely peptic ulcer disease.  Was improving with PPI and H2 blocker previously, restart omeprazole 20 mg daily and famotidine twice daily.  Normal BM. No dark stools. Belching after meals. Avoiding triggers, taking PPI every day, H2 blocker BID. Slight  cough past week, clearing phlegm. Notes after getting into car for a drive. Min nasal congestion. No fevers or dyspnea.  Overall feels better.    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/22/2022   Sinus pause 11/22/2022   Sinus arrest 11/22/2022   Sleep apnea 11/22/2022   Obesity (BMI 35.0-39.9 without comorbidity) 11/22/2022   Postural dizziness with presyncope 10/21/2022   Junctional bradycardia 10/21/2022   Incarcerated incisional hernia 07/17/2017   Past Medical History:  Diagnosis Date   Complication of anesthesia    per patient father, patient was slow to wake with a previous surgery    Down syndrome    Incisional hernia    MR (mental retardation)    Past Surgical History:  Procedure Laterality Date   HERNIA REPAIR     unaware of the year of , maybe 10 years ago    INCISIONAL HERNIA REPAIR N/A 07/17/2017   Procedure: LAPAROSCOPIC INCISIONAL HERNIA WITH MESH;  Surgeon: Abigail Miyamoto, MD;  Location: [facility];  Service: General;  Laterality: N/A;   INSERTION OF MESH N/A  07/17/2017   Procedure: INSERTION OF MESH;  Surgeon: Abigail Miyamoto, MD;  Location: [facility];  Service: General;  Laterality: N/A;   No Known Allergies Prior to Admission medications   Medication Sig Start Date End Date Taking? Authorizing Provider  alum & mag hydroxide-simeth (MAALOX MAX) 400-400-40 MG/5ML suspension Take 5 mLs by mouth every 6 (six) hours as needed for indigestion. 07/14/23  Yes Wallis Bamberg, PA-C  Cholecalciferol (VITAMIN D PO) Take 5,000 Units by mouth daily.    Yes [provider]  famotidine (PEPCID) 20 MG tablet Take 1 tablet (20 mg total) by mouth 2 (two) times daily. 08/27/23  Yes Shade Flood, MD  Multiple Vitamin (MULTIVITAMIN WITH MINERALS) TABS tablet Take 1 tablet by mouth daily.   Yes [provider]  Omega-3 Fatty Acids (OMEGA-3 CF PO) Take 1,600 mg by mouth daily.   Yes [provider]  omeprazole (PRILOSEC) 20 MG capsule Take 1 capsule (20 mg total) by mouth daily. 08/27/23  Yes Shade Flood, MD  triamcinolone cream (KENALOG) 0.1 % Apply 1 Application topically 2 (two) times daily. 04/20/23  Yes Shade Flood, MD   Social History   Socioeconomic History   Marital status: Single    Spouse name: Not on file   Number of children: Not on file   Years of education: Not on file   Highest education level: Not on file  Occupational History   Not on file  Tobacco  Use   Smoking status: Never   Smokeless tobacco: Never  Vaping Use   Vaping status: Never Used  Substance and Sexual Activity   Alcohol use: Not Currently    Comment: occ   Drug use: No   Sexual activity: Not on file  Other Topics Concern   Not on file  Social History Narrative   Not on file   Social Determinants of Health   Financial Resource Strain: Not on file  Food Insecurity: Not on file  Transportation Needs: Not on file  Physical Activity: Not on file  Stress: Not on file  Social Connections: Not on file  Intimate Partner Violence: Not on  file    Review of Systems Per HPI   Objective:   Vitals:   09/10/23 1621  BP: 118/70  Pulse: 63  Temp: 99.2 F (37.3 C)  TempSrc: Temporal  SpO2: 97%  Weight: 170 lb 6.4 oz (77.3 kg)  Height: 5\' 2"  (1.575 m)     Physical Exam Vitals reviewed.  Constitutional:      Appearance: He is well-developed.  HENT:     Head: Normocephalic and atraumatic.  Neck:     Vascular: No carotid bruit or JVD.  Cardiovascular:     Rate and Rhythm: Normal rate and regular rhythm.     Heart sounds: Normal heart sounds. No murmur heard. Pulmonary:     Effort: Pulmonary effort is normal.     Breath sounds: Normal breath sounds. No rales.  Musculoskeletal:     Right lower leg: No edema.     Left lower leg: No edema.  Skin:    General: Skin is warm and dry.  Neurological:     Mental Status: He is alert and oriented to person, place, and time.  Psychiatric:        Mood and Affect: Mood normal.        Assessment & Plan:  Steven [name] is a 57 y.o. male . Cough, unspecified type  Heartburn - Plan: Ambulatory referral to Gastroenterology  Postnasal drip - Plan: fluticasone (FLONASE) 50 MCG/ACT nasal spray  Epigastric pain, heartburn has improved with use of PPI, H2 blocker and trigger avoidance.  Still some cough, which may be either laryngeal pharyngeal reflux or postnasal drip although no typical allergy symptoms.  Will try Flonase with appropriate technique discussed.  Let me know if that is not improving cough and throat clearing in the next few weeks but will also refer to gastroenterology.  RTC precautions given.  Meds ordered this encounter  Medications   fluticasone (FLONASE) 50 MCG/ACT nasal spray    Sig: Place 1 spray into both nostrils daily.    Dispense:  16 g    Refill:  2   Patient Instructions  Continue same medications for heartburn.  I will refer you to gastroenterology to decide if other testing or treatment is needed, especially if the cough does not improve  with the nasal spray.  With the occasional nasal congestion and throat clearing, try Flonase 1 spray in each nostril once per day in case there may be an allergy component.  Let me know if that does not help.  If any new or worsening symptoms be seen but I am glad to hear that you are feeling better.      Signed,   Meredith Staggers, MD [facility], [facility] 09/10/23 4:47 PM

## 2023-09-10 NOTE — Patient Instructions (Signed)
Continue same medications for heartburn.  I will refer you to gastroenterology to decide if other testing or treatment is needed, especially if the cough does not improve with the nasal spray.  With the occasional nasal congestion and throat clearing, try Flonase 1 spray in each nostril once per day in case there may be an allergy component.  Let me know if that does not help.  If any new or worsening symptoms be seen but I am glad to hear that you are feeling better.

## 2023-10-17 ENCOUNTER — Other Ambulatory Visit: Payer: Self-pay | Admitting: Family Medicine

## 2023-10-17 DIAGNOSIS — R1013 Epigastric pain: Secondary | ICD-10-CM

## 2023-10-17 DIAGNOSIS — R12 Heartburn: Secondary | ICD-10-CM

## 2023-10-29 DIAGNOSIS — R0600 Dyspnea, unspecified: Secondary | ICD-10-CM | POA: Diagnosis not present

## 2023-10-29 DIAGNOSIS — R1013 Epigastric pain: Secondary | ICD-10-CM | POA: Diagnosis not present

## 2023-10-30 ENCOUNTER — Other Ambulatory Visit: Payer: Self-pay | Admitting: Gastroenterology

## 2023-10-30 DIAGNOSIS — R1011 Right upper quadrant pain: Secondary | ICD-10-CM

## 2023-11-01 ENCOUNTER — Ambulatory Visit
Admission: RE | Admit: 2023-11-01 | Discharge: 2023-11-01 | Disposition: A | Discharge status: Home / Self Care | Payer: Medicare Other | Source: Ambulatory Visit | Attending: Gastroenterology | Admitting: Gastroenterology

## 2023-11-01 DIAGNOSIS — R1011 Right upper quadrant pain: Secondary | ICD-10-CM

## 2023-11-07 ENCOUNTER — Other Ambulatory Visit: Payer: Self-pay | Admitting: Gastroenterology

## 2023-11-30 ENCOUNTER — Encounter (HOSPITAL_COMMUNITY): Payer: Self-pay | Admitting: Gastroenterology

## 2023-12-07 ENCOUNTER — Ambulatory Visit (HOSPITAL_COMMUNITY)
Admission: RE | Admit: 2023-12-07 | Discharge: 2023-12-07 | Disposition: A | Discharge status: Home / Self Care | Payer: Medicare Other | Attending: Gastroenterology | Admitting: Gastroenterology

## 2023-12-07 ENCOUNTER — Encounter (HOSPITAL_COMMUNITY): Payer: Self-pay | Admitting: Gastroenterology

## 2023-12-07 ENCOUNTER — Ambulatory Visit (HOSPITAL_BASED_OUTPATIENT_CLINIC_OR_DEPARTMENT_OTHER): Payer: Medicare Other

## 2023-12-07 ENCOUNTER — Encounter (HOSPITAL_COMMUNITY)
Admission: RE | Disposition: A | Discharge status: Home / Self Care | Payer: Self-pay | Source: Home / Self Care | Attending: Gastroenterology

## 2023-12-07 ENCOUNTER — Ambulatory Visit (HOSPITAL_COMMUNITY): Payer: Medicare Other

## 2023-12-07 ENCOUNTER — Other Ambulatory Visit: Payer: Self-pay

## 2023-12-07 DIAGNOSIS — K3189 Other diseases of stomach and duodenum: Secondary | ICD-10-CM

## 2023-12-07 DIAGNOSIS — Q909 Down syndrome, unspecified: Secondary | ICD-10-CM | POA: Insufficient documentation

## 2023-12-07 DIAGNOSIS — R1013 Epigastric pain: Secondary | ICD-10-CM | POA: Insufficient documentation

## 2023-12-07 DIAGNOSIS — F79 Unspecified intellectual disabilities: Secondary | ICD-10-CM | POA: Insufficient documentation

## 2023-12-07 DIAGNOSIS — K2950 Unspecified chronic gastritis without bleeding: Secondary | ICD-10-CM | POA: Insufficient documentation

## 2023-12-07 DIAGNOSIS — K319 Disease of stomach and duodenum, unspecified: Secondary | ICD-10-CM | POA: Diagnosis not present

## 2023-12-07 DIAGNOSIS — G4733 Obstructive sleep apnea (adult) (pediatric): Secondary | ICD-10-CM | POA: Insufficient documentation

## 2023-12-07 HISTORY — PX: BIOPSY: SHX5522

## 2023-12-07 HISTORY — PX: ESOPHAGOGASTRODUODENOSCOPY (EGD) WITH PROPOFOL: SHX5813

## 2023-12-07 SURGERY — ESOPHAGOGASTRODUODENOSCOPY (EGD) WITH PROPOFOL
Anesthesia: Monitor Anesthesia Care

## 2023-12-07 MED ORDER — PROPOFOL 10 MG/ML IV BOLUS
INTRAVENOUS | Status: DC | PRN
  Administered 2023-12-07 (×2): 40 mg via INTRAVENOUS

## 2023-12-07 MED ORDER — LIDOCAINE 2% (20 MG/ML) 5 ML SYRINGE
INTRAMUSCULAR | Status: DC | PRN
  Administered 2023-12-07: 100 mg via INTRAVENOUS

## 2023-12-07 MED ORDER — PROPOFOL 10 MG/ML IV BOLUS
INTRAVENOUS | Status: AC
  Filled 2023-12-07: qty 20

## 2023-12-07 MED ORDER — GLYCOPYRROLATE 0.2 MG/ML IJ SOLN
INTRAMUSCULAR | Status: DC | PRN
  Administered 2023-12-07: .2 mg via INTRAVENOUS

## 2023-12-07 MED ORDER — EPHEDRINE SULFATE-NACL 50-0.9 MG/10ML-% IV SOSY
PREFILLED_SYRINGE | INTRAVENOUS | Status: DC | PRN
  Administered 2023-12-07: 15 mg via INTRAVENOUS

## 2023-12-07 SURGICAL SUPPLY — 14 items

## 2023-12-07 NOTE — Transfer of Care (Signed)
 Immediate Anesthesia Transfer of Care Note  Patient: Steven [name]  Procedure(s) Performed: ESOPHAGOGASTRODUODENOSCOPY (EGD) WITH PROPOFOL  BIOPSY  Patient Location: PACU  Anesthesia Type:MAC  Level of Consciousness: awake  Airway & Oxygen Therapy: Patient Spontanous Breathing and Patient connected to face mask oxygen  Post-op Assessment: Report given to RN and Post -op Vital signs reviewed and stable  Post vital signs: Reviewed and stable  Last Vitals:  Vitals Value Taken Time  BP    Temp    Pulse 45 12/07/23 1122  Resp 18 12/07/23 1122  SpO2 100 % 12/07/23 1122  Vitals shown include unfiled device data.  Last Pain:  Vitals:   12/07/23 1003  TempSrc: Temporal  PainSc: 0-No pain         Complications: No notable events documented.

## 2023-12-07 NOTE — Anesthesia Postprocedure Evaluation (Signed)
 Anesthesia Post Note  Patient: Kaito Schulenburg  Procedure(s) Performed: ESOPHAGOGASTRODUODENOSCOPY (EGD) WITH PROPOFOL  BIOPSY     Patient location during evaluation: Endoscopy Anesthesia Type: MAC Level of consciousness: awake and alert Pain management: pain level controlled Vital Signs Assessment: post-procedure vital signs reviewed and stable Respiratory status: spontaneous breathing, nonlabored ventilation, respiratory function stable and patient connected to nasal cannula oxygen Cardiovascular status: blood pressure returned to baseline and stable Postop Assessment: no apparent nausea or vomiting Anesthetic complications: no  No notable events documented.  Last Vitals:  Vitals:   12/07/23 1140 12/07/23 1145  BP: (!) 114/53   Pulse: (!) 55 64  Resp: 16 19  Temp:    SpO2: 100% 100%    Last Pain:  Vitals:   12/07/23 1122  TempSrc: Temporal  PainSc:                  Jizel Cheeks L Francena Zender

## 2023-12-07 NOTE — Anesthesia Procedure Notes (Signed)
 Procedure Name: MAC Date/Time: 12/07/2023 11:00 AM  Performed by: Dalton Duff, CRNA

## 2023-12-07 NOTE — H&P (Signed)
 Steven [name] HPI: In September he presented to urgent care for epigastric pain complaints.  He was treated with omeprazle 20 mg QD and famotidine  20 mg BID with good symptom control, but his symptoms continue to persist.  Prior to this September, his parents do not report any problems with abdominal pain.  There is an intermitent variability with the abdominal pain.  It is morning and evening hours that cause him severe abdominal pain.  On average the abdominal pain lasts for 15 minutes.  Peptobismol does help.  Spicey foods also tend to trigger his symptoms.  On 05/2020 he had a normal Cologuard result.  The patient has a history of severe OSA.  Past Medical History:  Diagnosis Date   Complication of anesthesia    per patient father, patient was slow to wake with a previous surgery    Down syndrome    Incisional hernia    MR (mental retardation)     Past Surgical History:  Procedure Laterality Date   HERNIA REPAIR     unaware of the year of , maybe 10 years ago    INCISIONAL HERNIA REPAIR N/A 07/17/2017   Procedure: LAPAROSCOPIC INCISIONAL HERNIA WITH MESH;  Surgeon: Vernetta Berg, MD;  Location: [facility];  Service: General;  Laterality: N/A;   INSERTION OF MESH N/A 07/17/2017   Procedure: INSERTION OF MESH;  Surgeon: Vernetta Berg, MD;  Location: [facility];  Service: General;  Laterality: N/A;    Family History  Problem Relation Age of Onset   Diabetes Father    Hypertension Neg Hx    Heart disease Neg Hx    Cancer Neg Hx     Social History:  reports that he has never smoked. He has never used smokeless tobacco. He reports that he does not currently use alcohol. He reports that he does not use drugs.  Allergies: No Known Allergies  Medications: Scheduled: Continuous:  No results found for this or any previous visit (from the past 24 hours).   No results found.  ROS:  As stated above in the HPI otherwise negative.  Blood pressure 109/62, pulse (!) 41, temperature 98 F  (36.7 C), temperature source Temporal, resp. rate 12, height 5' (1.524 m), weight 77.1 kg, SpO2 100%.    PE: Gen: NAD, Alert and Oriented HEENT:  [place]/AT, EOMI Neck: Supple, no LAD Lungs: CTA Bilaterally CV: RRR without M/G/R ABD: Soft, NTND, +BS Ext: No C/C/E  Assessment/Plan: 1) Epigastric pain - EGD.  Steven [name] D 12/07/2023, 10:54 AM

## 2023-12-07 NOTE — Discharge Instructions (Signed)

## 2023-12-07 NOTE — Op Note (Signed)
 [facility] Patient Name: Steven [name] Procedure Date: 12/07/2023 MRN: [id number] Attending MD: Belvie Just , MD, [id number] Date of Birth: 1966-05-15 CSN: [id number] Age: 58 Admit Type: Outpatient Procedure:                Upper GI endoscopy Indications:              Epigastric abdominal pain Providers:                Belvie Just, MD, Ozell Pouch, Lorrayne Kitty,                            Technician Referring MD:             Belvie Just, MD Medicines:                Propofol  per Anesthesia Complications:            No immediate complications. Estimated Blood Loss:     Estimated blood loss: none. Procedure:                Pre-Anesthesia Assessment:                           - Prior to the procedure, a History and Physical                            was performed, and patient medications and                            allergies were reviewed. The patient's tolerance of                            previous anesthesia was also reviewed. The risks                            and benefits of the procedure and the sedation                            options and risks were discussed with the patient.                            All questions were answered, and informed consent                            was obtained. Prior Anticoagulants: The patient has                            taken no anticoagulant or antiplatelet agents. ASA                            Grade Assessment: III - A patient with severe                            systemic disease. After reviewing the risks and  benefits, the patient was deemed in satisfactory                            condition to undergo the procedure.                           - Sedation was administered by an anesthesia                            professional. Deep sedation was attained.                           After obtaining informed consent, the endoscope was                            passed under direct  vision. Throughout the                            procedure, the patient's blood pressure, pulse, and                            oxygen saturations were monitored continuously. The                            [id number]-[id number] ([id number]) Olympus endoscope was introduced                            through the mouth, and advanced to the second part                            of duodenum. The upper GI endoscopy was                            accomplished without difficulty. The patient                            tolerated the procedure well. Scope In: Scope Out: Findings:      The esophagus was normal.      A single localized small erosion with no stigmata of recent bleeding was       found in the gastric antrum. Biopsies were taken with a cold forceps for       Helicobacter pylori testing.      The examined duodenum was normal. Impression:               - Normal esophagus.                           - Erosive gastropathy with no stigmata of recent                            bleeding. Biopsied.                           - Normal examined duodenum. Moderate Sedation:      Not Applicable - Patient had care per Anesthesia. Recommendation:           -  Patient has a contact number available for                            emergencies. The signs and symptoms of potential                            delayed complications were discussed with the                            patient. Return to normal activities tomorrow.                            Written discharge instructions were provided to the                            patient.                           - Resume previous diet.                           - Continue present medications.                           - Await pathology results.                           - Trial of an PPI.                           - Schedule HIDA scan.                           - Follow up in the office in one month. Procedure Code(s):        --- Professional ---                            [phone], Esophagogastroduodenoscopy, flexible,                            transoral; with biopsy, single or multiple Diagnosis Code(s):        --- Professional ---                           R10.13, Epigastric pain                           K31.89, Other diseases of stomach and duodenum CPT copyright 2022 [employer]. All rights reserved. The codes documented in this report are preliminary and upon coder review may  be revised to meet current compliance requirements. Belvie Just, MD Belvie Just, MD 12/07/2023 11:21:41 AM This report has been signed electronically. Number of Addenda: 0

## 2023-12-07 NOTE — Anesthesia Preprocedure Evaluation (Addendum)
 Anesthesia Evaluation  Patient identified by MRN, date of birth, ID band Patient awake    Reviewed: Allergy & Precautions, NPO status , Patient's Chart, lab work & pertinent test results  Airway Mallampati: III  TM Distance: >3 FB Neck ROM: Full    Dental  (+) Dental Advisory Given, Missing,    Pulmonary sleep apnea (no CPAP)    Pulmonary exam normal breath sounds clear to auscultation       Cardiovascular negative cardio ROS Normal cardiovascular exam Rhythm:Regular Rate:Normal  TTE 2023 1. Left ventricular ejection fraction, by estimation, is 60 to 65%. The  left ventricle has normal function. The left ventricle has no regional  wall motion abnormalities. There is mild concentric left ventricular  hypertrophy. Left ventricular diastolic  function could not be evaluated.   2. Right ventricular systolic function is low normal. The right  ventricular size is normal. Tricuspid regurgitation signal is inadequate  for assessing PA pressure.   3. The mitral valve is grossly normal. No evidence of mitral valve  regurgitation. No evidence of mitral stenosis.   4. The aortic valve is tricuspid. Aortic valve regurgitation is mild.  Aortic valve sclerosis is present, with no evidence of aortic valve  stenosis.   5. The inferior vena cava is normal in size with greater than 50%  respiratory variability, suggesting right atrial pressure of 3 mmHg.     Neuro/Psych negative neurological ROS  negative psych ROS   GI/Hepatic negative GI ROS, Neg liver ROS,,,  Endo/Other  negative endocrine ROS    Renal/GU negative Renal ROS  negative genitourinary   Musculoskeletal negative musculoskeletal ROS (+)    Abdominal   Peds Down sydrome   Hematology negative hematology ROS (+)   Anesthesia Other Findings   Reproductive/Obstetrics                             Anesthesia Physical Anesthesia Plan  ASA:  2  Anesthesia Plan: MAC   Post-op Pain Management:    Induction: Intravenous  PONV Risk Score and Plan: Propofol  infusion and Treatment may vary due to age or medical condition  Airway Management Planned: Natural Airway  Additional Equipment:   Intra-op Plan:   Post-operative Plan:   Informed Consent: I have reviewed the patients History and Physical, chart, labs and discussed the procedure including the risks, benefits and alternatives for the proposed anesthesia with the patient or authorized representative who has indicated his/her understanding and acceptance.     Dental advisory given  Plan Discussed with: CRNA  Anesthesia Plan Comments:        Anesthesia Quick Evaluation

## 2023-12-10 ENCOUNTER — Other Ambulatory Visit (HOSPITAL_COMMUNITY): Payer: Self-pay | Admitting: Gastroenterology

## 2023-12-10 DIAGNOSIS — R1011 Right upper quadrant pain: Secondary | ICD-10-CM

## 2023-12-10 LAB — SURGICAL PATHOLOGY

## 2023-12-17 DIAGNOSIS — H52223 Regular astigmatism, bilateral: Secondary | ICD-10-CM | POA: Diagnosis not present

## 2023-12-17 DIAGNOSIS — H0264 Xanthelasma of left upper eyelid: Secondary | ICD-10-CM | POA: Diagnosis not present

## 2023-12-17 DIAGNOSIS — H34831 Tributary (branch) retinal vein occlusion, right eye, with macular edema: Secondary | ICD-10-CM | POA: Diagnosis not present

## 2023-12-17 DIAGNOSIS — H524 Presbyopia: Secondary | ICD-10-CM | POA: Diagnosis not present

## 2023-12-17 DIAGNOSIS — H0262 Xanthelasma of right lower eyelid: Secondary | ICD-10-CM | POA: Diagnosis not present

## 2023-12-17 DIAGNOSIS — H0261 Xanthelasma of right upper eyelid: Secondary | ICD-10-CM | POA: Diagnosis not present

## 2023-12-21 ENCOUNTER — Ambulatory Visit (HOSPITAL_COMMUNITY)
Admission: RE | Admit: 2023-12-21 | Discharge: 2023-12-21 | Disposition: A | Discharge status: Home / Self Care | Payer: Medicare Other | Source: Ambulatory Visit | Attending: Gastroenterology | Admitting: Gastroenterology

## 2023-12-21 DIAGNOSIS — R1011 Right upper quadrant pain: Secondary | ICD-10-CM | POA: Diagnosis not present

## 2023-12-21 MED ORDER — TECHNETIUM TC 99M MEBROFENIN IV KIT
5.5000 | PACK | Freq: Once | INTRAVENOUS | Status: AC
  Administered 2023-12-21: 5.5 via INTRAVENOUS

## 2024-01-25 ENCOUNTER — Encounter: Payer: Self-pay | Admitting: Family Medicine

## 2024-01-25 ENCOUNTER — Ambulatory Visit (INDEPENDENT_AMBULATORY_CARE_PROVIDER_SITE_OTHER): Admitting: Family Medicine

## 2024-01-25 VITALS — BP 112/66 | HR 42 | Temp 98.2°F | Ht 60.0 in | Wt 167.0 lb

## 2024-01-25 DIAGNOSIS — R1032 Left lower quadrant pain: Secondary | ICD-10-CM

## 2024-01-25 DIAGNOSIS — K5900 Constipation, unspecified: Secondary | ICD-10-CM | POA: Diagnosis not present

## 2024-01-25 NOTE — Progress Notes (Signed)
 Subjective:  Patient ID: Steven [name], male    DOB: 04/09/1966  Age: 58 y.o. MRN: [id number]  CC:  Chief Complaint  Patient presents with   Abdominal Pain    Pt notes 1 month ago started having pain in lower abdomen, especially following meals, has been constipated for 5 days had a BM this morning, pt had a scope a month ago and has had regular issues with constipations worried about impacted colon     HPI Steven [name] presents for   Abdominal pain Here with family.  As above, past month, lower abdominal pain especially following meals.  Does have history of constipation, had bowel movement this morning but constipated for 5 days prior - no BM until this am - 1 large stool. No blood.  Recurrent issues with constipation.  2-3 times per day will have cramp, then feeling like he needs to use bathroom. Miralax once per day for past week, senna-s for a few days prior.  No daily med for BM or fiber supplements. No fever, nausea, vomiting.  No regular schedule for BM.    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/22/2022   Sinus pause 11/22/2022   Sinus arrest 11/22/2022   Sleep apnea 11/22/2022   Obesity (BMI 35.0-39.9 without comorbidity) 11/22/2022   Postural dizziness with presyncope 10/21/2022   Junctional bradycardia 10/21/2022   Incarcerated incisional hernia 07/17/2017   Past Medical History:  Diagnosis Date   Complication of anesthesia    per patient father, patient was slow to wake with a previous surgery    Down syndrome    Incisional hernia    MR (mental retardation)    Past Surgical History:  Procedure Laterality Date   BIOPSY  12/07/2023   Procedure: BIOPSY;  Surgeon: Jeani Hawking, MD;  Location: [facility];  Service: Gastroenterology;;   ESOPHAGOGASTRODUODENOSCOPY (EGD) WITH PROPOFOL N/A 12/07/2023   Procedure: ESOPHAGOGASTRODUODENOSCOPY (EGD) WITH PROPOFOL;  Surgeon: Jeani Hawking, MD;  Location: [facility];  Service: Gastroenterology;  Laterality: N/A;   HERNIA REPAIR     unaware of the year of , maybe 10 years ago    INCISIONAL HERNIA REPAIR N/A 07/17/2017   Procedure: LAPAROSCOPIC INCISIONAL HERNIA WITH MESH;  Surgeon: Abigail Miyamoto, MD;  Location: [facility];  Service: General;  Laterality: N/A;   INSERTION OF MESH N/A 07/17/2017   Procedure: INSERTION OF MESH;  Surgeon: Abigail Miyamoto, MD;  Location: [facility];  Service: General;  Laterality: N/A;   No Known Allergies Prior to Admission medications   Medication Sig Start Date End Date Taking? Authorizing Provider  alum & mag hydroxide-simeth (MAALOX MAX) 400-400-40 MG/5ML suspension Take 5 mLs by mouth every 6 (six) hours as needed for indigestion. 07/14/23  Yes Wallis Bamberg, PA-C  Cholecalciferol (VITAMIN D PO) Take 5,000 Units by mouth daily.    Yes [provider]  famotidine (PEPCID) 20 MG tablet Take 1 tablet (20 mg total) by mouth 2 times daily. 10/17/23  Yes Shade Flood, MD  fluticasone (FLONASE) 50 MCG/ACT nasal spray Place 1 spray into both nostrils daily. 09/10/23  Yes Shade Flood, MD  Multiple Vitamin (MULTIVITAMIN WITH MINERALS) TABS tablet Take 1 tablet by mouth daily.   Yes [provider]  Omega-3 Fatty Acids (OMEGA-3 CF PO) Take 1,600 mg by mouth daily.   Yes [provider]  omeprazole (PRILOSEC) 20 MG capsule Take 1 capsule (20 mg total) by mouth daily. 08/27/23  Yes Neva Seat,  Asencion Partridge, MD  triamcinolone cream (KENALOG) 0.1 % Apply 1 Application topically 2 (two) times daily. 04/20/23  Yes Shade Flood, MD   Social History   Socioeconomic History   Marital status: Single    Spouse name: Not on file   Number of children: Not on file   Years of education: Not on file   Highest education level: Not on file  Occupational History   Not on file  Tobacco Use   Smoking status: Never   Smokeless tobacco: Never  Vaping Use   Vaping status: Never Used  Substance and Sexual  Activity   Alcohol use: Not Currently    Comment: occ   Drug use: No   Sexual activity: Not on file  Other Topics Concern   Not on file  Social History Narrative   Not on file   Social Drivers of Health   Financial Resource Strain: Not on file  Food Insecurity: Not on file  Transportation Needs: Not on file  Physical Activity: Not on file  Stress: Not on file  Social Connections: Not on file  Intimate Partner Violence: Not on file    Review of Systems Per hpi.   Objective:   Vitals:   01/25/24 0943  BP: 112/66  Pulse: (!) 42  Temp: 98.2 F (36.8 C)  TempSrc: Temporal  SpO2: 100%  Weight: 167 lb (75.8 kg)  Height: 5' (1.524 m)     Physical Exam Vitals reviewed.  Constitutional:      Appearance: He is well-developed.  HENT:     Head: Normocephalic and atraumatic.  Neck:     Vascular: No carotid bruit or JVD.  Cardiovascular:     Rate and Rhythm: Normal rate and regular rhythm.     Heart sounds: Normal heart sounds. No murmur heard. Pulmonary:     Effort: Pulmonary effort is normal.     Breath sounds: Normal breath sounds. No rales.  Abdominal:     General: There is no distension.     Tenderness: There is abdominal tenderness (Slight discomfort with deep palpation of left lower quadrant, suprapubic area but no rebound or guarding.).  Musculoskeletal:     Right lower leg: No edema.     Left lower leg: No edema.  Skin:    General: Skin is warm and dry.  Neurological:     Mental Status: He is alert and oriented to person, place, and time.  Psychiatric:        Mood and Affect: Mood normal.        Assessment & Plan:  Steven [name] is a 58 y.o. male . LLQ abdominal pain  Constipation, unspecified constipation type Suspected constipation, concern for fecal impaction up until today but did have bowel movement this morning.  MiraLAX twice per day for now until regular bowel movements, had fiber supplement, increase fluid intake, bowel regimen after  meals discussed.  ER precautions if any worsening abdominal pain or inability to pass bowel movements in the next few days with RTC/ER precautions if persistent symptoms.  Handout given, understanding of plan expressed by patient, family.  No orders of the defined types were placed in this encounter.  Patient Instructions  I do think your symptoms are likely from constipation.  Glad to hear there was a bowel movement this morning.  For now take MiraLAX twice per day until regular bowel movements, then stop.  Citrucel or Metamucil once per day as fiber supplement, but try to get fiber naturally through diet  with fruits and vegetables, whole grains.  Make sure to drink plenty of water, flavored water is fine if that is easier.  See other information below on constipation.  Try to sit on the toilet 10 to 15 minutes after meals to see if that will help with natural restart of bowel movements as well.  If worsening pain this weekend or unable to have further bowel movements in the next few days, emergency room evaluation may be needed for possible impaction but encouraged that you did have a bowel movement this morning.  Take care!    Return to the clinic or go to the nearest emergency room if any of your symptoms worsen or new symptoms occur. Constipation, Adult Constipation is when a person has fewer than three bowel movements in a week, has difficulty having a bowel movement, or has stools (feces) that are dry, hard, or larger than normal. Constipation may be caused by an underlying condition. It may become worse with age if a person takes certain medicines and does not take in enough fluids. Follow these instructions at home: Eating and drinking  Eat foods that have a lot of fiber, such as beans, whole grains, and fresh fruits and vegetables. Limit foods that are low in fiber and high in fat and processed sugars, such as fried or sweet foods. These include french fries, hamburgers, cookies,  candies, and soda. Drink enough fluid to keep your urine pale yellow. General instructions Exercise regularly or as told by your health care provider. Try to do 150 minutes of moderate exercise each week. Use the bathroom when you have the urge to go. Do not hold it in. Take over-the-counter and prescription medicines only as told by your health care provider. This includes any fiber supplements. During bowel movements: Practice deep breathing while relaxing the lower abdomen. Practice pelvic floor relaxation. Watch your condition for any changes. Let your health care provider know about them. Keep all follow-up visits as told by your health care provider. This is important. Contact a health care provider if: You have pain that gets worse. You have a fever. You do not have a bowel movement after 4 days. You vomit. You are not hungry or you lose weight. You are bleeding from the opening between the buttocks (anus). You have thin, pencil-like stools. Get help right away if: You have a fever and your symptoms suddenly get worse. You leak stool or have blood in your stool. Your abdomen is bloated. You have severe pain in your abdomen. You feel dizzy or you faint. Summary Constipation is when a person has fewer than three bowel movements in a week, has difficulty having a bowel movement, or has stools (feces) that are dry, hard, or larger than normal. Eat foods that have a lot of fiber, such as beans, whole grains, and fresh fruits and vegetables. Drink enough fluid to keep your urine pale yellow. Take over-the-counter and prescription medicines only as told by your health care provider. This includes any fiber supplements. This information is not intended to replace advice given to you by your health care provider. Make sure you discuss any questions you have with your health care provider. Document Revised: 08/30/2022 Document Reviewed: 08/30/2022 Elsevier Patient Education  2024  [employer]    Signed,   Meredith Staggers, MD [facility], [facility] 01/25/24 10:36 AM

## 2024-01-25 NOTE — Patient Instructions (Signed)
 I do think your symptoms are likely from constipation.  Glad to hear there was a bowel movement this morning.  For now take MiraLAX twice per day until regular bowel movements, then stop.  Citrucel or Metamucil once per day as fiber supplement, but try to get fiber naturally through diet with fruits and vegetables, whole grains.  Make sure to drink plenty of water, flavored water is fine if that is easier.  See other information below on constipation.  Try to sit on the toilet 10 to 15 minutes after meals to see if that will help with natural restart of bowel movements as well.  If worsening pain this weekend or unable to have further bowel movements in the next few days, emergency room evaluation may be needed for possible impaction but encouraged that you did have a bowel movement this morning.  Take care!    Return to the clinic or go to the nearest emergency room if any of your symptoms worsen or new symptoms occur. Constipation, Adult Constipation is when a person has fewer than three bowel movements in a week, has difficulty having a bowel movement, or has stools (feces) that are dry, hard, or larger than normal. Constipation may be caused by an underlying condition. It may become worse with age if a person takes certain medicines and does not take in enough fluids. Follow these instructions at home: Eating and drinking  Eat foods that have a lot of fiber, such as beans, whole grains, and fresh fruits and vegetables. Limit foods that are low in fiber and high in fat and processed sugars, such as fried or sweet foods. These include french fries, hamburgers, cookies, candies, and soda. Drink enough fluid to keep your urine pale yellow. General instructions Exercise regularly or as told by your health care provider. Try to do 150 minutes of moderate exercise each week. Use the bathroom when you have the urge to go. Do not hold it in. Take over-the-counter and prescription medicines only as  told by your health care provider. This includes any fiber supplements. During bowel movements: Practice deep breathing while relaxing the lower abdomen. Practice pelvic floor relaxation. Watch your condition for any changes. Let your health care provider know about them. Keep all follow-up visits as told by your health care provider. This is important. Contact a health care provider if: You have pain that gets worse. You have a fever. You do not have a bowel movement after 4 days. You vomit. You are not hungry or you lose weight. You are bleeding from the opening between the buttocks (anus). You have thin, pencil-like stools. Get help right away if: You have a fever and your symptoms suddenly get worse. You leak stool or have blood in your stool. Your abdomen is bloated. You have severe pain in your abdomen. You feel dizzy or you faint. Summary Constipation is when a person has fewer than three bowel movements in a week, has difficulty having a bowel movement, or has stools (feces) that are dry, hard, or larger than normal. Eat foods that have a lot of fiber, such as beans, whole grains, and fresh fruits and vegetables. Drink enough fluid to keep your urine pale yellow. Take over-the-counter and prescription medicines only as told by your health care provider. This includes any fiber supplements. This information is not intended to replace advice given to you by your health care provider. Make sure you discuss any questions you have with your health care provider. Document Revised: 08/30/2022  Document Reviewed: 08/30/2022 Elsevier Patient Education  2024 [employer].

## 2024-01-28 ENCOUNTER — Encounter (HOSPITAL_COMMUNITY): Payer: Self-pay

## 2024-01-28 ENCOUNTER — Emergency Department (HOSPITAL_COMMUNITY)

## 2024-01-28 ENCOUNTER — Emergency Department (HOSPITAL_COMMUNITY)
Admission: EM | Admit: 2024-01-28 | Discharge: 2024-01-28 | Disposition: A | Discharge status: Home / Self Care | Attending: Emergency Medicine | Admitting: Emergency Medicine

## 2024-01-28 ENCOUNTER — Other Ambulatory Visit: Payer: Self-pay

## 2024-01-28 DIAGNOSIS — K5900 Constipation, unspecified: Secondary | ICD-10-CM | POA: Diagnosis not present

## 2024-01-28 DIAGNOSIS — R1030 Lower abdominal pain, unspecified: Secondary | ICD-10-CM | POA: Diagnosis present

## 2024-01-28 DIAGNOSIS — K449 Diaphragmatic hernia without obstruction or gangrene: Secondary | ICD-10-CM | POA: Diagnosis not present

## 2024-01-28 DIAGNOSIS — K529 Noninfective gastroenteritis and colitis, unspecified: Secondary | ICD-10-CM | POA: Insufficient documentation

## 2024-01-28 DIAGNOSIS — R109 Unspecified abdominal pain: Secondary | ICD-10-CM | POA: Diagnosis not present

## 2024-01-28 DIAGNOSIS — R935 Abnormal findings on diagnostic imaging of other abdominal regions, including retroperitoneum: Secondary | ICD-10-CM | POA: Diagnosis not present

## 2024-01-28 LAB — CBC WITH DIFFERENTIAL/PLATELET
Abs Immature Granulocytes: 0.01 10*3/uL (ref 0.00–0.07)
Basophils Absolute: 0.1 10*3/uL (ref 0.0–0.1)
Basophils Relative: 1 %
Eosinophils Absolute: 0 10*3/uL (ref 0.0–0.5)
Eosinophils Relative: 1 %
HCT: 45.7 % (ref 39.0–52.0)
Hemoglobin: 14.7 g/dL (ref 13.0–17.0)
Immature Granulocytes: 0 %
Lymphocytes Relative: 16 %
Lymphs Abs: 0.8 10*3/uL (ref 0.7–4.0)
MCH: 33.4 pg (ref 26.0–34.0)
MCHC: 32.2 g/dL (ref 30.0–36.0)
MCV: 103.9 fL — ABNORMAL HIGH (ref 80.0–100.0)
Monocytes Absolute: 0.5 10*3/uL (ref 0.1–1.0)
Monocytes Relative: 10 %
Neutro Abs: 3.7 10*3/uL (ref 1.7–7.7)
Neutrophils Relative %: 72 %
Platelets: 202 10*3/uL (ref 150–400)
RBC: 4.4 MIL/uL (ref 4.22–5.81)
RDW: 13.8 % (ref 11.5–15.5)
WBC: 5.2 10*3/uL (ref 4.0–10.5)
nRBC: 0 % (ref 0.0–0.2)

## 2024-01-28 LAB — COMPREHENSIVE METABOLIC PANEL WITH GFR
ALT: 15 U/L (ref 0–44)
AST: 25 U/L (ref 15–41)
Albumin: 3.1 g/dL — ABNORMAL LOW (ref 3.5–5.0)
Alkaline Phosphatase: 68 U/L (ref 38–126)
Anion gap: 8 (ref 5–15)
BUN: 14 mg/dL (ref 6–20)
CO2: 26 mmol/L (ref 22–32)
Calcium: 8.9 mg/dL (ref 8.9–10.3)
Chloride: 105 mmol/L (ref 98–111)
Creatinine, Ser: 1.07 mg/dL (ref 0.61–1.24)
GFR, Estimated: >60 mL/min (ref >60)
Glucose, Bld: 84 mg/dL (ref 70–99)
Potassium: 4 mmol/L (ref 3.5–5.1)
Sodium: 139 mmol/L (ref 135–145)
Total Bilirubin: 0.6 mg/dL (ref 0.0–1.2)
Total Protein: 6.7 g/dL (ref 6.5–8.1)

## 2024-01-28 LAB — URINALYSIS, ROUTINE W REFLEX MICROSCOPIC
Bilirubin Urine: NEGATIVE
Glucose, UA: NEGATIVE mg/dL
Hgb urine dipstick: NEGATIVE
Ketones, ur: 5 mg/dL — AB
Leukocytes,Ua: NEGATIVE
Nitrite: NEGATIVE
Protein, ur: NEGATIVE mg/dL
Specific Gravity, Urine: 1.013 (ref 1.005–1.030)
pH: 8 (ref 5.0–8.0)

## 2024-01-28 MED ORDER — AMOXICILLIN-POT CLAVULANATE 875-125 MG PO TABS: 1.0000 | ORAL_TABLET | Freq: Two times a day (BID) | ORAL | 0 refills | Status: DC

## 2024-01-28 MED ORDER — ALIGN PREBIOTIC-PROBIOTIC 5-1.25 MG-GM PO CHEW: 1.0000 | CHEWABLE_TABLET | Freq: Every day | ORAL | 0 refills | Status: DC

## 2024-01-28 MED ORDER — SMOG ENEMA
960.0000 mL | Freq: Once | RECTAL | Status: AC
  Administered 2024-01-28: 960 mL via RECTAL
  Filled 2024-01-28: qty 960

## 2024-01-28 MED ORDER — DICYCLOMINE HCL 10 MG PO CAPS
10.0000 mg | ORAL_CAPSULE | Freq: Once | ORAL | Status: AC
  Administered 2024-01-28: 10 mg via ORAL
  Filled 2024-01-28: qty 1

## 2024-01-28 MED ORDER — ONDANSETRON 4 MG PO TBDP: 4.0000 mg | ORAL_TABLET | Freq: Three times a day (TID) | ORAL | 0 refills | Status: AC | PRN

## 2024-01-28 MED ORDER — AMOXICILLIN-POT CLAVULANATE 875-125 MG PO TABS
1.0000 | ORAL_TABLET | Freq: Once | ORAL | Status: AC
  Administered 2024-01-28: 1 via ORAL
  Filled 2024-01-28: qty 1

## 2024-01-28 MED ORDER — SODIUM CHLORIDE 0.9 % IV BOLUS
1000.0000 mL | Freq: Once | INTRAVENOUS | Status: AC
  Administered 2024-01-28: 1000 mL via INTRAVENOUS

## 2024-01-28 MED ORDER — DICYCLOMINE HCL 20 MG PO TABS: 20.0000 mg | ORAL_TABLET | Freq: Two times a day (BID) | ORAL | 0 refills | Status: DC | PRN

## 2024-01-28 MED ORDER — KETOROLAC TROMETHAMINE 30 MG/ML IJ SOLN
30.0000 mg | Freq: Once | INTRAMUSCULAR | Status: AC
  Administered 2024-01-28: 30 mg via INTRAVENOUS
  Filled 2024-01-28: qty 1

## 2024-01-28 MED ORDER — IOHEXOL 300 MG/ML  SOLN
100.0000 mL | Freq: Once | INTRAMUSCULAR | Status: AC | PRN
  Administered 2024-01-28: 100 mL via INTRAVENOUS

## 2024-01-28 NOTE — Discharge Instructions (Addendum)
 Hold on Miralax

## 2024-01-28 NOTE — ED Provider Notes (Signed)
 [place] EMERGENCY DEPARTMENT AT [facility] Provider Note   CSN: [id number] Arrival date & time: 01/28/24  1103     History  Chief Complaint  Patient presents with   Constipation    Steven [name] is a 58 y.o. male.  Pt is a 58 yo male with pmhx significant for constipation, hernia, and Down Syndrome.  Pt has had chronic problems with constipation and has not had a bowel movement in 2 weeks.  He did see pcp on 3/28 and was told to double up on miralax, but that has not helped.  Pt's parents bring him in because he is not getting any better.  He has been c/o lower abd pain which comes and goes.  No n/v.  Some stool seepage.       Home Medications Prior to Admission medications   Medication Sig Start Date End Date Taking? Authorizing Provider  amoxicillin-clavulanate (AUGMENTIN) 875-125 MG tablet Take 1 tablet by mouth every 12 (twelve) hours. 01/28/24  Yes Jacalyn Lefevre, MD  Bacillus Coagulans-Inulin (ALIGN PREBIOTIC-PROBIOTIC) 5-1.25 MG-GM CHEW Chew 1 capsule by mouth daily. 01/28/24  Yes Jacalyn Lefevre, MD  dicyclomine (BENTYL) 20 MG tablet Take 1 tablet (20 mg total) by mouth 2 (two) times daily as needed for spasms. 01/28/24  Yes Jacalyn Lefevre, MD  ondansetron (ZOFRAN-ODT) 4 MG disintegrating tablet Take 1 tablet (4 mg total) by mouth every 8 (eight) hours as needed. 01/28/24  Yes Jacalyn Lefevre, MD  alum & mag hydroxide-simeth (MAALOX MAX) 400-400-40 MG/5ML suspension Take 5 mLs by mouth every 6 (six) hours as needed for indigestion. 07/14/23   Wallis Bamberg, PA-C  Cholecalciferol (VITAMIN D PO) Take 5,000 Units by mouth daily.     [provider]  famotidine (PEPCID) 20 MG tablet Take 1 tablet (20 mg total) by mouth 2 times daily. 10/17/23   Shade Flood, MD  fluticasone (FLONASE) 50 MCG/ACT nasal spray Place 1 spray into both nostrils daily. 09/10/23   Shade Flood, MD  Multiple Vitamin (MULTIVITAMIN WITH MINERALS) TABS tablet Take 1 tablet by  mouth daily.    [provider]  Omega-3 Fatty Acids (OMEGA-3 CF PO) Take 1,600 mg by mouth daily.    [provider]  omeprazole (PRILOSEC) 20 MG capsule Take 1 capsule (20 mg total) by mouth daily. 08/27/23   Shade Flood, MD  triamcinolone cream (KENALOG) 0.1 % Apply 1 Application topically 2 (two) times daily. 04/20/23   Shade Flood, MD      Allergies    Patient has no known allergies.    Review of Systems   Review of Systems  Gastrointestinal:  Positive for abdominal pain and constipation.  All other systems reviewed and are negative.   Physical Exam Updated Vital Signs BP 105/76   Pulse 71   Temp 98.4 F (36.9 C) (Oral)   Resp 18   Ht 5' (1.524 m)   Wt 75.8 kg   SpO2 100%   BMI 32.61 kg/m  Physical Exam Vitals and nursing note reviewed.  Constitutional:      Comments: Facies of Down Syndrome  HENT:     Head: Normocephalic and atraumatic.     Right Ear: External ear normal.     Left Ear: External ear normal.     Nose: Nose normal.     Mouth/Throat:     Mouth: Mucous membranes are moist.     Pharynx: Oropharynx is clear.  Eyes:     Extraocular Movements: Extraocular movements  intact.     Conjunctiva/sclera: Conjunctivae normal.     Pupils: Pupils are equal, round, and reactive to light.  Cardiovascular:     Rate and Rhythm: Normal rate and regular rhythm.     Pulses: Normal pulses.     Heart sounds: Normal heart sounds.  Pulmonary:     Effort: Pulmonary effort is normal.     Breath sounds: Normal breath sounds.  Abdominal:     General: Abdomen is flat. Bowel sounds are normal.     Palpations: Abdomen is soft.     Tenderness: There is abdominal tenderness in the left lower quadrant.  Genitourinary:    Comments: No fecal impaction Musculoskeletal:        General: Normal range of motion.     Cervical back: Normal range of motion and neck supple.  Skin:    General: Skin is warm.     Capillary Refill: Capillary refill takes less  than 2 seconds.  Neurological:     General: No focal deficit present.     Mental Status: He is alert. Mental status is at baseline.  Psychiatric:        Mood and Affect: Mood normal.        Behavior: Behavior normal.     ED Results / Procedures / Treatments   Labs (all labs ordered are listed, but only abnormal results are displayed) Labs Reviewed  COMPREHENSIVE METABOLIC PANEL WITH GFR - Abnormal; Notable for the following components:      Result Value   Albumin 3.1 (*)    All other components within normal limits  CBC WITH DIFFERENTIAL/PLATELET - Abnormal; Notable for the following components:   MCV 103.9 (*)    All other components within normal limits  URINALYSIS, ROUTINE W REFLEX MICROSCOPIC - Abnormal; Notable for the following components:   Color, Urine STRAW (*)    Ketones, ur 5 (*)    All other components within normal limits    EKG None  Radiology CT ABDOMEN PELVIS W CONTRAST Result Date: 01/28/2024 CLINICAL DATA:  Acute abdominal pain and constipation. EXAM: CT ABDOMEN AND PELVIS WITH CONTRAST TECHNIQUE: Multidetector CT imaging of the abdomen and pelvis was performed using the standard protocol following bolus administration of intravenous contrast. RADIATION DOSE REDUCTION: This exam was performed according to the departmental dose-optimization program which includes automated exposure control, adjustment of the mA and/or kV according to patient size and/or use of iterative reconstruction technique. CONTRAST:  OMNIPAQUE IOHEXOL 300 MG/ML  SOLN COMPARISON:  Abdominal x-ray same day FINDINGS: Lower chest: No acute abnormality. Hepatobiliary: No focal liver abnormality is seen. No gallstones, gallbladder wall thickening, or biliary dilatation. Pancreas: Unremarkable. No pancreatic ductal dilatation or surrounding inflammatory changes. Spleen: There are numerous subcentimeter hypodensities in the spleen. The spleen is normal in size. Adrenals/Urinary Tract: Kidneys and  adrenal glands are within normal limits. The bladder is distended, but otherwise within normal limits. Stomach/Bowel: The descending colon, sigmoid colon and rectum demonstrates circumferential wall thickening with trace surrounding inflammatory stranding. No dilated bowel loops or pneumatosis. The appendix and small bowel are within normal limits. There is a small hiatal hernia. The stomach is decompressed. Stool burden is average. Vascular/Lymphatic: No significant vascular findings are present. No enlarged abdominal or pelvic lymph nodes. Reproductive: Prostate is unremarkable. Other: No abdominal wall hernia or abnormality. No abdominopelvic ascites. Musculoskeletal: There is scoliosis of the lumbar spine with multilevel degenerative change. IMPRESSION: 1. Findings compatible with mild nonspecific colitis involving the descending colon, sigmoid colon  and rectum. 2. Numerous subcentimeter hypodensities in the spleen, indeterminate. Follow-up MRI may be helpful for further evaluation. 3. Small hiatal hernia. 4. Distended bladder. Electronically Signed   By: Darliss Cheney M.D.   On: 01/28/2024 15:51   DG Abdomen 1 View Result Date: 01/28/2024 CLINICAL DATA:  Constipation. EXAM: ABDOMEN - 1 VIEW COMPARISON:  Radiograph dated 12/08/2013. FINDINGS: No bowel dilatation or evidence of obstruction. No free air or radiopaque calculi. Degenerative changes spine and scoliosis no acute osseous pathology. IMPRESSION: Nonobstructive bowel gas pattern. Electronically Signed   By: Elgie Collard M.D.   On: 01/28/2024 13:35    Procedures Procedures    Medications Ordered in ED Medications  amoxicillin-clavulanate (AUGMENTIN) 875-125 MG per tablet 1 tablet (has no administration in time range)  dicyclomine (BENTYL) capsule 10 mg (has no administration in time range)  sorbitol, magnesium hydroxide, mineral oil, glycerin (SMOG) enema (960 mLs Rectal Given 01/28/24 1225)  sodium chloride 0.9 % bolus 1,000 mL (0 mLs  Intravenous Stopped 01/28/24 1259)  ketorolac (TORADOL) 30 MG/ML injection 30 mg (30 mg Intravenous Given 01/28/24 1329)  iohexol (OMNIPAQUE) 300 MG/ML solution 100 mL (100 mLs Intravenous Contrast Given 01/28/24 1433)    ED Course/ Medical Decision Making/ A&P                                 Medical Decision Making Amount and/or Complexity of Data Reviewed Labs: ordered. Radiology: ordered.  Risk OTC drugs. Prescription drug management.   This patient presents to the ED for concern of abd pain and constipation, this involves an extensive number of treatment options, and is a complaint that carries with it a high risk of complications and morbidity.  The differential diagnosis includes constipation, fecal impaction, sbo, diverticulitis, uti   Co morbidities that complicate the patient evaluation   constipation, hernia, and Down Syndrome   Additional history obtained:  Additional history obtained from epic chart review External records from outside source obtained and reviewed including parents   Lab Tests:  I Ordered, and personally interpreted labs.  The pertinent results include:  cbc nl, cmp nl, ua + ketones, otherwise nl   Imaging Studies ordered:  I ordered imaging studies including kub and ct abd/pelvis  I independently visualized and interpreted imaging which showed  KUB: Nonobstructive bowel gas pattern CT abd/pelvis:  Findings compatible with mild nonspecific colitis involving the  descending colon, sigmoid colon and rectum.  2. Numerous subcentimeter hypodensities in the spleen,  indeterminate. Follow-up MRI may be helpful for further evaluation.  3. Small hiatal hernia.  4. Distended bladder.   . I agree with the radiologist interpretation  Medicines ordered and prescription drug management:  I ordered medication including ivfs/toradol/smog  for sx  Reevaluation of the patient after these medicines showed that the patient improved I have reviewed the  patients home medicines and have made adjustments as needed   Test Considered:  ct  Problem List / ED Course:  Colitis:  pt started on augmentin and bentyl.  Parents told to hold miralax.  Pt has an appt with GI later this week.  Pt has not had a colonoscopy in several years.  Pt is stable for d/c.  Return if worse.   Distended bladder on CT:  pt urinated right after CT, so I think he just had a full bladder after ivfs.   Reevaluation:  After the interventions noted above, I reevaluated the patient and found  that they have :improved   Social Determinants of Health:  Lives at home   Dispostion:  After consideration of the diagnostic results and the patients response to treatment, I feel that the patent would benefit from discharge with outpatient f/u.          Final Clinical Impression(s) / ED Diagnoses Final diagnoses:  Colitis    Rx / DC Orders ED Discharge Orders          Ordered    Bacillus Coagulans-Inulin (ALIGN PREBIOTIC-PROBIOTIC) 5-1.25 MG-GM CHEW  Daily        01/28/24 1608    ondansetron (ZOFRAN-ODT) 4 MG disintegrating tablet  Every 8 hours PRN        01/28/24 1608    amoxicillin-clavulanate (AUGMENTIN) 875-125 MG tablet  Every 12 hours        01/28/24 1608    dicyclomine (BENTYL) 20 MG tablet  2 times daily PRN        01/28/24 1608              Jacalyn Lefevre, MD 01/28/24 1610

## 2024-01-28 NOTE — ED Triage Notes (Signed)
 Patient has been constipated for over 2 weeks. Complaining of lower abdominal pain. No vomiting. Has doubled up on miralax since Friday. Stated there is liquid stool coming out without blood, but no formed bowel movement for 2 weeks.

## 2024-02-04 DIAGNOSIS — R1032 Left lower quadrant pain: Secondary | ICD-10-CM | POA: Diagnosis not present

## 2024-02-04 DIAGNOSIS — K5904 Chronic idiopathic constipation: Secondary | ICD-10-CM | POA: Diagnosis not present

## 2024-02-04 DIAGNOSIS — R1031 Right lower quadrant pain: Secondary | ICD-10-CM | POA: Diagnosis not present

## 2024-02-04 DIAGNOSIS — R933 Abnormal findings on diagnostic imaging of other parts of digestive tract: Secondary | ICD-10-CM | POA: Diagnosis not present

## 2024-02-15 ENCOUNTER — Emergency Department (HOSPITAL_COMMUNITY)

## 2024-02-15 ENCOUNTER — Other Ambulatory Visit: Payer: Self-pay

## 2024-02-15 ENCOUNTER — Encounter (HOSPITAL_COMMUNITY): Payer: Self-pay | Admitting: *Deleted

## 2024-02-15 ENCOUNTER — Emergency Department (HOSPITAL_COMMUNITY)
Admission: EM | Admit: 2024-02-15 | Discharge: 2024-02-15 | Disposition: A | Discharge status: Home / Self Care | Attending: Emergency Medicine | Admitting: Emergency Medicine

## 2024-02-15 DIAGNOSIS — R001 Bradycardia, unspecified: Secondary | ICD-10-CM | POA: Insufficient documentation

## 2024-02-15 DIAGNOSIS — R935 Abnormal findings on diagnostic imaging of other abdominal regions, including retroperitoneum: Secondary | ICD-10-CM | POA: Diagnosis not present

## 2024-02-15 DIAGNOSIS — R109 Unspecified abdominal pain: Secondary | ICD-10-CM | POA: Insufficient documentation

## 2024-02-15 DIAGNOSIS — N3289 Other specified disorders of bladder: Secondary | ICD-10-CM | POA: Diagnosis not present

## 2024-02-15 DIAGNOSIS — R1032 Left lower quadrant pain: Secondary | ICD-10-CM | POA: Diagnosis not present

## 2024-02-15 LAB — CBC WITH DIFFERENTIAL/PLATELET
Abs Immature Granulocytes: 0.01 10*3/uL (ref 0.00–0.07)
Basophils Absolute: 0.1 10*3/uL (ref 0.0–0.1)
Basophils Relative: 2 %
Eosinophils Absolute: 0.1 10*3/uL (ref 0.0–0.5)
Eosinophils Relative: 1 %
HCT: 44.8 % (ref 39.0–52.0)
Hemoglobin: 14.6 g/dL (ref 13.0–17.0)
Immature Granulocytes: 0 %
Lymphocytes Relative: 21 %
Lymphs Abs: 1 10*3/uL (ref 0.7–4.0)
MCH: 33.5 pg (ref 26.0–34.0)
MCHC: 32.6 g/dL (ref 30.0–36.0)
MCV: 102.8 fL — ABNORMAL HIGH (ref 80.0–100.0)
Monocytes Absolute: 0.5 10*3/uL (ref 0.1–1.0)
Monocytes Relative: 12 %
Neutro Abs: 3 10*3/uL (ref 1.7–7.7)
Neutrophils Relative %: 64 %
Platelets: 203 10*3/uL (ref 150–400)
RBC: 4.36 MIL/uL (ref 4.22–5.81)
RDW: 13.9 % (ref 11.5–15.5)
WBC: 4.6 10*3/uL (ref 4.0–10.5)
nRBC: 0 % (ref 0.0–0.2)

## 2024-02-15 LAB — COMPREHENSIVE METABOLIC PANEL WITH GFR
ALT: 19 U/L (ref 0–44)
AST: 26 U/L (ref 15–41)
Albumin: 3 g/dL — ABNORMAL LOW (ref 3.5–5.0)
Alkaline Phosphatase: 65 U/L (ref 38–126)
Anion gap: 8 (ref 5–15)
BUN: 13 mg/dL (ref 6–20)
CO2: 25 mmol/L (ref 22–32)
Calcium: 8.7 mg/dL — ABNORMAL LOW (ref 8.9–10.3)
Chloride: 106 mmol/L (ref 98–111)
Creatinine, Ser: 1.03 mg/dL (ref 0.61–1.24)
GFR, Estimated: >60 mL/min (ref >60)
Glucose, Bld: 80 mg/dL (ref 70–99)
Potassium: 4.4 mmol/L (ref 3.5–5.1)
Sodium: 139 mmol/L (ref 135–145)
Total Bilirubin: 0.4 mg/dL (ref 0.0–1.2)
Total Protein: 6.5 g/dL (ref 6.5–8.1)

## 2024-02-15 LAB — URINALYSIS, ROUTINE W REFLEX MICROSCOPIC
Bilirubin Urine: NEGATIVE
Glucose, UA: NEGATIVE mg/dL
Hgb urine dipstick: NEGATIVE
Ketones, ur: NEGATIVE mg/dL
Leukocytes,Ua: NEGATIVE
Nitrite: NEGATIVE
Protein, ur: NEGATIVE mg/dL
Specific Gravity, Urine: 1.019 (ref 1.005–1.030)
pH: 8 (ref 5.0–8.0)

## 2024-02-15 LAB — LIPASE, BLOOD: Lipase: 34 U/L (ref 11–51)

## 2024-02-15 MED ORDER — LACTATED RINGERS IV BOLUS
1000.0000 mL | Freq: Once | INTRAVENOUS | Status: AC
  Administered 2024-02-15: 1000 mL via INTRAVENOUS

## 2024-02-15 MED ORDER — IOHEXOL 300 MG/ML  SOLN
100.0000 mL | Freq: Once | INTRAMUSCULAR | Status: AC | PRN
  Administered 2024-02-15: 100 mL via INTRAVENOUS

## 2024-02-15 MED ORDER — FENTANYL CITRATE PF 50 MCG/ML IJ SOSY
50.0000 ug | PREFILLED_SYRINGE | Freq: Once | INTRAMUSCULAR | Status: AC
  Administered 2024-02-15: 50 ug via INTRAVENOUS
  Filled 2024-02-15: qty 1

## 2024-02-15 NOTE — Discharge Instructions (Addendum)
 It's unclear what is causing his abdominal pain. Follow up closely with Dr. Rollin.   He also seems to be having trouble fully emptying his bladder.  Follow-up with the [facility].  Follow-up with the cardiology group in regards to the low heart rate.  Return to the ER if he develops dizziness or lightheadedness or passing out.  If he develops new or worsening abdominal pain, inability to urinate, vomiting, fever, or any other new/concerning symptoms then return to the ER.

## 2024-02-15 NOTE — ED Notes (Signed)
Pt has urinal at bedside 

## 2024-02-15 NOTE — ED Triage Notes (Signed)
 Pt continues to have abd pain and constipation. Has not had a normal BM is weeks, small "pinky size stools" but not much, no N/V. Seen for same 3/31 pain increased 10-15 min after eating and when he wakes up with some back pain last couple of days.

## 2024-02-15 NOTE — ED Provider Notes (Signed)
 [place] EMERGENCY DEPARTMENT AT [facility] Provider Note   CSN: [id number] Arrival date & time: 02/15/24  1034     History  Chief Complaint  Patient presents with   Abdominal Pain    Steven [name] is a 58 y.o. male.  HPI 58 year old male with a history of Down syndrome presents with abdominal pain.  Dad provides the history.  Patient was here a few weeks ago with similar symptoms.  At that time he had a CT, was put on antibiotics and Bentyl , and had an enema for constipation.  He continued to have some output for a couple days but then now is having only pinky sized stools.  He is frequently trying to use the bathroom but only has small stools or nothing come out.  He is having intermittent abdominal pain that seems to come on suddenly.  He is also been urinating more often though does not seem to complain of pain with urination.  He has not had a fever or vomiting.  He did go to Dr. Nickey Barn who took him off the antibiotics and bentyl  but there has otherwise been no change in his symptoms.  Home Medications Prior to Admission medications   Medication Sig Start Date End Date Taking? Authorizing Provider  alum & mag hydroxide-simeth (MAALOX MAX) 400-400-40 MG/5ML suspension Take 5 mLs by mouth every 6 (six) hours as needed for indigestion. 07/14/23   Adolph Hoop, PA-C  amoxicillin -clavulanate (AUGMENTIN ) 875-125 MG tablet Take 1 tablet by mouth every 12 (twelve) hours. 01/28/24   Haviland, Julie, MD  Bacillus Coagulans-Inulin (ALIGN PREBIOTIC-PROBIOTIC) 5-1.25 MG-GM CHEW Chew 1 capsule by mouth daily. 01/28/24   Haviland, Julie, MD  Cholecalciferol (VITAMIN D  PO) Take 5,000 Units by mouth daily.     [provider]  dicyclomine  (BENTYL ) 20 MG tablet Take 1 tablet (20 mg total) by mouth 2 (two) times daily as needed for spasms. 01/28/24   Haviland, Julie, MD  famotidine  (PEPCID ) 20 MG tablet Take 1 tablet (20 mg total) by mouth 2 times daily. 10/17/23   Benjiman Bras, MD   fluticasone  (FLONASE ) 50 MCG/ACT nasal spray Place 1 spray into both nostrils daily. 09/10/23   Benjiman Bras, MD  Multiple Vitamin (MULTIVITAMIN WITH MINERALS) TABS tablet Take 1 tablet by mouth daily.    [provider]  Omega-3 Fatty Acids (OMEGA-3 CF PO) Take 1,600 mg by mouth daily.    [provider]  omeprazole  (PRILOSEC) 20 MG capsule Take 1 capsule (20 mg total) by mouth daily. 08/27/23   Benjiman Bras, MD  ondansetron  (ZOFRAN -ODT) 4 MG disintegrating tablet Take 1 tablet (4 mg total) by mouth every 8 (eight) hours as needed. 01/28/24   Haviland, Julie, MD  triamcinolone  cream (KENALOG ) 0.1 % Apply 1 Application topically 2 (two) times daily. 04/20/23   Benjiman Bras, MD      Allergies    Patient has no known allergies.    Review of Systems   Review of Systems  Unable to perform ROS: Other    Physical Exam Updated Vital Signs BP 103/72   Pulse (!) 36   Temp 97.7 F (36.5 C) (Oral)   Resp 10   Ht 5' (1.524 m)   Wt 75.8 kg   SpO2 100%   BMI 32.61 kg/m  Physical Exam Vitals and nursing note reviewed.  Constitutional:      Appearance: He is well-developed.  HENT:     Head: Normocephalic and atraumatic.  Cardiovascular:  Rate and Rhythm: Regular rhythm. Bradycardia present.     Heart sounds: Normal heart sounds.  Pulmonary:     Effort: Pulmonary effort is normal.     Breath sounds: Normal breath sounds.  Abdominal:     General: There is no distension.     Palpations: Abdomen is soft.     Tenderness: There is abdominal tenderness.  Skin:    General: Skin is warm and dry.  Neurological:     Mental Status: He is alert.     ED Results / Procedures / Treatments   Labs (all labs ordered are listed, but only abnormal results are displayed) Labs Reviewed  COMPREHENSIVE METABOLIC PANEL WITH GFR - Abnormal; Notable for the following components:      Result Value   Calcium  8.7 (*)    Albumin 3.0 (*)    All other components within  normal limits  URINALYSIS, ROUTINE W REFLEX MICROSCOPIC - Abnormal; Notable for the following components:   Color, Urine COLORLESS (*)    All other components within normal limits  CBC WITH DIFFERENTIAL/PLATELET - Abnormal; Notable for the following components:   MCV 102.8 (*)    All other components within normal limits  LIPASE, BLOOD    EKG EKG Interpretation Date/Time:  Friday February 15 2024 13:29:35 EDT Ventricular Rate:  35 PR Interval:    QRS Duration:  80 QT Interval:  523 QTC Calculation: 399 R Axis:   -9  Text Interpretation: Junctional rhythm Low voltage, extremity and precordial leads Confirmed by Jerilynn Montenegro [phone]) on 02/15/2024 1:38:15 PM  Radiology CT ABDOMEN PELVIS W CONTRAST Result Date: 02/15/2024 CLINICAL DATA:  Abdominal pain, acute, nonlocalized EXAM: CT ABDOMEN AND PELVIS WITH CONTRAST TECHNIQUE: Multidetector CT imaging of the abdomen and pelvis was performed using the standard protocol following bolus administration of intravenous contrast. RADIATION DOSE REDUCTION: This exam was performed according to the departmental dose-optimization program which includes automated exposure control, adjustment of the mA and/or kV according to patient size and/or use of iterative reconstruction technique. CONTRAST:  OMNIPAQUE  IOHEXOL  300 MG/ML  SOLN COMPARISON:  January 28, 2024 FINDINGS: Lower chest: Mild cardiomegaly. Posterior bibasilar dependent atelectasis. Hepatobiliary: No mass.No radiopaque stones or wall thickening of the gallbladder.No intrahepatic or extrahepatic biliary ductal dilation. Pancreas: No mass or main ductal dilation.No peripancreatic inflammation or fluid collection. Spleen: Similar appearance of multiple subcentimeter hypodensities, which may reflect multiple cysts or hemangiomas. No mass. Adrenals/Urinary Tract: No adrenal masses. No renal mass. No nephrolithiasis or hydronephrosis. The urinary bladder is distended without focal abnormality.  Stomach/Bowel: The stomach is decompressed without focal abnormality. No small bowel wall thickening or inflammation. No small bowel obstruction. The appendix was not visualized. No right lower quadrant or pericecal inflammatory changes to suggest acute appendicitis. Vascular/Lymphatic: No aortic aneurysm. No intraabdominal or pelvic lymphadenopathy. Reproductive: No prostatomegaly.No free pelvic fluid. Other: No pneumoperitoneum, ascites, or mesenteric inflammation. Musculoskeletal: No acute fracture or destructive lesion.Small volume symmetric bilateral gynecomastia. Multilevel degenerative disc disease of the spine. Mild grade 1 anterolisthesis of L4 on L5. IMPRESSION: No acute intra-abdominal or pelvic abnormality. Electronically Signed   By: Rance Burrows M.D.   On: 02/15/2024 13:42    Procedures Procedures    Medications Ordered in ED Medications  lactated ringers  bolus 1,000 mL (0 mLs Intravenous Stopped 02/15/24 1300)  iohexol  (OMNIPAQUE ) 300 MG/ML solution 100 mL (100 mLs Intravenous Contrast Given 02/15/24 1210)  fentaNYL  (SUBLIMAZE ) injection 50 mcg (50 mcg Intravenous Given 02/15/24 1239)    ED Course/ Medical Decision  Making/ A&P                                 Medical Decision Making Amount and/or Complexity of Data Reviewed Independent Historian: parent External Data Reviewed: notes. Labs: ordered.    Details: Normal WBC Radiology: ordered.    Details: No obstruction ECG/medicine tests: ordered and independent interpretation performed.    Details: Junctional rhythm  Risk Prescription drug management.   Patient presents with recurrent and continued abdominal pain.  No clear cause at this time.  I question whether he has had some issues with completely emptying his bladder as he has been urinating more often.  The urine does not appear consistent with a UTI.  He was able to urinate here but frequently had around 200 cc of urine left in the bladder afterwards.  Talked  frequently with parents, this does not seem consistent with retention though he may need to follow-up with urology for these bladder emptying issues.  Could be the cause of his symptoms versus some other cause such as spasms.  His doctor took him off the Bentyl  as this can cause constipation.  He has been having small stools but there is no significant amount of stool that would be concerning on the CT.  I did discuss his bradycardia with cardiology as he does have heart rate frequently in the 30s here.  He does not appear acutely symptomatic.  Patiently he gets dizzy when standing but no syncope.  Discussed with Dr. Lavonne Prairie has reviewed case and ECG looks the same as back in January to him.  Recommends outpatient cardiology follow-up which he will help set up.  Otherwise, patient appears stable for discharge.  Will give urology and gastroenterology follow-up recommendations as well as cardiology.  Given return precautions.       Final Clinical Impression(s) / ED Diagnoses Final diagnoses:  Abdominal pain, unspecified abdominal location  Bradycardia    Rx / DC Orders ED Discharge Orders          Ordered    Ambulatory referral to Cardiology       Comments: If you have not heard from the Cardiology office within the next 72 hours please call [phone].   02/15/24 1507              Jerilynn Montenegro, MD 02/15/24 1539

## 2024-02-18 ENCOUNTER — Ambulatory Visit: Admitting: Student in an Organized Health Care Education/Training Program

## 2024-02-20 ENCOUNTER — Encounter: Payer: Self-pay | Admitting: Internal Medicine

## 2024-02-20 ENCOUNTER — Ambulatory Visit

## 2024-02-20 ENCOUNTER — Ambulatory Visit: Attending: Internal Medicine | Admitting: Internal Medicine

## 2024-02-20 VITALS — BP 118/62 | HR 60 | Ht 60.0 in | Wt 157.0 lb

## 2024-02-20 DIAGNOSIS — I495 Sick sinus syndrome: Secondary | ICD-10-CM

## 2024-02-20 DIAGNOSIS — R001 Bradycardia, unspecified: Secondary | ICD-10-CM

## 2024-02-20 NOTE — Patient Instructions (Addendum)
 Medication Instructions:                                                                                                                            Your physician recommends that you continue on your current medications as directed. Please refer to the Current Medication list given to you today.  *If you need a refill on your cardiac medications before your next appointment, please call your pharmacy*  Lab Work: None ordered.  If you have labs (blood work) drawn today and your tests are completely normal, you will receive your results only by: MyChart Message (if you have MyChart) OR A paper copy in the mail If you have any lab test that is abnormal or we need to change your treatment, we will call you to review the results.  Testing/Procedures: Steven [name]- Long Term Monitor Instructions  Your physician has requested you wear a ZIO patch monitor for 7 days.  This is a single patch monitor. [employer] supplies one patch monitor per enrollment. Additional stickers are not available. Please do not apply patch if you will be having a Nuclear Stress Test,  Echocardiogram, Cardiac CT, MRI, or Chest Xray during the period you would be wearing the  monitor. The patch cannot be worn during these tests. You cannot remove and re-apply the  ZIO XT patch monitor.  Your ZIO patch monitor will be mailed 3 day USPS to your address on file. It may take 3-5 days  to receive your monitor after you have been enrolled.  Once you have received your monitor, please review the enclosed instructions. Your monitor  has already been registered assigning a specific monitor serial # to you.  Billing and Patient Assistance Program Information  We have supplied [employer] with any of your insurance information on file for billing purposes. [employer] offers a sliding scale Patient Assistance Program for patients that do not have  insurance, or whose insurance does not completely cover the cost of the ZIO monitor.  You must  apply for the Patient Assistance Program to qualify for this discounted rate.  To apply, please call [employer] at [phone], select option 4, select option 2, ask to apply for  Patient Assistance Program. Steven [name] will ask your household income, and how many people  are in your household. They will quote your out-of-pocket cost based on that information.  [employer] will also be able to set up a 61-month, interest-free payment plan if needed.  Applying the monitor   Shave hair from upper left chest.  Hold abrader disc by orange tab. Rub abrader in 40 strokes over the upper left chest as  indicated in your monitor instructions.  Clean area with 4 enclosed alcohol pads. Let dry.  Apply patch as indicated in monitor instructions. Patch will be placed under collarbone on left  side of chest with arrow pointing upward.  Rub patch adhesive wings for 2 minutes. Remove white label marked "1". Remove the white  label marked "  2". Rub patch adhesive wings for 2 additional minutes.  While looking in a mirror, press and release button in center of patch. A small green light will  flash 3-4 times. This will be your only indicator that the monitor has been turned on.  Do not shower for the first 24 hours. You may shower after the first 24 hours.  Press the button if you feel a symptom. You will hear a small click. Record Date, Time and  Symptom in the Patient Logbook.  When you are ready to remove the patch, follow instructions on the last 2 pages of Patient  Logbook. Stick patch monitor onto the last page of Patient Logbook.  Place Patient Logbook in the blue and white box. Use locking tab on box and tape box closed  securely. The blue and white box has prepaid postage on it. Please place it in the mailbox as  soon as possible. Your physician should have your test results approximately 7 days after the  monitor has been mailed back to [facility].  Call [facility] Customer Care at [phone] if  you have questions regarding  your ZIO XT patch monitor. Call them immediately if you see an orange light blinking on your  monitor.  If your monitor falls off in less than 4 days, contact our Monitor department at [phone].  If your monitor becomes loose or falls off after 4 days call [employer] at [phone] for  suggestions on securing your monitor   Follow-Up: At [facility], you and your health needs are our priority.  As part of our continuing mission to provide you with exceptional heart care, our providers are all part of one team.  This team includes your primary Cardiologist (physician) and Advanced Practice Providers or APPs (Physician Assistants and Nurse Practitioners) who all work together to provide you with the care you need, when you need it.  Your next appointment:   Dr Clinton Danas - 1 month - I will have his scheduler contact you to set up this appointment      1st Floor: - Lobby - Registration  - Pharmacy  - Lab - Cafe  2nd Floor: - PV Lab - Diagnostic Testing (echo, CT, nuclear med)  3rd Floor: - Vacant  4th Floor: - TCTS (cardiothoracic surgery) - AFib Clinic - Structural Heart Clinic - Vascular Surgery  - Vascular Ultrasound  5th Floor: - HeartCare Cardiology (general and EP) - Clinical Pharmacy for coumadin, hypertension, lipid, weight-loss medications, and med management appointments    Valet parking services will be available as well.

## 2024-02-20 NOTE — Progress Notes (Unsigned)
 Enrolled for [employer] to mail a ZIO XT long term holter monitor to the patients address on file.   Dr. Daneil Dunker to read.

## 2024-02-20 NOTE — Progress Notes (Signed)
 Patient Care Team: Benjiman Bras, MD as PCP - General (Family Medicine)   HPI  Steven [name] is a 58 y.o. male seen previously for sleep apnea with nocturnal bradycardia in the context of  Down syndrome.  He  is referred back following evaluation 1/24 where he was noted to have sinus node dysfunction, a literature review review demonstrated sinus node dysfunction in patients with trisomy 21 presenting with syncope and needing pacing.  It was decided to defer pacing to his treatment of his sleep apnea and then follow-up never recurred.  He was in the emergency room for 4 of his many visits because of GI discomfort and was noted and has been noted repeatedly since January 2024 to have sinus node dysfunction with junctional rhythm occasionally with a couplet related to retrograde conduction to the AA with then subsequent antegrade conduction.  He has exercise intolerance with shortness of breath at sometimes 20 feet or less characterized by fatigue.  He also has orthostatic lightheadedness.  No syncope  DATE TEST EF    12/23 Echo   60-65 %               Date Cr K Hgb  12/23 1.27 4.3 15.0            Records and Results Reviewed   Past Medical History:  Diagnosis Date   Complication of anesthesia    per patient father, patient was slow to wake with a previous surgery    Down syndrome    Incisional hernia    MR (mental retardation)     Past Surgical History:  Procedure Laterality Date   BIOPSY  12/07/2023   Procedure: BIOPSY;  Surgeon: Alvis Jourdain, MD;  Location: [facility];  Service: Gastroenterology;;   ESOPHAGOGASTRODUODENOSCOPY (EGD) WITH PROPOFOL  N/A 12/07/2023   Procedure: ESOPHAGOGASTRODUODENOSCOPY (EGD) WITH PROPOFOL ;  Surgeon: Alvis Jourdain, MD;  Location: [facility];  Service: Gastroenterology;  Laterality: N/A;   HERNIA REPAIR     unaware of the year of , maybe 10 years ago    INCISIONAL HERNIA REPAIR N/A 07/17/2017   Procedure: LAPAROSCOPIC  INCISIONAL HERNIA WITH MESH;  Surgeon: Oza Blumenthal, MD;  Location: [facility];  Service: General;  Laterality: N/A;   INSERTION OF MESH N/A 07/17/2017   Procedure: INSERTION OF MESH;  Surgeon: Oza Blumenthal, MD;  Location: [facility];  Service: General;  Laterality: N/A;    No outpatient medications have been marked as taking for the 02/20/24 encounter (Office Visit) with Verona Goodwill, MD.    No Known Allergies    Review of Systems negative except from HPI and PMH  Physical Exam BP 118/62   Pulse 60   Ht 5' (1.524 m)   Wt 157 lb (71.2 kg)   SpO2 98%   BMI 30.66 kg/m  Well developed and well nourished in no acute distress  HENT normal Down's facies E scleral and icterus clear Neck Supple JVP flat; carotids brisk and full Clear to ausculation Regular rate and rhythm, no murmurs gallops or rub Soft with active bowel sounds No  Edema Alert and oriented, grossly normal motor and sensory function Skin Warm and Dry  ECG junctional rhythm With temporally associated P waves some of which seem to conduct antegrade and others of which do not and it appears to be possibly related to the junctional R-A interval Estimated Creatinine Clearance: 63.9 mL/min (by C-G formula based on SCr of 1.03 mg/dL).   Assessment and  Plan Sinus bradycardia  Severe sleep apnea   Down syndrome    Sinus node dysfunction seemingly progressive with junctional rhythm bradycardia and progressive exercise intolerance.  Lightheadedness may be related to this may also be orthostatic.  We have discussed using an event recorder to look for chronotropic competence and then if this is not evident, to proceed with pacing.  It may well be that he would need anesthesia to be able to be compliant.  As I am going on medical leave, I will forward this note to Dr. Clinton Danas and ask him also to review the event recorder.  Will arrange for follow-up with the patient thereafter and they can come to a mutual decision  regarding pacing    Current medicines are reviewed at length with the patient today .  The patient does not have concerns regarding medicines.

## 2024-02-26 DIAGNOSIS — R3916 Straining to void: Secondary | ICD-10-CM | POA: Diagnosis not present

## 2024-02-26 DIAGNOSIS — R3912 Poor urinary stream: Secondary | ICD-10-CM | POA: Diagnosis not present

## 2024-02-26 DIAGNOSIS — R350 Frequency of micturition: Secondary | ICD-10-CM | POA: Diagnosis not present

## 2024-02-27 ENCOUNTER — Encounter (HOSPITAL_COMMUNITY): Payer: Self-pay

## 2024-02-27 ENCOUNTER — Emergency Department (HOSPITAL_COMMUNITY)
Admission: EM | Admit: 2024-02-27 | Discharge: 2024-02-27 | Disposition: A | Discharge status: Home / Self Care | Attending: Emergency Medicine | Admitting: Emergency Medicine

## 2024-02-27 ENCOUNTER — Other Ambulatory Visit: Payer: Self-pay

## 2024-02-27 ENCOUNTER — Emergency Department (HOSPITAL_COMMUNITY)

## 2024-02-27 DIAGNOSIS — R932 Abnormal findings on diagnostic imaging of liver and biliary tract: Secondary | ICD-10-CM | POA: Diagnosis not present

## 2024-02-27 DIAGNOSIS — K5900 Constipation, unspecified: Secondary | ICD-10-CM | POA: Insufficient documentation

## 2024-02-27 DIAGNOSIS — K760 Fatty (change of) liver, not elsewhere classified: Secondary | ICD-10-CM | POA: Diagnosis not present

## 2024-02-27 DIAGNOSIS — R001 Bradycardia, unspecified: Secondary | ICD-10-CM | POA: Diagnosis not present

## 2024-02-27 DIAGNOSIS — R109 Unspecified abdominal pain: Secondary | ICD-10-CM | POA: Diagnosis not present

## 2024-02-27 LAB — CBC WITH DIFFERENTIAL/PLATELET
Abs Immature Granulocytes: 0 10*3/uL (ref 0.00–0.07)
Basophils Absolute: 0.1 10*3/uL (ref 0.0–0.1)
Basophils Relative: 2 %
Eosinophils Absolute: 0.1 10*3/uL (ref 0.0–0.5)
Eosinophils Relative: 1 %
HCT: 45.3 % (ref 39.0–52.0)
Hemoglobin: 14.8 g/dL (ref 13.0–17.0)
Immature Granulocytes: 0 %
Lymphocytes Relative: 20 %
Lymphs Abs: 0.9 10*3/uL (ref 0.7–4.0)
MCH: 33.6 pg (ref 26.0–34.0)
MCHC: 32.7 g/dL (ref 30.0–36.0)
MCV: 102.7 fL — ABNORMAL HIGH (ref 80.0–100.0)
Monocytes Absolute: 0.4 10*3/uL (ref 0.1–1.0)
Monocytes Relative: 10 %
Neutro Abs: 3 10*3/uL (ref 1.7–7.7)
Neutrophils Relative %: 67 %
Platelets: 193 10*3/uL (ref 150–400)
RBC: 4.41 MIL/uL (ref 4.22–5.81)
RDW: 14 % (ref 11.5–15.5)
WBC: 4.4 10*3/uL (ref 4.0–10.5)
nRBC: 0 % (ref 0.0–0.2)

## 2024-02-27 LAB — LIPASE, BLOOD: Lipase: 32 U/L (ref 11–51)

## 2024-02-27 LAB — URINALYSIS, ROUTINE W REFLEX MICROSCOPIC
Bilirubin Urine: NEGATIVE
Glucose, UA: NEGATIVE mg/dL
Hgb urine dipstick: NEGATIVE
Ketones, ur: NEGATIVE mg/dL
Leukocytes,Ua: NEGATIVE
Nitrite: NEGATIVE
Protein, ur: NEGATIVE mg/dL
Specific Gravity, Urine: >1.046 — ABNORMAL HIGH (ref 1.005–1.030)
pH: 6 (ref 5.0–8.0)

## 2024-02-27 LAB — COMPREHENSIVE METABOLIC PANEL WITH GFR
ALT: 12 U/L (ref 0–44)
AST: 26 U/L (ref 15–41)
Albumin: 3.1 g/dL — ABNORMAL LOW (ref 3.5–5.0)
Alkaline Phosphatase: 66 U/L (ref 38–126)
Anion gap: 4 — ABNORMAL LOW (ref 5–15)
BUN: 12 mg/dL (ref 6–20)
CO2: 30 mmol/L (ref 22–32)
Calcium: 8.8 mg/dL — ABNORMAL LOW (ref 8.9–10.3)
Chloride: 106 mmol/L (ref 98–111)
Creatinine, Ser: 1.28 mg/dL — ABNORMAL HIGH (ref 0.61–1.24)
GFR, Estimated: >60 mL/min (ref >60)
Glucose, Bld: 87 mg/dL (ref 70–99)
Potassium: 4.4 mmol/L (ref 3.5–5.1)
Sodium: 140 mmol/L (ref 135–145)
Total Bilirubin: 0.7 mg/dL (ref 0.0–1.2)
Total Protein: 6.5 g/dL (ref 6.5–8.1)

## 2024-02-27 MED ORDER — ONDANSETRON 8 MG PO TBDP
8.0000 mg | ORAL_TABLET | Freq: Once | ORAL | Status: AC
  Administered 2024-02-27: 8 mg via ORAL
  Filled 2024-02-27: qty 1

## 2024-02-27 MED ORDER — ACETAMINOPHEN 500 MG PO TABS
1000.0000 mg | ORAL_TABLET | Freq: Once | ORAL | Status: AC
  Administered 2024-02-27: 1000 mg via ORAL
  Filled 2024-02-27: qty 2

## 2024-02-27 MED ORDER — IOHEXOL 300 MG/ML  SOLN
100.0000 mL | Freq: Once | INTRAMUSCULAR | Status: AC | PRN
  Administered 2024-02-27: 80 mL via INTRAVENOUS

## 2024-02-27 MED ORDER — FLEET ENEMA RE ENEM: 1.0000 | ENEMA | Freq: Once | RECTAL | 0 refills | Status: AC

## 2024-02-27 NOTE — Discharge Instructions (Addendum)
 Please use Fleet enema to help with constipation.  Call and follow-up closely with GI specialist for outpatient evaluation of your symptoms.  You may benefit from a colonoscopy.

## 2024-02-27 NOTE — ED Provider Notes (Signed)
 [name] EMERGENCY DEPARTMENT AT [facility] Provider Note   CSN: [id number] Arrival date & time: 02/27/24  1321     History  Chief Complaint  Patient presents with   Abdominal Pain    Pt arrives via parents with complaints of subumbillical abdominal pain and small bowel movements over the last three weeks. Father endorses the stool is very small and appears hard. Pt strains, and yells in pain, has been on miralax  for 1 week, with no further production. Pt was here twice for this recently, given enema, bloodwork, and CT last week. Is on a heart monitor for bradycardia, rate here approx 38BPM    Steven [name] is a 58 y.o. male.  The history is provided by the patient, a parent and medical records. No language interpreter was used.  Abdominal Pain    58 year old pleasant male seen today for periumbilical pain X 3 weeks. The patient has down syndrome with a history of constipation and bradycardia. The father provides the history. The patient was here a week ago with the same complaint and no improvement since. The patient was told he had colitis and was given an antibiotic to take 2 weeks ago and was told at his last visit that the inflammation has decreased with no other concerning signs on Abdominal CT scan. The patient has periumbilical pain when he has the urge to have a bowel movement about 15 minutes after eating or lying down for a period of time. He has the urge for a bowel movement 5-6 times daily, but only passes hard piece of small stool once a day. Patient has been taking miraLAX  for 2 weeks with no improvement and has normal appetitive and water intake. His last bowel movement was last night. Denies N/V/D, dysuria, SOB, or CP.   Home Medications Prior to Admission medications   Medication Sig Start Date End Date Taking? Authorizing Provider  alum & mag hydroxide-simeth (MAALOX MAX) 400-400-40 MG/5ML suspension Take 5 mLs by mouth every 6 (six) hours as needed for  indigestion. Patient not taking: Reported on 02/20/2024 07/14/23   Adolph Hoop, PA-C  amoxicillin -clavulanate (AUGMENTIN ) 875-125 MG tablet Take 1 tablet by mouth every 12 (twelve) hours. Patient not taking: Reported on 02/20/2024 01/28/24   Sueellen Emery, MD  Bacillus Coagulans-Inulin (ALIGN PREBIOTIC-PROBIOTIC) 5-1.25 MG-GM CHEW Chew 1 capsule by mouth daily. Patient not taking: Reported on 02/20/2024 01/28/24   Haviland, Julie, MD  Cholecalciferol (VITAMIN D  PO) Take 5,000 Units by mouth daily.  Patient not taking: Reported on 02/20/2024    [provider]  dicyclomine  (BENTYL ) 20 MG tablet Take 1 tablet (20 mg total) by mouth 2 (two) times daily as needed for spasms. Patient not taking: Reported on 02/20/2024 01/28/24   Sueellen Emery, MD  famotidine  (PEPCID ) 20 MG tablet Take 1 tablet (20 mg total) by mouth 2 times daily. Patient not taking: Reported on 02/20/2024 10/17/23   Benjiman Bras, MD  fluticasone  (FLONASE ) 50 MCG/ACT nasal spray Place 1 spray into both nostrils daily. Patient not taking: Reported on 02/20/2024 09/10/23   Benjiman Bras, MD  Multiple Vitamin (MULTIVITAMIN WITH MINERALS) TABS tablet Take 1 tablet by mouth daily. Patient not taking: Reported on 02/20/2024    [provider]  Omega-3 Fatty Acids (OMEGA-3 CF PO) Take 1,600 mg by mouth daily. Patient not taking: Reported on 02/20/2024    [provider]  omeprazole  (PRILOSEC) 20 MG capsule Take 1 capsule (20 mg total) by mouth daily. Patient not  taking: Reported on 02/20/2024 08/27/23   Benjiman Bras, MD  ondansetron  (ZOFRAN -ODT) 4 MG disintegrating tablet Take 1 tablet (4 mg total) by mouth every 8 (eight) hours as needed. Patient not taking: Reported on 02/20/2024 01/28/24   Sueellen Emery, MD  triamcinolone  cream (KENALOG ) 0.1 % Apply 1 Application topically 2 (two) times daily. Patient not taking: Reported on 02/20/2024 04/20/23   Benjiman Bras, MD      Allergies    Patient has no  known allergies.    Review of Systems   Review of Systems  Gastrointestinal:  Positive for abdominal pain.  All other systems reviewed and are negative.   Physical Exam Updated Vital Signs BP 93/70   Pulse (!) 35   Temp 97.6 F (36.4 C) (Oral)   Resp 17   SpO2 100%  Physical Exam Constitutional:      General: He is not in acute distress.    Appearance: He is well-developed.  HENT:     Head: Atraumatic.  Eyes:     Conjunctiva/sclera: Conjunctivae normal.  Cardiovascular:     Rate and Rhythm: Bradycardia present.  Pulmonary:     Effort: Pulmonary effort is normal.     Breath sounds: Normal breath sounds.  Abdominal:     General: Bowel sounds are normal.     Palpations: Abdomen is soft.     Tenderness: There is no abdominal tenderness.     Hernia: No hernia is present.  Genitourinary:    Comments: Chaperone present during exam.  Normal rectal tone, no obvious mass, no stool impaction, normal color stool on glove, no thrombosed hemorrhoid Musculoskeletal:     Cervical back: Normal range of motion and neck supple.  Skin:    Findings: No rash.  Neurological:     Mental Status: He is alert.     ED Results / Procedures / Treatments   Labs (all labs ordered are listed, but only abnormal results are displayed) Labs Reviewed  CBC WITH DIFFERENTIAL/PLATELET - Abnormal; Notable for the following components:      Result Value   MCV 102.7 (*)    All other components within normal limits  COMPREHENSIVE METABOLIC PANEL WITH GFR - Abnormal; Notable for the following components:   Creatinine, Ser 1.28 (*)    Calcium  8.8 (*)    Albumin 3.1 (*)    Anion gap 4 (*)    All other components within normal limits  LIPASE, BLOOD  URINALYSIS, ROUTINE W REFLEX MICROSCOPIC    EKG EKG Interpretation Date/Time:  Wednesday February 27 2024 13:45:19 EDT Ventricular Rate:  62 PR Interval:    QRS Duration:  80 QT Interval:  428 QTC Calculation: 354 R Axis:   53  Text  Interpretation: Atrial fibrillation Low voltage, precordial leads Consider anterior infarct Confirmed by Jerald Molly [phone]) on 02/27/2024 3:04:11 PM  Radiology CT ABDOMEN PELVIS W CONTRAST Result Date: 02/27/2024 CLINICAL DATA:  Abdominal pain, acute, nonlocalized. EXAM: CT ABDOMEN AND PELVIS WITH CONTRAST TECHNIQUE: Multidetector CT imaging of the abdomen and pelvis was performed using the standard protocol following bolus administration of intravenous contrast. RADIATION DOSE REDUCTION: This exam was performed according to the departmental dose-optimization program which includes automated exposure control, adjustment of the mA and/or kV according to patient size and/or use of iterative reconstruction technique. CONTRAST:  80mL OMNIPAQUE  IOHEXOL  300 MG/ML  SOLN COMPARISON:  02/15/2024 FINDINGS: Lower chest: Lung bases are clear.  No pleural effusions. Hepatobiliary: Decreased attenuation of the liver is suggestive for steatosis. Enhancement  of gallbladder wall without gallbladder distension. Gallbladder wall enhancement may be related to the phase of contrast. Main portal venous system is patent. No significant biliary dilatation. 8 mm hypodensity in the inferior right hepatic lobe on image 20/12 is likely an incidental finding. Pancreas: Unremarkable. No pancreatic ductal dilatation or surrounding inflammatory changes. Spleen: Tiny hypodensities in the spleen are similar to the previous examination and nonspecific. No splenomegaly. Adrenals/Urinary Tract: Adrenal glands are within normal limits. Normal appearance of both kidneys without hydronephrosis. No suspicious renal lesions. Urinary bladder is decompressed and difficult to evaluate for wall thickening. Stomach/Bowel: Stomach is within normal limits. No bowel dilatation. No focal bowel inflammation. Vascular/Lymphatic: No significant vascular findings are present. No enlarged abdominal or pelvic lymph nodes. Reproductive: Prostate is unremarkable.  Other: Negative for free fluid. Negative for free air. Postsurgical changes along the anterior abdominal wall compatible with a ventral hernia repair. Musculoskeletal: No acute bone abnormality. Focal sclerosis in right pubic bone is similar to the prior examination and likely an incidental finding. Levoscoliosis in lumbar spine. Multilevel degenerative changes in lumbar spine. IMPRESSION: 1. No acute abnormality in the abdomen or pelvis. 2. Hepatic steatosis. 3. Prominent enhancement of the gallbladder wall is nonspecific and may be related to the phase of contrast. No gallbladder distension. If there is clinical concern for acute cholecystitis, consider further evaluation with right upper quadrant ultrasound. 4. Tiny hypodensities in the spleen are nonspecific but likely incidental findings. Electronically Signed   By: Elene Griffes M.D.   On: 02/27/2024 16:25    Procedures Procedures    Medications Ordered in ED Medications  ondansetron  (ZOFRAN -ODT) disintegrating tablet 8 mg (8 mg Oral Given 02/27/24 1419)  acetaminophen  (TYLENOL ) tablet 1,000 mg (1,000 mg Oral Given 02/27/24 1418)  iohexol  (OMNIPAQUE ) 300 MG/ML solution 100 mL (80 mLs Intravenous Contrast Given 02/27/24 1550)    ED Course/ Medical Decision Making/ A&P                                 Medical Decision Making Amount and/or Complexity of Data Reviewed Labs: ordered.   BP 111/72   Pulse (!) 36   Temp (!) 97.4 F (36.3 C) (Oral)   Resp 18   SpO2 100%   56:33 PM  58 year old pleasant male seen today for periumbilical pain X 3 weeks. The patient has down syndrome with a history of constipation and bradycardia. The father provides the history. The patient was here a week ago with the same complaint and no improvement since. The patient was told he had colitis and was given an antibiotic to take 2 weeks ago and was told at his last visit that the inflammation has decreased with no other concerning signs on Abdominal CT scan. The  patient has periumbilical pain when he has the urge to have a bowel movement about 15 minutes after eating or lying down for a period of time. He has the urge for a bowel movement 5-6 times daily, but only passes hard piece of small stool once a day. Patient has been taking miraLAX  for 2 weeks with no improvement and has normal appetitive and water intake. His last bowel movement was last night. Denies N/V/D, dysuria, SOB, or CP.   On exam, patient is laying comfortably in bed appears to be in no acute discomfort.  Exam notable for bradycardia, abdomen nontender with bowel sounds present.  Rectal exam unremarkable with no evidence of stool  impaction.  -Labs ordered, independently viewed and interpreted by me.  Labs remarkable for cr 1.28.  -The patient was maintained on a cardiac monitor.  I personally viewed and interpreted the cardiac monitored which showed an underlying rhythm of: sinus bradycardia -Imaging independently viewed and interpreted by me and I agree with radiologist's interpretation.  Result remarkable for abd/pelvis CT showing no acute finding.  Although gallbladder is mildly distended, no inflammatory changes noted.  Pt's sxs not consistent with gallbladder etiology.  Father report pt recently had HIDA scan and it was normal -This patient presents to the ED for concern of constipation, this involves an extensive number of treatment options, and is a complaint that carries with it a high risk of complications and morbidity.  The differential diagnosis includes IBS, IBD, functional constipation, SBO, colitis, diverticulitis -Co morbidities that complicate the patient evaluation includes MR, incisional hernia, tachybrady syndrome -Treatment includes reassurance -Reevaluation of the patient after these medicines showed that the patient improved -PCP office notes or outside notes reviewed -Escalation to admission/observation considered: patients feels much better, is comfortable with  discharge, and will follow up with GI -Prescription medication considered, patient comfortable with OTC fleet enema -Social Determinant of Health considered   Patient is found to be bradycardic.  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  Cardiology is well aware and is currently evaluating outpatient.  He is asymptomatic from this standpoint       Final Clinical Impression(s) / ED Diagnoses Final diagnoses:  Constipation, unspecified constipation type    Rx / DC Orders ED Discharge Orders          Ordered    sodium phosphate  (FLEET) ENEM   Once        02/27/24 2039              Debbra Fairy, PA-C 02/27/24 2040    Lind Repine, MD 03/02/24 [phone]

## 2024-02-27 NOTE — ED Notes (Signed)
 Per pt parents, his HR has been low recently, has a sinus node problem and is currently wearing a heart monitor from his PCP

## 2024-02-27 NOTE — ED Provider Triage Note (Signed)
 Emergency Medicine Provider Triage Evaluation Note  Steven [name] , a 58 y.o. male  was evaluated in triage.  Pt complains of abdominal pain.  Started about 3 weeks ago.  Pain is umbilical.  Also having very small and hard bowel movements.  Denies urinary symptoms and fever.  Denies nausea or vomiting.  Denies chest pain shortness of breath or palpitations denies history of arrhythmia.  Review of Systems  Positive: See above Negative: See above  Physical Exam  BP 112/67 (BP Location: Right Arm)   Pulse (!) 38   Temp 98.6 F (37 C) (Oral)   Resp 16   SpO2 100%  Gen:   Awake, no distress   Resp:  Normal effort  MSK:   Moves extremities without difficulty  Other:    Medical Decision Making  Medically screening exam initiated at 2:00 PM.  Appropriate orders placed.  Steven [name] was informed that the remainder of the evaluation will be completed by another provider, this initial triage assessment does not replace that evaluation, and the importance of remaining in the ED until their evaluation is complete.  Work up started   Janalee Mcmurray, PA-C 02/27/24 1401

## 2024-03-03 ENCOUNTER — Other Ambulatory Visit: Payer: Self-pay

## 2024-03-03 ENCOUNTER — Emergency Department (HOSPITAL_COMMUNITY)
Admission: EM | Admit: 2024-03-03 | Discharge: 2024-03-04 | Disposition: A | Discharge status: Home / Self Care | Attending: Emergency Medicine | Admitting: Emergency Medicine

## 2024-03-03 ENCOUNTER — Encounter (HOSPITAL_COMMUNITY): Payer: Self-pay

## 2024-03-03 DIAGNOSIS — R1084 Generalized abdominal pain: Secondary | ICD-10-CM | POA: Insufficient documentation

## 2024-03-03 DIAGNOSIS — K432 Incisional hernia without obstruction or gangrene: Secondary | ICD-10-CM | POA: Diagnosis not present

## 2024-03-03 DIAGNOSIS — E871 Hypo-osmolality and hyponatremia: Secondary | ICD-10-CM | POA: Insufficient documentation

## 2024-03-03 DIAGNOSIS — R112 Nausea with vomiting, unspecified: Secondary | ICD-10-CM | POA: Diagnosis not present

## 2024-03-03 DIAGNOSIS — R109 Unspecified abdominal pain: Secondary | ICD-10-CM | POA: Diagnosis not present

## 2024-03-03 DIAGNOSIS — K760 Fatty (change of) liver, not elsewhere classified: Secondary | ICD-10-CM | POA: Diagnosis not present

## 2024-03-03 LAB — COMPREHENSIVE METABOLIC PANEL WITH GFR
ALT: 12 U/L (ref 0–44)
AST: 25 U/L (ref 15–41)
Albumin: 3 g/dL — ABNORMAL LOW (ref 3.5–5.0)
Alkaline Phosphatase: 66 U/L (ref 38–126)
Anion gap: 7 (ref 5–15)
BUN: 11 mg/dL (ref 6–20)
CO2: 26 mmol/L (ref 22–32)
Calcium: 8.2 mg/dL — ABNORMAL LOW (ref 8.9–10.3)
Chloride: 100 mmol/L (ref 98–111)
Creatinine, Ser: 0.98 mg/dL (ref 0.61–1.24)
GFR, Estimated: >60 mL/min (ref >60)
Glucose, Bld: 101 mg/dL — ABNORMAL HIGH (ref 70–99)
Potassium: 3.6 mmol/L (ref 3.5–5.1)
Sodium: 133 mmol/L — ABNORMAL LOW (ref 135–145)
Total Bilirubin: 0.6 mg/dL (ref 0.0–1.2)
Total Protein: 6.5 g/dL (ref 6.5–8.1)

## 2024-03-03 LAB — CBC
HCT: 43.6 % (ref 39.0–52.0)
Hemoglobin: 14.6 g/dL (ref 13.0–17.0)
MCH: 34 pg (ref 26.0–34.0)
MCHC: 33.5 g/dL (ref 30.0–36.0)
MCV: 101.6 fL — ABNORMAL HIGH (ref 80.0–100.0)
Platelets: 183 10*3/uL (ref 150–400)
RBC: 4.29 MIL/uL (ref 4.22–5.81)
RDW: 13.8 % (ref 11.5–15.5)
WBC: 4.3 10*3/uL (ref 4.0–10.5)
nRBC: 0 % (ref 0.0–0.2)

## 2024-03-03 LAB — LIPASE, BLOOD: Lipase: 29 U/L (ref 11–51)

## 2024-03-03 LAB — URINALYSIS, ROUTINE W REFLEX MICROSCOPIC
Bilirubin Urine: NEGATIVE
Glucose, UA: NEGATIVE mg/dL
Hgb urine dipstick: NEGATIVE
Ketones, ur: NEGATIVE mg/dL
Leukocytes,Ua: NEGATIVE
Nitrite: NEGATIVE
Protein, ur: NEGATIVE mg/dL
Specific Gravity, Urine: 1.021 (ref 1.005–1.030)
pH: 5 (ref 5.0–8.0)

## 2024-03-03 MED ORDER — ONDANSETRON 4 MG PO TBDP
4.0000 mg | ORAL_TABLET | Freq: Once | ORAL | Status: AC | PRN
  Administered 2024-03-03: 4 mg via ORAL
  Filled 2024-03-03: qty 1

## 2024-03-03 NOTE — ED Notes (Signed)
Pt is aware we need a urine sample. 

## 2024-03-03 NOTE — ED Triage Notes (Signed)
 Continuing abdominal pain ongoing for weeks, pt is constipated. Fleet enema used at home and had small amount of brown liquid come out. Pt has vomited. Pt did have a BM yesterday.

## 2024-03-04 ENCOUNTER — Encounter (HOSPITAL_COMMUNITY): Payer: Self-pay

## 2024-03-04 ENCOUNTER — Emergency Department (HOSPITAL_COMMUNITY)

## 2024-03-04 DIAGNOSIS — K760 Fatty (change of) liver, not elsewhere classified: Secondary | ICD-10-CM | POA: Diagnosis not present

## 2024-03-04 DIAGNOSIS — R109 Unspecified abdominal pain: Secondary | ICD-10-CM | POA: Diagnosis not present

## 2024-03-04 DIAGNOSIS — K432 Incisional hernia without obstruction or gangrene: Secondary | ICD-10-CM | POA: Diagnosis not present

## 2024-03-04 MED ORDER — ONDANSETRON 8 MG PO TBDP: ORAL_TABLET | ORAL | 0 refills | Status: AC

## 2024-03-04 MED ORDER — SODIUM CHLORIDE 0.9 % IV BOLUS
500.0000 mL | INTRAVENOUS | Status: AC
  Administered 2024-03-04: 500 mL via INTRAVENOUS

## 2024-03-04 MED ORDER — IOHEXOL 300 MG/ML  SOLN
100.0000 mL | Freq: Once | INTRAMUSCULAR | Status: AC | PRN
  Administered 2024-03-04: 100 mL via INTRAVENOUS

## 2024-03-04 MED ORDER — KETOROLAC TROMETHAMINE 30 MG/ML IJ SOLN
15.0000 mg | Freq: Once | INTRAMUSCULAR | Status: AC
  Administered 2024-03-04: 15 mg via INTRAMUSCULAR
  Filled 2024-03-04: qty 1

## 2024-03-04 NOTE — ED Provider Notes (Signed)
 [place] [facility] AT [facility] Provider Note   CSN: [id number] Arrival date & time: 03/03/24  2139     History  Chief Complaint  Patient presents with   Abdominal Pain    Steven [name] is a 58 y.o. male.  The history is provided by the patient and a parent. The history is limited by the condition of the patient.  Abdominal Pain Pain location:  Generalized Pain radiates to:  Does not radiate Pain severity:  Moderate Onset quality:  Sudden Timing:  Constant Progression:  Unchanged Chronicity:  New Context: not alcohol use   Relieved by:  Nothing Worsened by:  Nothing Ineffective treatments:  None tried Associated symptoms: no anorexia, no chest pain, no fever and no shortness of breath   Risk factors: no alcohol abuse and no NSAID use        Home Medications Prior to Admission medications   Medication Sig Start Date End Date Taking? Authorizing Provider  ondansetron  (ZOFRAN -ODT) 8 MG disintegrating tablet 8mg  ODT q8 hours prn nausea 03/04/24  Yes Bethsaida Siegenthaler, MD  alum & mag hydroxide-simeth (MAALOX MAX) 400-400-40 MG/5ML suspension Take 5 mLs by mouth every 6 (six) hours as needed for indigestion. Patient not taking: Reported on 02/20/2024 07/14/23   Adolph Hoop, PA-C  amoxicillin -clavulanate (AUGMENTIN ) 875-125 MG tablet Take 1 tablet by mouth every 12 (twelve) hours. Patient not taking: Reported on 02/20/2024 01/28/24   Sueellen Emery, MD  Bacillus Coagulans-Inulin (ALIGN PREBIOTIC-PROBIOTIC) 5-1.25 MG-GM CHEW Chew 1 capsule by mouth daily. Patient not taking: Reported on 02/20/2024 01/28/24   Haviland, Julie, MD  Cholecalciferol (VITAMIN D  PO) Take 5,000 Units by mouth daily.  Patient not taking: Reported on 02/20/2024    [provider]  dicyclomine  (BENTYL ) 20 MG tablet Take 1 tablet (20 mg total) by mouth 2 (two) times daily as needed for spasms. Patient not taking: Reported on 02/20/2024 01/28/24   Sueellen Emery, MD  famotidine   (PEPCID ) 20 MG tablet Take 1 tablet (20 mg total) by mouth 2 times daily. Patient not taking: Reported on 02/20/2024 10/17/23   Benjiman Bras, MD  fluticasone  (FLONASE ) 50 MCG/ACT nasal spray Place 1 spray into both nostrils daily. Patient not taking: Reported on 02/20/2024 09/10/23   Benjiman Bras, MD  Multiple Vitamin (MULTIVITAMIN WITH MINERALS) TABS tablet Take 1 tablet by mouth daily. Patient not taking: Reported on 02/20/2024    [provider]  Omega-3 Fatty Acids (OMEGA-3 CF PO) Take 1,600 mg by mouth daily. Patient not taking: Reported on 02/20/2024    [provider]  omeprazole  (PRILOSEC) 20 MG capsule Take 1 capsule (20 mg total) by mouth daily. Patient not taking: Reported on 02/20/2024 08/27/23   Benjiman Bras, MD  ondansetron  (ZOFRAN -ODT) 4 MG disintegrating tablet Take 1 tablet (4 mg total) by mouth every 8 (eight) hours as needed. Patient not taking: Reported on 02/20/2024 01/28/24   Sueellen Emery, MD  triamcinolone  cream (KENALOG ) 0.1 % Apply 1 Application topically 2 (two) times daily. Patient not taking: Reported on 02/20/2024 04/20/23   Benjiman Bras, MD      Allergies    Patient has no known allergies.    Review of Systems   Review of Systems  Constitutional:  Negative for fever.  HENT:  Negative for facial swelling.   Eyes:  Negative for redness.  Respiratory:  Negative for shortness of breath, wheezing and stridor.   Cardiovascular:  Negative for chest pain.  Gastrointestinal:  Positive for abdominal pain.  Negative for anorexia.  All other systems reviewed and are negative.   Physical Exam Updated Vital Signs BP 103/65   Pulse (!) 41   Temp (!) 97.3 F (36.3 C)   Resp 17   Ht 5' (1.524 m)   Wt 75.8 kg   SpO2 100%   BMI 32.61 kg/m  Physical Exam Vitals and nursing note reviewed.  Constitutional:      General: He is not in acute distress.    Appearance: Normal appearance. He is well-developed. He is not diaphoretic.   HENT:     Head: Normocephalic and atraumatic.     Nose: Nose normal.  Eyes:     Conjunctiva/sclera: Conjunctivae normal.     Pupils: Pupils are equal, round, and reactive to light.  Cardiovascular:     Rate and Rhythm: Normal rate and regular rhythm.     Pulses: Normal pulses.     Heart sounds: Normal heart sounds.  Pulmonary:     Effort: Pulmonary effort is normal.     Breath sounds: Normal breath sounds. No wheezing or rales.  Abdominal:     General: Bowel sounds are normal.     Palpations: Abdomen is soft.     Tenderness: There is no abdominal tenderness. There is no guarding or rebound.  Musculoskeletal:        General: Normal range of motion.     Cervical back: Normal range of motion and neck supple.  Skin:    General: Skin is warm and dry.     Capillary Refill: Capillary refill takes less than 2 seconds.  Neurological:     General: No focal deficit present.     Mental Status: He is alert and oriented to person, place, and time.     Deep Tendon Reflexes: Reflexes normal.  Psychiatric:        Mood and Affect: Mood normal.        Behavior: Behavior normal.     ED Results / Procedures / Treatments   Labs (all labs ordered are listed, but only abnormal results are displayed) Results for orders placed or performed during the hospital encounter of 03/03/24  Lipase, blood   Collection Time: 03/03/24 10:03 PM  Result Value Ref Range   Lipase 29 11 - 51 U/L  Comprehensive metabolic panel   Collection Time: 03/03/24 10:03 PM  Result Value Ref Range   Sodium 133 (L) 135 - 145 mmol/L   Potassium 3.6 3.5 - 5.1 mmol/L   Chloride 100 98 - 111 mmol/L   CO2 26 22 - 32 mmol/L   Glucose, Bld 101 (H) 70 - 99 mg/dL   BUN 11 6 - 20 mg/dL   Creatinine, Ser 1.30 0.61 - 1.24 mg/dL   Calcium  8.2 (L) 8.9 - 10.3 mg/dL   Total Protein 6.5 6.5 - 8.1 g/dL   Albumin 3.0 (L) 3.5 - 5.0 g/dL   AST 25 15 - 41 U/L   ALT 12 0 - 44 U/L   Alkaline Phosphatase 66 38 - 126 U/L   Total Bilirubin  0.6 0.0 - 1.2 mg/dL   GFR, Estimated >86 >57 mL/min   Anion gap 7 5 - 15  CBC   Collection Time: 03/03/24 10:03 PM  Result Value Ref Range   WBC 4.3 4.0 - 10.5 K/uL   RBC 4.29 4.22 - 5.81 MIL/uL   Hemoglobin 14.6 13.0 - 17.0 g/dL   HCT 84.6 96.2 - 95.2 %   MCV 101.6 (H) 80.0 - 100.0 fL  MCH 34.0 26.0 - 34.0 pg   MCHC 33.5 30.0 - 36.0 g/dL   RDW 01.0 27.2 - 53.6 %   Platelets 183 150 - 400 K/uL   nRBC 0.0 0.0 - 0.2 %  Urinalysis, Routine w reflex microscopic -Urine, Clean Catch   Collection Time: 03/03/24 11:04 PM  Result Value Ref Range   Color, Urine YELLOW YELLOW   APPearance CLEAR CLEAR   Specific Gravity, Urine 1.021 1.005 - 1.030   pH 5.0 5.0 - 8.0   Glucose, UA NEGATIVE NEGATIVE mg/dL   Hgb urine dipstick NEGATIVE NEGATIVE   Bilirubin Urine NEGATIVE NEGATIVE   Ketones, ur NEGATIVE NEGATIVE mg/dL   Protein, ur NEGATIVE NEGATIVE mg/dL   Nitrite NEGATIVE NEGATIVE   Leukocytes,Ua NEGATIVE NEGATIVE   US  Abdomen Limited RUQ (LIVER/GB) Result Date: 03/04/2024 CLINICAL DATA:  Abdominal pain for 2 weeks EXAM: ULTRASOUND ABDOMEN LIMITED RIGHT UPPER QUADRANT COMPARISON:  Abdominal CT from earlier today FINDINGS: Gallbladder: Generally prominent wall thickness likely from under distension. No cholelithiasis or focal tenderness. Common bile duct: Diameter: 3 mm Liver: No focal lesion identified. Within normal limits in parenchymal echogenicity. Portal vein is patent on color Doppler imaging with normal direction of blood flow towards the liver. IMPRESSION: No acute finding.  No cholelithiasis or Murphy sign. Electronically Signed   By: Ronnette Coke M.D.   On: 03/04/2024 05:14   CT ABDOMEN PELVIS W CONTRAST Result Date: 03/04/2024 CLINICAL DATA:  Ongoing abdominal pain for weeks. Incisional hernia repair with mesh. EXAM: CT ABDOMEN AND PELVIS WITH CONTRAST TECHNIQUE: Multidetector CT imaging of the abdomen and pelvis was performed using the standard protocol following bolus  administration of intravenous contrast. RADIATION DOSE REDUCTION: This exam was performed according to the departmental dose-optimization program which includes automated exposure control, adjustment of the mA and/or kV according to patient size and/or use of iterative reconstruction technique. CONTRAST:  OMNIPAQUE  IOHEXOL  300 MG/ML  SOLN COMPARISON:  CT abdomen pelvis 02/27/2024 FINDINGS: Lower chest: No acute abnormality. Hepatobiliary: Hepatic steatosis. Similar prominent enhancement of the nondistended gallbladder wall. There is adjacent hyperemia of the liver parenchyma about the gallbladder fossa. Findings are suggestive of gallbladder wall inflammation. No radiopaque stone or biliary dilation. Pancreas: Unremarkable. Spleen: Unremarkable. Adrenals/Urinary Tract: Normal adrenal glands. No urinary calculi or hydronephrosis. Unremarkable bladder. Stomach/Bowel: Normal caliber large and small bowel. No bowel wall thickening. Stomach is within normal limits. Moderate colonic stool burden. The appendix is normal. Vascular/Lymphatic: No significant vascular findings are present. No enlarged abdominal or pelvic lymph nodes. Reproductive: Unremarkable. Other: Mild stranding in the anterior upper abdomen likely due to hernia repair. No organized fluid collection or abscess. No free intraperitoneal air. Musculoskeletal: No acute fracture. IMPRESSION: 1. Similar prominent enhancement of the nondistended gallbladder wall with adjacent hyperemia of the liver parenchyma about the gallbladder fossa. Findings are suggestive of gallbladder wall inflammation. Recommend further evaluation with right upper quadrant ultrasound. 2. Mild stranding in the anterior upper abdomen likely due to hernia repair. No organized fluid collection or abscess. 3. Hepatic steatosis. Electronically Signed   By: Rozell Cornet M.D.   On: 03/04/2024 03:34   CT ABDOMEN PELVIS W CONTRAST Result Date: 02/27/2024 CLINICAL DATA:  Abdominal pain,  acute, nonlocalized. EXAM: CT ABDOMEN AND PELVIS WITH CONTRAST TECHNIQUE: Multidetector CT imaging of the abdomen and pelvis was performed using the standard protocol following bolus administration of intravenous contrast. RADIATION DOSE REDUCTION: This exam was performed according to the departmental dose-optimization program which includes automated exposure control, adjustment of the  mA and/or kV according to patient size and/or use of iterative reconstruction technique. CONTRAST:  80mL OMNIPAQUE  IOHEXOL  300 MG/ML  SOLN COMPARISON:  02/15/2024 FINDINGS: Lower chest: Lung bases are clear.  No pleural effusions. Hepatobiliary: Decreased attenuation of the liver is suggestive for steatosis. Enhancement of gallbladder wall without gallbladder distension. Gallbladder wall enhancement may be related to the phase of contrast. Main portal venous system is patent. No significant biliary dilatation. 8 mm hypodensity in the inferior right hepatic lobe on image 20/12 is likely an incidental finding. Pancreas: Unremarkable. No pancreatic ductal dilatation or surrounding inflammatory changes. Spleen: Tiny hypodensities in the spleen are similar to the previous examination and nonspecific. No splenomegaly. Adrenals/Urinary Tract: Adrenal glands are within normal limits. Normal appearance of both kidneys without hydronephrosis. No suspicious renal lesions. Urinary bladder is decompressed and difficult to evaluate for wall thickening. Stomach/Bowel: Stomach is within normal limits. No bowel dilatation. No focal bowel inflammation. Vascular/Lymphatic: No significant vascular findings are present. No enlarged abdominal or pelvic lymph nodes. Reproductive: Prostate is unremarkable. Other: Negative for free fluid. Negative for free air. Postsurgical changes along the anterior abdominal wall compatible with a ventral hernia repair. Musculoskeletal: No acute bone abnormality. Focal sclerosis in right pubic bone is similar to the prior  examination and likely an incidental finding. Levoscoliosis in lumbar spine. Multilevel degenerative changes in lumbar spine. IMPRESSION: 1. No acute abnormality in the abdomen or pelvis. 2. Hepatic steatosis. 3. Prominent enhancement of the gallbladder wall is nonspecific and may be related to the phase of contrast. No gallbladder distension. If there is clinical concern for acute cholecystitis, consider further evaluation with right upper quadrant ultrasound. 4. Tiny hypodensities in the spleen are nonspecific but likely incidental findings. Electronically Signed   By: Elene Griffes M.D.   On: 02/27/2024 16:25   CT ABDOMEN PELVIS W CONTRAST Result Date: 02/15/2024 CLINICAL DATA:  Abdominal pain, acute, nonlocalized EXAM: CT ABDOMEN AND PELVIS WITH CONTRAST TECHNIQUE: Multidetector CT imaging of the abdomen and pelvis was performed using the standard protocol following bolus administration of intravenous contrast. RADIATION DOSE REDUCTION: This exam was performed according to the departmental dose-optimization program which includes automated exposure control, adjustment of the mA and/or kV according to patient size and/or use of iterative reconstruction technique. CONTRAST:  OMNIPAQUE  IOHEXOL  300 MG/ML  SOLN COMPARISON:  January 28, 2024 FINDINGS: Lower chest: Mild cardiomegaly. Posterior bibasilar dependent atelectasis. Hepatobiliary: No mass.No radiopaque stones or wall thickening of the gallbladder.No intrahepatic or extrahepatic biliary ductal dilation. Pancreas: No mass or main ductal dilation.No peripancreatic inflammation or fluid collection. Spleen: Similar appearance of multiple subcentimeter hypodensities, which may reflect multiple cysts or hemangiomas. No mass. Adrenals/Urinary Tract: No adrenal masses. No renal mass. No nephrolithiasis or hydronephrosis. The urinary bladder is distended without focal abnormality. Stomach/Bowel: The stomach is decompressed without focal abnormality. No small bowel  wall thickening or inflammation. No small bowel obstruction. The appendix was not visualized. No right lower quadrant or pericecal inflammatory changes to suggest acute appendicitis. Vascular/Lymphatic: No aortic aneurysm. No intraabdominal or pelvic lymphadenopathy. Reproductive: No prostatomegaly.No free pelvic fluid. Other: No pneumoperitoneum, ascites, or mesenteric inflammation. Musculoskeletal: No acute fracture or destructive lesion.Small volume symmetric bilateral gynecomastia. Multilevel degenerative disc disease of the spine. Mild grade 1 anterolisthesis of L4 on L5. IMPRESSION: No acute intra-abdominal or pelvic abnormality. Electronically Signed   By: Rance Burrows M.D.   On: 02/15/2024 13:42     Radiology US  Abdomen Limited RUQ (LIVER/GB) Result Date: 03/04/2024 CLINICAL DATA:  Abdominal pain  for 2 weeks EXAM: ULTRASOUND ABDOMEN LIMITED RIGHT UPPER QUADRANT COMPARISON:  Abdominal CT from earlier today FINDINGS: Gallbladder: Generally prominent wall thickness likely from under distension. No cholelithiasis or focal tenderness. Common bile duct: Diameter: 3 mm Liver: No focal lesion identified. Within normal limits in parenchymal echogenicity. Portal vein is patent on color Doppler imaging with normal direction of blood flow towards the liver. IMPRESSION: No acute finding.  No cholelithiasis or Murphy sign. Electronically Signed   By: Ronnette Coke M.D.   On: 03/04/2024 05:14   CT ABDOMEN PELVIS W CONTRAST Result Date: 03/04/2024 CLINICAL DATA:  Ongoing abdominal pain for weeks. Incisional hernia repair with mesh. EXAM: CT ABDOMEN AND PELVIS WITH CONTRAST TECHNIQUE: Multidetector CT imaging of the abdomen and pelvis was performed using the standard protocol following bolus administration of intravenous contrast. RADIATION DOSE REDUCTION: This exam was performed according to the departmental dose-optimization program which includes automated exposure control, adjustment of the mA and/or kV  according to patient size and/or use of iterative reconstruction technique. CONTRAST:  OMNIPAQUE  IOHEXOL  300 MG/ML  SOLN COMPARISON:  CT abdomen pelvis 02/27/2024 FINDINGS: Lower chest: No acute abnormality. Hepatobiliary: Hepatic steatosis. Similar prominent enhancement of the nondistended gallbladder wall. There is adjacent hyperemia of the liver parenchyma about the gallbladder fossa. Findings are suggestive of gallbladder wall inflammation. No radiopaque stone or biliary dilation. Pancreas: Unremarkable. Spleen: Unremarkable. Adrenals/Urinary Tract: Normal adrenal glands. No urinary calculi or hydronephrosis. Unremarkable bladder. Stomach/Bowel: Normal caliber large and small bowel. No bowel wall thickening. Stomach is within normal limits. Moderate colonic stool burden. The appendix is normal. Vascular/Lymphatic: No significant vascular findings are present. No enlarged abdominal or pelvic lymph nodes. Reproductive: Unremarkable. Other: Mild stranding in the anterior upper abdomen likely due to hernia repair. No organized fluid collection or abscess. No free intraperitoneal air. Musculoskeletal: No acute fracture. IMPRESSION: 1. Similar prominent enhancement of the nondistended gallbladder wall with adjacent hyperemia of the liver parenchyma about the gallbladder fossa. Findings are suggestive of gallbladder wall inflammation. Recommend further evaluation with right upper quadrant ultrasound. 2. Mild stranding in the anterior upper abdomen likely due to hernia repair. No organized fluid collection or abscess. 3. Hepatic steatosis. Electronically Signed   By: Rozell Cornet M.D.   On: 03/04/2024 03:34    Procedures Procedures    Medications Ordered in ED Medications  ondansetron  (ZOFRAN -ODT) disintegrating tablet 4 mg (4 mg Oral Given 03/03/24 2158)  sodium chloride  0.9 % bolus 500 mL (0 mLs Intravenous Stopped 03/04/24 0300)  iohexol  (OMNIPAQUE ) 300 MG/ML solution 100 mL (100 mLs Intravenous  Contrast Given 03/04/24 0301)  ketorolac  (TORADOL ) 30 MG/ML injection 15 mg (15 mg Intramuscular Given 03/04/24 0252)    ED Course/ Medical Decision Making/ A&P                                 Medical Decision Making Was on the commode having a BM and then vomited   Amount and/or Complexity of Data Reviewed Independent Historian: parent    Details: See above  External Data Reviewed: notes.    Details: Previous notes reviewed  Labs: ordered.    Details: Urine is negative. Normal white count 4.3, normal hemoglobin 14.6, normal platelets. Lipase 29. Sodium slight low 133, normal potassium 3.6, normal creatinine Radiology: ordered and independent interpretation performed.    Details: Normal CT by me   Risk Prescription drug management. Risk Details: Well appearing, exam is benign and reassuring  as is imaging.  Stable for discharge.      Final Clinical Impression(s) / ED Diagnoses Final diagnoses:  Nausea and vomiting, unspecified vomiting type   No signs of systemic illness or infection. The patient is nontoxic-appearing on exam and vital signs are within normal limits.  I have reviewed the triage vital signs and the nursing notes. Pertinent labs & imaging results that were available during my care of the patient were reviewed by me and considered in my medical decision making (see chart for details). After history, exam, and medical workup I feel the patient has been appropriately medically screened and is safe for discharge home. Pertinent diagnoses were discussed with the patient. Patient was given return precautions.  Rx / DC Orders ED Discharge Orders          Ordered    ondansetron  (ZOFRAN -ODT) 8 MG disintegrating tablet        03/04/24 0539              Olman Yono, MD 03/04/24 1610

## 2024-03-10 ENCOUNTER — Ambulatory Visit (INDEPENDENT_AMBULATORY_CARE_PROVIDER_SITE_OTHER): Admitting: Family Medicine

## 2024-03-10 VITALS — BP 108/60 | HR 45 | Temp 98.2°F | Ht 60.0 in | Wt 163.0 lb

## 2024-03-10 DIAGNOSIS — I495 Sick sinus syndrome: Secondary | ICD-10-CM

## 2024-03-10 DIAGNOSIS — R109 Unspecified abdominal pain: Secondary | ICD-10-CM

## 2024-03-10 DIAGNOSIS — E871 Hypo-osmolality and hyponatremia: Secondary | ICD-10-CM

## 2024-03-10 DIAGNOSIS — R1032 Left lower quadrant pain: Secondary | ICD-10-CM | POA: Diagnosis not present

## 2024-03-10 LAB — BASIC METABOLIC PANEL WITH GFR
BUN: 11 mg/dL (ref 6–23)
CO2: 30 meq/L (ref 19–32)
Calcium: 9.9 mg/dL (ref 8.4–10.5)
Chloride: 101 meq/L (ref 96–112)
Creatinine, Ser: 0.9 mg/dL (ref 0.40–1.50)
GFR: 93.2 mL/min (ref >60.00)
Glucose, Bld: 75 mg/dL (ref 70–99)
Potassium: 4.6 meq/L (ref 3.5–5.1)
Sodium: 139 meq/L (ref 135–145)

## 2024-03-10 LAB — CBC
HCT: 48.5 % (ref 39.0–52.0)
Hemoglobin: 16.1 g/dL (ref 13.0–17.0)
MCHC: 33.1 g/dL (ref 30.0–36.0)
MCV: 102.6 fl — ABNORMAL HIGH (ref 78.0–100.0)
Platelets: 217 10*3/uL (ref 150.0–400.0)
RBC: 4.73 Mil/uL (ref 4.22–5.81)
RDW: 14.7 % (ref 11.5–15.5)
WBC: 3.8 10*3/uL — ABNORMAL LOW (ref 4.0–10.5)

## 2024-03-10 MED ORDER — DICYCLOMINE HCL 10 MG PO CAPS: 10.0000 mg | ORAL_CAPSULE | Freq: Two times a day (BID) | ORAL | 1 refills | Status: DC

## 2024-03-10 NOTE — Progress Notes (Unsigned)
 Subjective:  Patient ID: Steven [name], male    DOB: [birth year]  Age: 58 y.o. MRN: [id number]  CC:  Chief Complaint  Patient presents with   Nausea    Pt went to ED for N/V on 03/03/2024, also Constipation on 4/30, Abdominal pain 02/15/2024, has BM inconsistent but seems to be more frequent than previous weeks, cramping Lt side of abdomen, no more N/V since hospital visit     HPI Ramzey Fedak presents for  ER follow-up for nausea and vomiting May 5, constipation April 30, abdominal pain April 18.   Abdominal pain ER visit noted from April 18.  Urine did not appear to be consistent with a UTI.  ER notes indicated 200 cc of urine left in the bladder after urination.  Possible need to follow-up with urology for possible bladder emptying issues.  No significant amount of stool noted at that visit on imaging.  CT abdomen pelvis without acute intra-abdominal or pelvic abnormality.  Of note he did have some significant bradycardia at that time discussed with cardiology with outpatient cardiology follow-up planned. Cardiology follow-up noted from April 23 with Dr. Rodolfo Clan.  Sinus node dysfunction seemingly progressive with junctional rhythm bradycardia and progressive exercise intolerance, some lightheadedness may also be related to this.  Event recorder planned to look for chronotropic competence and then possible pacing.  Plan for follow-up with Dr. Daneil Dunker after event monitor [facility] XT) results to discuss pacing.  ER visit noted from April 30 -presented with approximately 3-week history of periumbilical pain.  Noted periumbilical pain with urge for a bowel movement 15 minutes after eating or lying down for period of time.  Urges were occurring 5-6 times per day but only passing hard piece of stool once per day.  Had been on MiraLAX  for 2 weeks without improvement and prior bowel movement night prior.  Repeat CT abdomen pelvis, no acute abnormality.  Prominent enhancement of gallbladder wall but nonspecific  and may be related to phase of contrast as no gallbladder distention.  Hepatic steatosis noted.  Likely incidental findings noted on hypodensities in the spleen.  Rectal exam without evidence of stool impaction.  Creatinine 1.28.  Symptoms were not consistent with gallbladder etiology.  Over-the-counter treatments including Fleet enemas discussed and plan for follow-up with GI, Dr. Nickey Barn.   ER visit May 5 for abdominal pain.  Sodium 133, lipase normal, creatinine improved at 0.98, normal LFTs, no leukocytosis, normal hemoglobin.  Negative urinalysis.  CT abdomen pelvis with similar prominent enhancement of nondistended gallbladder wall, mild stranding anterior upper abdomen likely due to hernia repair, no organized fluid collection or abscess, hepatic steatosis.  Right upper quadrant ultrasound without acute finding and specifically no cholelithiasis or Murphy sign.  Zofran  given for nausea.  Here with parents today  Last saw Dr. Nickey Barn few weeks ago prior to these issues. No specific treatment based on some of those various symptoms.  No recent vomiting.  Bowel movements have been soft, daily bowel movements, with miralax  daily.  He has continued to say he has some pain when he needs to have a bowel movement. Cramping feeling. Citrucel once per day.  No recent use of bentyl .  Cramping in am, after meals - often 10-62min after meals.    Urinates frequently - every 45 min on a drive. Has seen urology - Dr. Parke Boll on 4/29 - 32cc on postvoid residual testing at that time. Trial of alpha blocker with Flomax. Possible contribution of constipation.  More urine produced with flomax.  With history of tachybradycardia syndrome, they did complete the monitor and sent that to cardiology last week.  Had not yet heard back from cardiology.    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/22/2022   Sinus pause 11/22/2022   Sinus arrest 11/22/2022   Sleep  apnea 11/22/2022   Obesity (BMI 35.0-39.9 without comorbidity) 11/22/2022   Postural dizziness with presyncope 10/21/2022   Junctional bradycardia 10/21/2022   Incarcerated incisional hernia 07/17/2017   Past Medical History:  Diagnosis Date   Complication of anesthesia    per patient father, patient was slow to wake with a previous surgery    Down syndrome    Incisional hernia    MR (mental retardation)    Past Surgical History:  Procedure Laterality Date   BIOPSY  12/07/2023   Procedure: BIOPSY;  Surgeon: Alvis Jourdain, MD;  Location: [facility];  Service: Gastroenterology;;   ESOPHAGOGASTRODUODENOSCOPY (EGD) WITH PROPOFOL  N/A 12/07/2023   Procedure: ESOPHAGOGASTRODUODENOSCOPY (EGD) WITH PROPOFOL ;  Surgeon: Alvis Jourdain, MD;  Location: [facility];  Service: Gastroenterology;  Laterality: N/A;   HERNIA REPAIR     unaware of the year of , maybe 10 years ago    INCISIONAL HERNIA REPAIR N/A 07/17/2017   Procedure: LAPAROSCOPIC INCISIONAL HERNIA WITH MESH;  Surgeon: Oza Blumenthal, MD;  Location: [facility];  Service: General;  Laterality: N/A;   INSERTION OF MESH N/A 07/17/2017   Procedure: INSERTION OF MESH;  Surgeon: Oza Blumenthal, MD;  Location: [facility];  Service: General;  Laterality: N/A;   No Known Allergies Prior to Admission medications   Medication Sig Start Date End Date Taking? Authorizing Provider  tamsulosin (FLOMAX) 0.4 MG CAPS capsule Take 0.4 mg by mouth daily after breakfast.   Yes [provider]  alum & mag hydroxide-simeth (MAALOX MAX) 400-400-40 MG/5ML suspension Take 5 mLs by mouth every 6 (six) hours as needed for indigestion. Patient not taking: Reported on 03/10/2024 07/14/23   Adolph Hoop, PA-C  amoxicillin -clavulanate (AUGMENTIN ) 875-125 MG tablet Take 1 tablet by mouth every 12 (twelve) hours. Patient not taking: Reported on 03/10/2024 01/28/24   Sueellen Emery, MD  Bacillus Coagulans-Inulin (ALIGN PREBIOTIC-PROBIOTIC) 5-1.25 MG-GM CHEW Chew 1  capsule by mouth daily. Patient not taking: Reported on 03/10/2024 01/28/24   Haviland, Julie, MD  Cholecalciferol (VITAMIN D  PO) Take 5,000 Units by mouth daily.  Patient not taking: Reported on 02/20/2024    [provider]  dicyclomine  (BENTYL ) 20 MG tablet Take 1 tablet (20 mg total) by mouth 2 (two) times daily as needed for spasms. Patient not taking: Reported on 03/10/2024 01/28/24   Sueellen Emery, MD  famotidine  (PEPCID ) 20 MG tablet Take 1 tablet (20 mg total) by mouth 2 times daily. Patient not taking: Reported on 03/10/2024 10/17/23   Benjiman Bras, MD  fluticasone  (FLONASE ) 50 MCG/ACT nasal spray Place 1 spray into both nostrils daily. Patient not taking: Reported on 03/10/2024 09/10/23   Benjiman Bras, MD  Multiple Vitamin (MULTIVITAMIN WITH MINERALS) TABS tablet Take 1 tablet by mouth daily. Patient not taking: Reported on 02/20/2024    [provider]  Omega-3 Fatty Acids (OMEGA-3 CF PO) Take 1,600 mg by mouth daily. Patient not taking: Reported on 02/20/2024    [provider]  omeprazole  (PRILOSEC) 20 MG capsule Take 1 capsule (20 mg total) by mouth daily. Patient not taking: Reported on 03/10/2024 08/27/23   Benjiman Bras, MD  ondansetron  (ZOFRAN -ODT) 4 MG disintegrating tablet Take  1 tablet (4 mg total) by mouth every 8 (eight) hours as needed. Patient not taking: Reported on 03/10/2024 01/28/24   Sueellen Emery, MD  ondansetron  (ZOFRAN -ODT) 8 MG disintegrating tablet 8mg  ODT q8 hours prn nausea Patient not taking: Reported on 03/10/2024 03/04/24   Palumbo, April, MD  triamcinolone  cream (KENALOG ) 0.1 % Apply 1 Application topically 2 (two) times daily. Patient not taking: Reported on 03/10/2024 04/20/23   Benjiman Bras, MD   Social History   Socioeconomic History   Marital status: Single    Spouse name: Not on file   Number of children: Not on file   Years of education: Not on file   Highest education level: Not on file  Occupational  History   Not on file  Tobacco Use   Smoking status: Never   Smokeless tobacco: Never  Vaping Use   Vaping status: Never Used  Substance and Sexual Activity   Alcohol use: Not Currently    Comment: occ   Drug use: No   Sexual activity: Not on file  Other Topics Concern   Not on file  Social History Narrative   Not on file   Social Drivers of Health   Financial Resource Strain: Not on file  Food Insecurity: Not on file  Transportation Needs: Not on file  Physical Activity: Not on file  Stress: Not on file  Social Connections: Not on file  Intimate Partner Violence: Not on file    Review of Systems Per HPI  Objective:   Vitals:   03/10/24 1114  BP: 108/60  Pulse: (!) 45  Temp: 98.2 F (36.8 C)  TempSrc: Temporal  SpO2: 97%  Weight: 163 lb (73.9 kg)  Height: 5' (1.524 m)     Physical Exam Vitals reviewed.  Constitutional:      Appearance: He is well-developed.  HENT:     Head: Normocephalic and atraumatic.  Neck:     Vascular: No carotid bruit or JVD.  Cardiovascular:     Rate and Rhythm: Bradycardia present.     Heart sounds: Normal heart sounds. No murmur heard. Pulmonary:     Effort: Pulmonary effort is normal.     Breath sounds: Normal breath sounds. No rales.  Abdominal:     General: There is no distension.     Tenderness: There is abdominal tenderness (Very minimal discomfort without guarding in the left lower quadrant.  Nondistended.). There is no guarding or rebound.  Musculoskeletal:     Right lower leg: No edema.     Left lower leg: No edema.  Skin:    General: Skin is warm and dry.  Neurological:     Mental Status: He is alert and oriented to person, place, and time.  Psychiatric:        Mood and Affect: Mood normal.        Assessment & Plan:  Ismeal Wehrs is a 58 y.o. male . Abdominal cramping - Plan: dicyclomine  (BENTYL ) 10 MG capsule, CBC LLQ abdominal pain - Plan: CBC  - Recurrent abdominal pain, cramping with bowel  movements only approximately once per day at this time.  Constipation concerns previously, but appears that his overall stable at this time with softer bowel movements and daily bowel movements with use of fiber supplement and MiraLAX .  Has taken Bentyl  in the past and initially held off on that temporarily due to concerns for constipation but I think a low-dose scheduled twice per day may be reasonable for now until he can follow-up  with gastroenterology.  10 mg twice daily dose ordered for now with RTC precautions.  Advised to call GI for follow-up from recent ER visits and RTC precautions given if any new or worsening symptoms.  All questions were answered with patient, parents.  Tachycardia-bradycardia syndrome (HCC)  - Status post monitoring as above but has not yet received results.  Recommended contacting cardiology if they have not heard from them regarding results in the next day or 2.  Based on prior notes with EP/cardiology, possible need for pacer.  Hyponatremia - Plan: Basic metabolic panel with GFR  - Noted in ER, check BMP and adjust plan accordingly.  49 minutes spent during visit, including chart review, ER record reviews,  counseling and assimilation of information, exam, discussion of plan, and chart completion.   Meds ordered this encounter  Medications   dicyclomine  (BENTYL ) 10 MG capsule    Sig: Take 1 capsule (10 mg total) by mouth in the morning and at bedtime.    Dispense:  60 capsule    Refill:  1   Patient Instructions  We can try a low-dose of dicyclomine  twice per day for now to see if that helps with cramping but will need to watch and make sure that does not worsen previous constipation.  Stay on Citrucel daily and MiraLAX  to help with bowel regimen.  If you do notice any constipation or worsening of symptoms, stop the dicyclomine  and let me know.  Please call gastroenterology to be seen as soon as possible to follow-up on these abdominal symptoms and ER visits.  I do  recommend discussing a question of colonoscopy or further testing/treatment with gastroenterology.  Keep me posted if there are questions in the meantime.  Call cardiology to make sure they received the results of the monitor and to discuss next step.  Hang in there!  Return to the clinic or go to the nearest emergency room if any of your symptoms worsen or new symptoms occur.     Signed,   Caro Christmas, MD [facility], [facility] 03/10/24 11:26 AM

## 2024-03-10 NOTE — Patient Instructions (Signed)
 We can try a low-dose of dicyclomine  twice per day for now to see if that helps with cramping but will need to watch and make sure that does not worsen previous constipation.  Stay on Citrucel daily and MiraLAX  to help with bowel regimen.  If you do notice any constipation or worsening of symptoms, stop the dicyclomine  and let me know.  Please call gastroenterology to be seen as soon as possible to follow-up on these abdominal symptoms and ER visits.  I do recommend discussing a question of colonoscopy or further testing/treatment with gastroenterology.  Keep me posted if there are questions in the meantime.  Call cardiology to make sure they received the results of the monitor and to discuss next step.  Hang in there!  Return to the clinic or go to the nearest emergency room if any of your symptoms worsen or new symptoms occur.

## 2024-03-11 ENCOUNTER — Encounter: Payer: Self-pay | Admitting: Family Medicine

## 2024-03-11 ENCOUNTER — Ambulatory Visit: Payer: Self-pay | Admitting: Family Medicine

## 2024-03-11 DIAGNOSIS — R001 Bradycardia, unspecified: Secondary | ICD-10-CM | POA: Diagnosis not present

## 2024-03-11 DIAGNOSIS — I495 Sick sinus syndrome: Secondary | ICD-10-CM | POA: Diagnosis not present

## 2024-03-12 ENCOUNTER — Ambulatory Visit: Payer: Self-pay | Admitting: Cardiology

## 2024-03-12 DIAGNOSIS — I495 Sick sinus syndrome: Secondary | ICD-10-CM | POA: Diagnosis not present

## 2024-03-12 DIAGNOSIS — R001 Bradycardia, unspecified: Secondary | ICD-10-CM

## 2024-03-17 NOTE — Telephone Encounter (Signed)
Pt's father has been notified.

## 2024-03-17 NOTE — Telephone Encounter (Signed)
-----   Message from Benjiman Bras sent at 03/16/2024  9:00 PM EDT ----- Results sent by MyChart, but appears patient has not yet reviewed those results.  Please call and make sure they have either seen note or discuss result note.  Thanks.

## 2024-03-27 NOTE — Progress Notes (Signed)
 " Electrophysiology Office Note:   Date:  03/28/2024  ID:  Steven [name], DOB 16-Jun-1966, MRN [id number]  Primary Cardiologist: None Electrophysiologist: Fonda Kitty, MD      History of Present Illness:   Steven [name] is a 58 y.o. male with h/o Down syndrome, sleep apnea and sinus bradycardia who is being seen today for follow up.   Discussed the use of AI scribe software for clinical note transcription with the patient, who gave verbal consent to proceed.  History of Present Illness Patient is accompanied by both of his parents which provide much of the history.  He experiences significant fatigue and shortness of breath, which have progressively worsened over the past year. Previously, he could walk around [facility] without difficulty, but now he becomes exhausted after only a quarter of the way. Shortness of breath worsens with exertion.His heart rate has been noted to slow down significantly, particularly during waking hours. THis sleep schedule varies, with waking times between 6 and 10 AM and a usual bedtime around 10 PM. His bradycardia has impacted his ability to perform tasks at work, as he is unable to keep up with the demands due to his slow pace. Episodes of bradycardia have been observed during both sleep and waking hours. He experiences episodes of intestinal cramps immediately after eating meals and has an upcoming appointment with gastroenterology to address these issues. He has a history of hernia surgery, which healed without complications. He does not have a tendency to pick at surgical sites or fixate on them, which is relevant to his potential compliance with post-surgical care  Review of systems complete and found to be negative unless listed in HPI.   EP Information / Studies Reviewed:    EKG is ordered today. Personal review as below.  EKG Interpretation Date/Time:  Friday Mar 28 2024 11:48:28 EDT Ventricular Rate:  53 PR Interval:    QRS Duration:  72 QT  Interval:  420 QTC Calculation: 394 R Axis:   67  Text Interpretation: Marked sinus bradycardia with A-V dissociation and Junctional rhythm with Sinus/atrial capture When compared with ECG of 27-Feb-2024 13:45, No significant change since Confirmed by Kitty Fonda [phone]) on 03/28/2024 11:53:34 AM   EKG 02/27/24: Sinus bradycardia with competing junctional rhythm.   Zio 01/2024:   Echo 10/22/22:  1. Left ventricular ejection fraction, by estimation, is 60 to 65%. The  left ventricle has normal function. The left ventricle has no regional  wall motion abnormalities. There is mild concentric left ventricular  hypertrophy. Left ventricular diastolic  function could not be evaluated.   2. Right ventricular systolic function is low normal. The right  ventricular size is normal. Tricuspid regurgitation signal is inadequate  for assessing PA pressure.   3. The mitral valve is grossly normal. No evidence of mitral valve  regurgitation. No evidence of mitral stenosis.   4. The aortic valve is tricuspid. Aortic valve regurgitation is mild.  Aortic valve sclerosis is present, with no evidence of aortic valve  stenosis.   5. The inferior vena cava is normal in size with greater than 50%  respiratory variability, suggesting right atrial pressure of 3 mmHg.   Physical Exam:   VS:  BP (!) 82/52   Pulse (!) 53   Ht 5' (1.524 m)   Wt 158 lb (71.7 kg)   SpO2 97%   BMI 30.86 kg/m    Wt Readings from Last 3 Encounters:  03/28/24 158 lb (71.7 kg)  03/10/24 163 lb (73.9 kg)  03/03/24 167 lb (75.8 kg)     GEN: Well nourished, well developed in no acute distress NECK: No JVD CARDIAC: Bradycardic, irregular RESPIRATORY:  Clear to auscultation without rales, wheezing or rhonchi  ABDOMEN: Soft, non-distended EXTREMITIES:  No edema; No deformity   ASSESSMENT AND PLAN:   [name] is a 58 year old male with Down syndrome and severe obstructive sleep apnea who was found to have severe sinus node  dysfunction.  At rest he has significant sinus bradycardia with a competing junctional rhythm.  While wearing a ZIO monitor he had frequent pauses and episodes of bradycardia with a predominant rhythm of junctional bradycardia.  Description of his exertional fatigue and shortness of breath could certainly be secondary to his chronotropic incompetence and A-V dissociation.   #. Sinus node dysfunction:  #. Symptomatic bradycardia:  #. Junctional rhythm -I had an extensive discussion with the patient and his parents regarding his condition and possible role of pacemaker implant.  We collectively feel that his bradycardia and sinus node dysfunction are likely contributing to his symptoms.  Thus, he would meet criteria for permanent pacemaker implant in the setting of symptomatic bradycardia and sinus node dysfunction.  Risk and benefits of permanent pacemaker implant were extensively discussed with the patient and his family.  We discussed options for both leadless and transvenous pacing.  Parents do not feel that transvenous system, device pocket and wound would be of an issue for patient.  He has had surgeries previously and did not interfere with wound healing.  Family would like to take time to think about their options and will let us  know how they would like to proceed.  #. Severe sleep apnea - CPAP use.   #. Down syndrome:  - If pacemaker is to be performed, we will perform with anesthesia assistance for patient comfort and cooperation.   Follow up with Dr. Kennyth in 3 months  Total time of encounter: 60 minutes total time of encounter, including chart review, face-to-face patient care, coordination of care and counseling regarding high complexity medical decision making.  Signed, Fonda Kennyth, MD  "

## 2024-03-28 ENCOUNTER — Encounter: Payer: Self-pay | Admitting: Cardiology

## 2024-03-28 ENCOUNTER — Ambulatory Visit: Attending: Cardiology | Admitting: Cardiology

## 2024-03-28 VITALS — BP 82/52 | HR 53 | Ht 60.0 in | Wt 158.0 lb

## 2024-03-28 DIAGNOSIS — G4733 Obstructive sleep apnea (adult) (pediatric): Secondary | ICD-10-CM

## 2024-03-28 DIAGNOSIS — R001 Bradycardia, unspecified: Secondary | ICD-10-CM

## 2024-03-28 DIAGNOSIS — Q909 Down syndrome, unspecified: Secondary | ICD-10-CM

## 2024-03-28 DIAGNOSIS — I495 Sick sinus syndrome: Secondary | ICD-10-CM

## 2024-03-28 NOTE — Patient Instructions (Signed)
 Medication Instructions:  Your physician recommends that you continue on your current medications as directed. Please refer to the Current Medication list given to you today.  *If you need a refill on your cardiac medications before your next appointment, please call your pharmacy*  Lab Work: None ordered  If you have any lab test that is abnormal or we need to change your treatment, we will call you to review the results.  Testing/Procedures: None ordered  Follow-Up: At [facility], you and your health needs are our priority.  As part of our continuing mission to provide you with exceptional heart care, our providers are all part of one team.  This team includes your primary Cardiologist (physician) and Advanced Practice Providers or APPs (Physician Assistants and Nurse Practitioners) who all work together to provide you with the care you need, when you need it.  Your next appointment:   6 month(s)  Provider:   Ardeen Kohler, MD     Thank you for choosing [facility]!!   Steven Cane, RN [phone]   Other Instructions  Please review and let us  know if you would like to proceed with a pacemaker   Pacemaker Implantation, Adult Pacemaker implantation is a procedure to put a pacemaker in the chest. A pacemaker is a small computer that sends electrical signals to the heart. This helps the heart beat normally. Some pacemakers keep information about heart rhythms. You may need this procedure if you have: A slow heartbeat (bradycardia). Repeated fainting (syncope), or you often have dizziness or light-headedness because of an irregular heart rate. A weak heart that is not pumping enough blood to your body. The pacemaker can help your heart chambers beat in sync. The pacemaker attaches to your heart with a wire called a lead. One or two leads may be needed. There are different types of pacemakers: Transvenous pacemaker. This type is placed under the skin or muscle of  your upper chest. The lead goes through a vein in the chest to the inside of the heart. Epicardial pacemaker. This type is placed under the skin or muscle of your chest or belly. The lead goes through your chest to the outside of the heart. Tell a health care provider about: Any allergies you have. All medicines you are taking, including vitamins, herbs, eye drops, creams, and over-the-counter medicines. Any problems you or family members have had with anesthesia. Any bleeding problems or bone disorders you have. Any surgeries you have had. Any medical conditions you have. Whether you are pregnant or may be pregnant. What are the risks? Your health care provider will talk with you about risks. These may include: Infection. Bleeding. Failure of the pacemaker or lead. A collapsed lung or bleeding into a lung. A blood clot inside a blood vessel with a lead. Heart damage. Infection inside the heart (endocarditis). Allergic reactions to medicines. What happens before the procedure? When to stop eating and drinking Follow instructions from your provider about what you may eat and drink. These may include: 8 hours before your procedure Stop eating most foods. Do not eat meat, fried foods, or fatty foods. Eat only light foods, such as toast or crackers. All liquids are okay except energy drinks and alcohol. 6 hours before your procedure Stop eating. Drink only clear liquids, such as water, clear fruit juice, black coffee, plain tea, and sports drinks. Do not drink energy drinks or alcohol. 2 hours before your procedure Stop drinking all liquids. You may be allowed to take  medicines with small sips of water. If you do not follow your provider's instructions, your procedure may be delayed or canceled. Medicines Ask your provider about: Changing or stopping your regular medicines. These include any diabetes medicines or blood thinners you take. Taking medicines such as aspirin and  ibuprofen. These medicines can thin your blood. Do not take them unless your provider tells you to. Taking over-the-counter medicines, vitamins, herbs, and supplements. Tests You may have: A heart test. This may include: An electrocardiogram (ECG). Patches will be put on your skin to check your heart rhythm. A chest X-ray. An echocardiogram. Sound waves (ultrasound) are used to get an image of the heart. A cardiac rhythm monitor. This records your heart rhythm and any events for a longer period of time. Blood tests. Genetic testing. General instructions Do not use any products that contain nicotine or tobacco. These products include cigarettes, chewing tobacco, and vaping devices, such as e-cigarettes. If you need help quitting, ask your provider. Ask your provider: How your surgery site will be marked. What steps will be taken to help prevent infection. These steps may include: Removing hair at the surgery site. Washing skin with a soap that kills germs. Receiving antibiotics. If you will be going home right after the procedure, plan to have a responsible adult: Take you home from the hospital or clinic. You will not be allowed to drive. Care for you for the time you are told. What happens during the procedure? An IV will be inserted into one of your veins. You may be given: A sedative. This helps you relax. Anesthesia. This keeps you from feeling pain. It will make you fall asleep for surgery. The next steps vary depending on which pacemaker you will be getting. If you are getting a transvenous pacemaker: An incision will be made in your upper chest. A pocket will be made for the pacemaker. It may be placed under the skin or between layers of muscle. The lead will be put into a blood vessel that goes to the heart. While X-rays are taken by an imaging machine (fluoroscopy), the lead will be moved through the vein to the inside of your heart. The other end of the lead will be put  under the skin and attached to the pacemaker. If you are getting an epicardial pacemaker: An incision will be made near your ribs or breastbone (sternum) for the lead. The lead will be attached to the outside of your heart. Another incision will be made in your chest or upper belly to make a pocket for the pacemaker. The free end of the lead will be moved under the skin and attached to the pacemaker. The pacemaker will be tested. Pictures may be taken to check the lead position. The incisions will be closed with stitches (sutures), tape strips, or skin glue. Bandages (dressings) will be placed over the incisions. The procedure may vary among providers and hospitals. What happens after the procedure? Your blood pressure, heart rate, breathing rate, and blood oxygen level will be monitored until you leave the hospital or clinic. You may be given antibiotics. You will be given pain medicine. An ECG and chest X-rays will be done. Your provider will program the pacemaker. If you were given a sedative during the procedure, it can affect you for several hours. Do not drive or operate machinery until your provider says that it is safe. You will be given a pacemaker identification card. This card lists the implant date, device model, and manufacturer  of your pacemaker. This information is not intended to replace advice given to you by your health care provider. Make sure you discuss any questions you have with your health care provider. Document Revised: 08/25/2022 Document Reviewed: 08/25/2022 Elsevier Patient Education  2024 [employer].

## 2024-03-31 DIAGNOSIS — R1032 Left lower quadrant pain: Secondary | ICD-10-CM | POA: Diagnosis not present

## 2024-03-31 DIAGNOSIS — R634 Abnormal weight loss: Secondary | ICD-10-CM | POA: Diagnosis not present

## 2024-03-31 DIAGNOSIS — R1031 Right lower quadrant pain: Secondary | ICD-10-CM | POA: Diagnosis not present

## 2024-04-21 ENCOUNTER — Encounter (HOSPITAL_COMMUNITY): Payer: Self-pay | Admitting: Gastroenterology

## 2024-04-21 NOTE — Progress Notes (Signed)
 Steven [name]  Dolan Pines MD Cardiologist-Parker MD Pulmonologist- n/a  EKG-03/28/24 Echo-10/22/22 Cath-n/a Stress-n/a ICD/PM-PM GLP1-n/a Blood Thinner- n/a   HX:OSA, Down Syndrome, Slow to wake with anesthesia, Bradycardia, Severe sinus node dysfunction/Junctional rhythm. Patient last saw cardiologist 03/28/24 at that appt did discuss he would be a good candidate for Pacemaker, family wanted some time to think over. Per father, still thinking about this, I asked if having symptoms, he said just gets winded easily, no equipment use.  Anesthesia Review- Yes- needs cardiac clearance, lvm for Dr.Hungs nurse 6/23.

## 2024-04-22 ENCOUNTER — Telehealth: Payer: Self-pay

## 2024-04-22 NOTE — Telephone Encounter (Signed)
   Pre-operative Risk Assessment    Patient Name: Steven [name]  DOB: Mar 22, 1966 MRN: [id number]   Date of last office visit: 03/28/24 FONDA KITTY, MD Date of next office visit: NONE   Request for Surgical Clearance    Procedure:  COLONOSCOPY  Date of Surgery:  Clearance 04/25/24                                Surgeon:  DR ROLLIN Surgeon's Group or Practice Name:  [facility], [place] Phone number:  [phone] Fax number:  [phone]   Type of Clearance Requested:   - Medical    Type of Anesthesia:  PROPOFOL    Additional requests/questions:    Signed, Lucie DELENA Ku   04/22/2024, 10:09 AM

## 2024-04-22 NOTE — Telephone Encounter (Signed)
 Dr. Kennyth,  Mr. Orantes is requesting preoperative cardiac evaluation for colonoscopy.  Procedure is scheduled for 04/25/2024.  He was recently seen by you in clinic on 03/28/2024.  His fatigue and progressive shortness of breath were discussed.  His cardiac event monitor previously showed frequent pauses and episodes of bradycardia with a predominant junctional bradycardic rhythm.  It was felt that he would meet criteria for PPM.  Would you be able to comment on cardiac risk for upcoming procedure?  Thank you for your help.  Please direct your response to CV DIV preop pool.  Josefa HERO. Aleena Kirkeby NP-C     04/22/2024, 10:27 AM [facility] [address] Office [phone] Fax [phone]

## 2024-04-23 NOTE — Telephone Encounter (Signed)
   Patient Name: Steven [name]  DOB: 01/01/1966 MRN: [id number]  Primary Cardiologist: None  Chart reviewed as part of pre-operative protocol coverage. Given past medical history and time since last visit, based on ACC/AHA guidelines, Kamerin Grumbine is at acceptable risk for the planned procedure without further cardiovascular testing.   Case discussed with Dr. Kennyth, patient has chronic bradycardia, will likely be bradycardic during the procedure.  However this is chronic finding.  Dr. Kennyth has previously talked to patient at the family regarding possibility of pacemaker, they did not wish to pursue pacemaker implantation which was felt to be reasonable.   I will route this recommendation to the requesting party via Epic fax function and remove from pre-op pool.  Please call with questions.  Blakeley Margraf, [place] 04/23/2024, 2:28 PM

## 2024-04-23 NOTE — Telephone Encounter (Signed)
 Elenor Lee, RN sent me secure if pt has been cleared: Hi there, I'm with pre op at [facility] was checking on this pts clearance for Fridays procedure, will be starting on colon prep tmrw so making sure okay to still have procedure. Looks like you sent Dr Kennyth a note yest am and I dont see any notes after.   Josefa Beauvais, FNP reached out to Dr. Kennyth who recently saw the pt and asked if he was able to clear the pt.   I will send to our preop APP today for review.

## 2024-04-25 ENCOUNTER — Ambulatory Visit (HOSPITAL_COMMUNITY)
Admission: RE | Admit: 2024-04-25 | Discharge: 2024-04-25 | Disposition: A | Discharge status: Home / Self Care | Attending: Gastroenterology | Admitting: Gastroenterology

## 2024-04-25 ENCOUNTER — Encounter (HOSPITAL_COMMUNITY)
Admission: RE | Disposition: A | Discharge status: Home / Self Care | Payer: Self-pay | Source: Home / Self Care | Attending: Gastroenterology

## 2024-04-25 ENCOUNTER — Other Ambulatory Visit: Payer: Self-pay

## 2024-04-25 ENCOUNTER — Ambulatory Visit (HOSPITAL_COMMUNITY): Payer: Self-pay | Admitting: Anesthesiology

## 2024-04-25 ENCOUNTER — Ambulatory Visit (HOSPITAL_BASED_OUTPATIENT_CLINIC_OR_DEPARTMENT_OTHER): Payer: Self-pay | Admitting: Anesthesiology

## 2024-04-25 ENCOUNTER — Encounter (HOSPITAL_COMMUNITY): Payer: Self-pay | Admitting: Gastroenterology

## 2024-04-25 DIAGNOSIS — R1032 Left lower quadrant pain: Secondary | ICD-10-CM

## 2024-04-25 DIAGNOSIS — R634 Abnormal weight loss: Secondary | ICD-10-CM | POA: Diagnosis not present

## 2024-04-25 DIAGNOSIS — R152 Fecal urgency: Secondary | ICD-10-CM | POA: Diagnosis not present

## 2024-04-25 DIAGNOSIS — Z6831 Body mass index (BMI) 31.0-31.9, adult: Secondary | ICD-10-CM

## 2024-04-25 DIAGNOSIS — E66813 Obesity, class 3: Secondary | ICD-10-CM | POA: Diagnosis not present

## 2024-04-25 DIAGNOSIS — Z833 Family history of diabetes mellitus: Secondary | ICD-10-CM | POA: Diagnosis not present

## 2024-04-25 DIAGNOSIS — F79 Unspecified intellectual disabilities: Secondary | ICD-10-CM | POA: Insufficient documentation

## 2024-04-25 DIAGNOSIS — R1031 Right lower quadrant pain: Secondary | ICD-10-CM | POA: Diagnosis not present

## 2024-04-25 DIAGNOSIS — G4730 Sleep apnea, unspecified: Secondary | ICD-10-CM | POA: Insufficient documentation

## 2024-04-25 DIAGNOSIS — Q909 Down syndrome, unspecified: Secondary | ICD-10-CM | POA: Insufficient documentation

## 2024-04-25 HISTORY — DX: Sleep apnea, unspecified: G47.30

## 2024-04-25 HISTORY — PX: COLONOSCOPY: SHX5424

## 2024-04-25 SURGERY — COLONOSCOPY
Anesthesia: Monitor Anesthesia Care

## 2024-04-25 MED ORDER — SODIUM CHLORIDE 0.9 % IV SOLN: INTRAVENOUS | Status: DC

## 2024-04-25 MED ORDER — SODIUM CHLORIDE 0.9 % IV SOLN: INTRAVENOUS | Status: DC | PRN

## 2024-04-25 MED ORDER — PROPOFOL 10 MG/ML IV BOLUS
INTRAVENOUS | Status: DC | PRN
Start: 2024-04-25 — End: 2024-04-25
  Administered 2024-04-25: 50 mg via INTRAVENOUS

## 2024-04-25 MED ORDER — LIDOCAINE HCL (CARDIAC) PF 100 MG/5ML IV SOSY
PREFILLED_SYRINGE | INTRAVENOUS | Status: DC | PRN
  Administered 2024-04-25: 100 mg via INTRAVENOUS

## 2024-04-25 MED ORDER — PROPOFOL 500 MG/50ML IV EMUL
INTRAVENOUS | Status: DC | PRN
  Administered 2024-04-25: 100 ug/kg/min via INTRAVENOUS

## 2024-04-25 MED ORDER — SODIUM CHLORIDE 0.9 % IV SOLN
INTRAVENOUS | Status: AC | PRN
  Administered 2024-04-25: 500 mL via INTRAVENOUS

## 2024-04-25 NOTE — Op Note (Signed)
 [facility] Patient Name: Steven [name] Procedure Date: 04/25/2024 MRN: [id number] Attending MD: Belvie Just , MD, [id number] Date of Birth: 01-22-1966 CSN: [id number] Age: 58 Admit Type: Ambulatory Procedure:                Colonoscopy Indications:              Abdominal pain in the left lower quadrant,                            Abdominal pain in the right lower quadrant, Weight                            loss Providers:                Belvie Just, MD, Ozell Pouch, Fairy [place], Technician Referring MD:              Medicines:                Propofol  per Anesthesia Complications:            No immediate complications. Estimated Blood Loss:     Estimated blood loss: none. Procedure:                Pre-Anesthesia Assessment:                           - Prior to the procedure, a History and Physical                            was performed, and patient medications and                            allergies were reviewed. The patient's tolerance of                            previous anesthesia was also reviewed. The risks                            and benefits of the procedure and the sedation                            options and risks were discussed with the patient.                            All questions were answered, and informed consent                            was obtained. Prior Anticoagulants: The patient has                            taken no anticoagulant or antiplatelet agents. ASA                            Grade Assessment: III -  A patient with severe                            systemic disease. After reviewing the risks and                            benefits, the patient was deemed in satisfactory                            condition to undergo the procedure.                           - Sedation was administered by an anesthesia                            professional. Deep sedation was attained.                            After obtaining informed consent, the colonoscope                            was passed under direct vision. Throughout the                            procedure, the patient's blood pressure, pulse, and                            oxygen saturations were monitored continuously. The                            CF-HQ190L ([id number]) Olympus colonoscope was                            introduced through the anus and advanced to the the                            cecum, identified by appendiceal orifice and                            ileocecal valve. The colonoscopy was performed                            without difficulty. The patient tolerated the                            procedure well. The quality of the bowel                            preparation was evaluated using the BBPS [facility]                            Bowel Preparation Scale) with scores of: Right                            Colon =  3, Transverse Colon = 3 and Left Colon = 3                            (entire mucosa seen well with no residual staining,                            small fragments of stool or opaque liquid). The                            total BBPS score equals 9. The ileocecal valve,                            appendiceal orifice, and rectum were photographed. Scope In: 11:50:45 AM Scope Out: 11:59:39 AM Scope Withdrawal Time: 0 hours 5 minutes 32 seconds  Total Procedure Duration: 0 hours 8 minutes 54 seconds  Findings:      The entire examined colon appeared normal. Impression:               - The entire examined colon is normal.                           - No specimens collected. Moderate Sedation:      Not Applicable - Patient had care per Anesthesia. Recommendation:           - Patient has a contact number available for                            emergencies. The signs and symptoms of potential                            delayed complications were discussed with the                             patient. Return to normal activities tomorrow.                            Written discharge instructions were provided to the                            patient.                           - Resume regular diet.                           - Continue present medications.                           - Repeat colonoscopy in 10 years for screening                            purposes.                           - Trial of Viberzi 100 mg BID.                           -  Trial of Voquezna 10 mg QD.                           - Follow up in the office in one month. Procedure Code(s):        --- Professional ---                           [phone], Colonoscopy, flexible; diagnostic, including                            collection of specimen(s) by brushing or washing,                            when performed (separate procedure) Diagnosis Code(s):        --- Professional ---                           R10.32, Left lower quadrant pain                           R10.31, Right lower quadrant pain                           R63.4, Abnormal weight loss CPT copyright 2022 American Medical Association. All rights reserved. The codes documented in this report are preliminary and upon coder review may  be revised to meet current compliance requirements. Belvie Just, MD Belvie Just, MD 04/25/2024 12:05:42 PM This report has been signed electronically. Number of Addenda: 0

## 2024-04-25 NOTE — Transfer of Care (Signed)
 Immediate Anesthesia Transfer of Care Note  Patient: Steven [name]  Procedure(s) Performed: COLONOSCOPY  Patient Location: PACU  Anesthesia Type:MAC  Level of Consciousness: awake, alert , and oriented  Airway & Oxygen Therapy: Patient Spontanous Breathing and Patient connected to nasal cannula oxygen  Post-op Assessment: Report given to RN and Post -op Vital signs reviewed and stable  Post vital signs: Reviewed and stable  Last Vitals:  Vitals Value Taken Time  BP 86/45 04/25/24 12:06  Temp 36 1206  Pulse 50 04/25/24 12:07  Resp 14 04/25/24 12:07  SpO2 100 % 04/25/24 12:07  Vitals shown include unfiled device data.  Last Pain:  Vitals:   04/25/24 0848  TempSrc: Temporal         Complications: No notable events documented.

## 2024-04-25 NOTE — Discharge Instructions (Signed)

## 2024-04-25 NOTE — H&P (Signed)
 Steven [name] HPI: The patient complains about abdominal cramping with fecal urgency.  On average he has these fecal urges every 2 hours, but he only averages two small formed bowel movements per day.  Typically he does not wake up at night with the fecal urgency.  Dicyclomine  does seem to be helping a little bit per his father.  Since December of last year there is a noted 10 lbs weight loss.  He is very sensitive to spicey foods and he has a sensation of early satiety.  Past Medical History:  Diagnosis Date   Complication of anesthesia    per patient father, patient was slow to wake with a previous surgery    Down syndrome    Incisional hernia    MR (mental retardation)    Sleep apnea     Past Surgical History:  Procedure Laterality Date   BIOPSY  12/07/2023   Procedure: BIOPSY;  Surgeon: Rollin Dover, MD;  Location: [facility];  Service: Gastroenterology;;   ESOPHAGOGASTRODUODENOSCOPY (EGD) WITH PROPOFOL  N/A 12/07/2023   Procedure: ESOPHAGOGASTRODUODENOSCOPY (EGD) WITH PROPOFOL ;  Surgeon: Rollin Dover, MD;  Location: [facility];  Service: Gastroenterology;  Laterality: N/A;   HERNIA REPAIR     unaware of the year of , maybe 10 years ago    INCISIONAL HERNIA REPAIR N/A 07/17/2017   Procedure: LAPAROSCOPIC INCISIONAL HERNIA WITH MESH;  Surgeon: Vernetta Berg, MD;  Location: [facility];  Service: General;  Laterality: N/A;   INSERTION OF MESH N/A 07/17/2017   Procedure: INSERTION OF MESH;  Surgeon: Vernetta Berg, MD;  Location: [facility];  Service: General;  Laterality: N/A;    Family History  Problem Relation Age of Onset   Diabetes Father    Hypertension Neg Hx    Heart disease Neg Hx    Cancer Neg Hx     Social History:  reports that he has never smoked. He has never used smokeless tobacco. He reports that he does not currently use alcohol. He reports that he does not use drugs.  Allergies: No Known Allergies  Medications: Scheduled: Continuous:  sodium chloride  500 mL  (04/25/24 0852)    No results found for this or any previous visit (from the past 24 hours).   No results found.  ROS:  As stated above in the HPI otherwise negative.  Blood pressure 108/73, pulse (!) 43, temperature (!) 97.5 F (36.4 C), temperature source Temporal, resp. rate 10, height 5' 1 (1.549 m), weight 75.8 kg, SpO2 100%.    PE: Gen: NAD, Alert and Oriented HEENT:  [place]/AT, EOMI Neck: Supple, no LAD Lungs: CTA Bilaterally CV: RRR without M/G/R ABD: Soft, NTND, +BS Ext: No C/C/E  Assessment/Plan: 1) Lower abdominal pain. 2) Weight loss.  Plan: 1) Colonoscopy.  Herold Salguero D 04/25/2024, 9:01 AM

## 2024-04-25 NOTE — Anesthesia Postprocedure Evaluation (Signed)
 Anesthesia Post Note  Patient: Steven [name]  Procedure(s) Performed: COLONOSCOPY     Patient location during evaluation: PACU Anesthesia Type: MAC Level of consciousness: awake and alert Pain management: pain level controlled Vital Signs Assessment: post-procedure vital signs reviewed and stable Respiratory status: spontaneous breathing, nonlabored ventilation, respiratory function stable and patient connected to nasal cannula oxygen Cardiovascular status: stable and blood pressure returned to baseline Postop Assessment: no apparent nausea or vomiting Anesthetic complications: no   There were no known notable events for this encounter.  Last Vitals:  Vitals:   04/25/24 1230 04/25/24 1235  BP: 96/62 106/77  Pulse: (!) 37 (!) 40  Resp: 11 15  Temp:    SpO2: 97% 98%    Last Pain:  Vitals:   04/25/24 1235  TempSrc:   PainSc: 0-No pain                 Nairi Oswald

## 2024-04-25 NOTE — Anesthesia Preprocedure Evaluation (Addendum)
 Anesthesia Evaluation  Patient identified by MRN, date of birth, ID band Patient awake    Reviewed: Allergy & Precautions, H&P , NPO status , Patient's Chart, lab work & pertinent test results  History of Anesthesia Complications (+) history of anesthetic complications  Airway Mallampati: III  TM Distance: >3 FB Neck ROM: Full    Dental no notable dental hx. (+) Poor Dentition, Dental Advisory Given   Pulmonary neg pulmonary ROS, sleep apnea    Pulmonary exam normal breath sounds clear to auscultation       Cardiovascular Exercise Tolerance: Good negative cardio ROS Normal cardiovascular exam Rhythm:Regular Rate:Normal     Neuro/Psych negative neurological ROS  negative psych ROS   GI/Hepatic negative GI ROS, Neg liver ROS,,,  Endo/Other  negative endocrine ROS  Class 3 obesity  Renal/GU negative Renal ROS  negative genitourinary   Musculoskeletal negative musculoskeletal ROS (+)    Abdominal   Peds negative pediatric ROS (+)  Hematology negative hematology ROS (+)   Anesthesia Other Findings   Reproductive/Obstetrics negative OB ROS                             Anesthesia Physical Anesthesia Plan  ASA: 2  Anesthesia Plan: MAC   Post-op Pain Management: Minimal or no pain anticipated   Induction: Intravenous  PONV Risk Score and Plan: 1 and Propofol  infusion  Airway Management Planned: Mask, Natural Airway and Simple Face Mask  Additional Equipment:   Intra-op Plan:   Post-operative Plan:   Informed Consent: I have reviewed the patients History and Physical, chart, labs and discussed the procedure including the risks, benefits and alternatives for the proposed anesthesia with the patient or authorized representative who has indicated his/her understanding and acceptance.       Plan Discussed with: Anesthesiologist  Anesthesia Plan Comments:        Anesthesia  Quick Evaluation

## 2024-04-28 ENCOUNTER — Encounter (HOSPITAL_COMMUNITY): Payer: Self-pay | Admitting: Gastroenterology

## 2024-05-29 ENCOUNTER — Other Ambulatory Visit: Payer: Self-pay | Admitting: Family Medicine

## 2024-05-29 DIAGNOSIS — R109 Unspecified abdominal pain: Secondary | ICD-10-CM

## 2024-06-02 DIAGNOSIS — R1031 Right lower quadrant pain: Secondary | ICD-10-CM | POA: Diagnosis not present

## 2024-06-02 DIAGNOSIS — K5904 Chronic idiopathic constipation: Secondary | ICD-10-CM | POA: Diagnosis not present

## 2024-06-02 DIAGNOSIS — R1032 Left lower quadrant pain: Secondary | ICD-10-CM | POA: Diagnosis not present

## 2024-06-18 DIAGNOSIS — H0264 Xanthelasma of left upper eyelid: Secondary | ICD-10-CM | POA: Diagnosis not present

## 2024-06-18 DIAGNOSIS — H0261 Xanthelasma of right upper eyelid: Secondary | ICD-10-CM | POA: Diagnosis not present

## 2024-06-18 DIAGNOSIS — H0102B Squamous blepharitis left eye, upper and lower eyelids: Secondary | ICD-10-CM | POA: Diagnosis not present

## 2024-06-18 DIAGNOSIS — H34831 Tributary (branch) retinal vein occlusion, right eye, with macular edema: Secondary | ICD-10-CM | POA: Diagnosis not present

## 2024-07-02 ENCOUNTER — Ambulatory Visit: Payer: Self-pay | Admitting: Family Medicine

## 2024-07-02 ENCOUNTER — Ambulatory Visit (INDEPENDENT_AMBULATORY_CARE_PROVIDER_SITE_OTHER): Admitting: Family Medicine

## 2024-07-02 ENCOUNTER — Ambulatory Visit (INDEPENDENT_AMBULATORY_CARE_PROVIDER_SITE_OTHER)
Admission: RE | Admit: 2024-07-02 | Discharge: 2024-07-02 | Disposition: A | Discharge status: Home / Self Care | Source: Ambulatory Visit | Attending: Family Medicine | Admitting: Family Medicine

## 2024-07-02 ENCOUNTER — Ambulatory Visit: Payer: Self-pay

## 2024-07-02 VITALS — BP 108/60 | HR 41 | Temp 98.0°F | Resp 15 | Ht 61.0 in | Wt 159.2 lb

## 2024-07-02 DIAGNOSIS — R109 Unspecified abdominal pain: Secondary | ICD-10-CM | POA: Diagnosis not present

## 2024-07-02 DIAGNOSIS — K5900 Constipation, unspecified: Secondary | ICD-10-CM

## 2024-07-02 NOTE — Telephone Encounter (Signed)
 FYI Only or Action Required?: FYI only for provider.  Patient was last seen in primary care on 03/10/2024 by Levora Reyes SAUNDERS, MD.  Called Nurse Triage reporting Abdominal Pain.  Symptoms began several days ago.  Interventions attempted: OTC medications: Miralax .  Symptoms are: gradually worsening.  Triage Disposition: See Physician Within 24 Hours  Patient/caregiver understands and will follow disposition?: Yes   Copied from CRM #[id number]. Topic: Clinical - Red Word Triage >> Jul 02, 2024  8:52 AM Macario HERO wrote: Red Word that prompted transfer to Nurse Triage: Patient father called said patient has been experiencing stomach pain/cramps and inability to go to the restroom. Reason for Disposition  [1] MODERATE pain (e.g., interferes with normal activities) AND [2] pain comes and goes (cramps) AND [3] present > 24 hours  (Exception: Pain with Vomiting or Diarrhea - see that Guideline.)  Answer Assessment - Initial Assessment Questions 1. LOCATION: Where does it hurt?      Umbilical Area  2. RADIATION: Does the pain shoot anywhere else? (e.g., chest, back)     Unable to determine  3. ONSET: When did the pain begin? (Minutes, hours or days ago)      3 Days ago  4. SUDDEN: Gradual or sudden onset?     Gradual  5. PATTERN Does the pain come and go, or is it constant?     Intermittent  6. SEVERITY: How bad is the pain?  (e.g., Scale 1-10; mild, moderate, or severe)      Moderate  7. RECURRENT SYMPTOM: Have you ever had this type of stomach pain before? If Yes, ask: When was the last time? and What happened that time?      Yes  8. CAUSE: What do you think is causing the stomach pain? (e.g., gallstones, recent abdominal surgery)     Unsure  9. RELIEVING/AGGRAVATING FACTORS: What makes it better or worse? (e.g., antacids, bending or twisting motion, bowel movement)     Unable to determine, patient is nonverbal  10. OTHER SYMPTOMS: Do you have any other  symptoms? (e.g., back pain, diarrhea, fever, urination pain, vomiting)       Constipation  Protocols used: Abdominal Pain - Male-A-AH

## 2024-07-02 NOTE — Patient Instructions (Addendum)
 I do think that the abdominal cramping is most likely related to constipation again.  Please have 1 view x-ray at the [facility] location below.  I will watch for those results today and then can discuss other treatment options but for now can increase MiraLAX  to twice per day.  Make sure to drink plenty of fluids.  Will let you know other meds once I review x-ray and look into this further.  I do recommend follow-up with gastroenterology to decide if other daily medication for constipation may be helpful. Return to the clinic or go to the nearest emergency room if any of your symptoms worsen or new symptoms occur.   [facility] or xray: Walk in 8:30-4:30 during weekdays, no appointment needed [address]  [place], [place] [address]

## 2024-07-02 NOTE — Progress Notes (Unsigned)
 Subjective:  Patient ID: Steven [name], male    DOB: 1966-06-11  Age: 58 y.o. MRN: [id number]  CC:  Chief Complaint  Patient presents with   Abdominal Pain    Pt has had long term issues with constipation, notes he grunts more and says it hurts otherwise no change in qualities or sxs.     HPI Steven [name] presents for   Constipation, abdominal pain Recurrent issues with constipation.  We did discuss abdominal pain in May. Prior CT imagingm as well as right upper quadrant ultrasound without acute findings.  Has met with gastroenterology, Dr. Cloteal previously.  As of his May visit was using Citrucel once per day, still with cramping at a time, usually after meals, had not use Bentyl .  Of note he had been on an alpha-blocker with Flomax from urology due to frequent urination, possibly contributing to his constipation.  We decided to stay on the Citrucel and MiraLAX  to help with the bowel regimen, option of low-dose dicyclomine  twice per day to help with cramping if needed but to watch to make sure that was not worsening constipation.  Also recommended follow-up with gastroenterology at that time.  Similar symptoms today. Some cramping in am, sometimes at night or after meals.  No n/v/fever. Grunting with abdominal pain. No cough.  Last BM few days ago? Unknown. Prior BMs once per day. Small stool prior.  Saw GI since last visit - change in diet recommended. More fiber, less junk food. Have been making changes.  Citrucel supplement daily.  Miralax  once daily chronically.  No BRBPR per parents.  Increased straining for BM.  On dicyclomine  once per day for cramping. On flomax daily.   Eating/drinking, slight decreased food with symptoms past few days.   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   Incarcerated incisional hernia 07/17/2017   Past Medical History:  Diagnosis Date   Complication of anesthesia    per patient father, patient was slow to wake with a previous surgery    Down syndrome    Incisional hernia    MR (mental retardation)    Sleep apnea    Past Surgical History:  Procedure Laterality Date   BIOPSY  12/07/2023   Procedure: BIOPSY;  Surgeon: Rollin Dover, MD;  Location: [facility];  Service: Gastroenterology;;   COLONOSCOPY N/A 04/25/2024   Procedure: COLONOSCOPY;  Surgeon: Rollin Dover, MD;  Location: [facility];  Service: Gastroenterology;  Laterality: N/A;   ESOPHAGOGASTRODUODENOSCOPY (EGD) WITH PROPOFOL  N/A 12/07/2023   Procedure: ESOPHAGOGASTRODUODENOSCOPY (EGD) WITH PROPOFOL ;  Surgeon: Rollin Dover, MD;  Location: [facility];  Service: Gastroenterology;  Laterality: N/A;   HERNIA REPAIR     unaware of the year of , maybe 10 years ago    INCISIONAL HERNIA REPAIR N/A 07/17/2017   Procedure: LAPAROSCOPIC INCISIONAL HERNIA WITH MESH;  Surgeon: Vernetta Berg, MD;  Location: [facility];  Service: General;  Laterality: N/A;   INSERTION OF MESH N/A 07/17/2017   Procedure: INSERTION OF MESH;  Surgeon: Vernetta Berg, MD;  Location: [facility];  Service: General;  Laterality: N/A;   No Known Allergies Prior to Admission medications   Medication Sig Start Date End Date Taking? Authorizing Provider  dicyclomine  (BENTYL ) 10 MG capsule Take 1 capsule (  10 mg total) by mouth in the morning and at bedtime. 05/30/24  Yes Levora Reyes SAUNDERS, MD  tamsulosin (FLOMAX) 0.4 MG CAPS capsule Take 0.4 mg by mouth daily after breakfast.   Yes [provider]  alum & mag hydroxide-simeth (MAALOX MAX) 400-400-40 MG/5ML suspension Take 5 mLs by mouth every 6 (six) hours as needed for indigestion. Patient not taking: Reported on 07/02/2024 07/14/23   Christopher Savannah, PA-C   amoxicillin -clavulanate (AUGMENTIN ) 875-125 MG tablet Take 1 tablet by mouth every 12 (twelve) hours. Patient not taking: Reported on 07/02/2024 01/28/24   Dean Clarity, MD  Bacillus Coagulans-Inulin (ALIGN PREBIOTIC-PROBIOTIC) 5-1.25 MG-GM CHEW Chew 1 capsule by mouth daily. Patient not taking: Reported on 07/02/2024 01/28/24   Haviland, Julie, MD  Cholecalciferol (VITAMIN D  PO) Take 5,000 Units by mouth daily.  Patient not taking: Reported on 07/02/2024    [provider]  famotidine  (PEPCID ) 20 MG tablet Take 1 tablet (20 mg total) by mouth 2 times daily. Patient not taking: Reported on 07/02/2024 10/17/23   Levora Reyes SAUNDERS, MD  fluticasone  (FLONASE ) 50 MCG/ACT nasal spray Place 1 spray into both nostrils daily. Patient not taking: Reported on 07/02/2024 09/10/23   Levora Reyes SAUNDERS, MD  Multiple Vitamin (MULTIVITAMIN WITH MINERALS) TABS tablet Take 1 tablet by mouth daily. Patient not taking: Reported on 07/02/2024    [provider]  Omega-3 Fatty Acids (OMEGA-3 CF PO) Take 1,600 mg by mouth daily. Patient not taking: Reported on 07/02/2024    [provider]  omeprazole  (PRILOSEC) 20 MG capsule Take 1 capsule (20 mg total) by mouth daily. Patient not taking: Reported on 07/02/2024 08/27/23   Levora Reyes SAUNDERS, MD  ondansetron  (ZOFRAN -ODT) 4 MG disintegrating tablet Take 1 tablet (4 mg total) by mouth every 8 (eight) hours as needed. Patient not taking: Reported on 07/02/2024 01/28/24   Dean Clarity, MD  ondansetron  (ZOFRAN -ODT) 8 MG disintegrating tablet 8mg  ODT q8 hours prn nausea Patient not taking: Reported on 07/02/2024 03/04/24   Palumbo, April, MD  triamcinolone  cream (KENALOG ) 0.1 % Apply 1 Application topically 2 (two) times daily. Patient not taking: Reported on 07/02/2024 04/20/23   Levora Reyes SAUNDERS, MD   Social History   Socioeconomic History   Marital status: Single    Spouse name: Not on file   Number of children: Not on file   Years of education: Not on file    Highest education level: Not on file  Occupational History   Not on file  Tobacco Use   Smoking status: Never   Smokeless tobacco: Never  Vaping Use   Vaping status: Never Used  Substance and Sexual Activity   Alcohol use: Not Currently    Comment: occ   Drug use: No   Sexual activity: Not on file  Other Topics Concern   Not on file  Social History Narrative   Not on file   Social Drivers of Health   Financial Resource Strain: Not on file  Food Insecurity: Not on file  Transportation Needs: Not on file  Physical Activity: Not on file  Stress: Not on file  Social Connections: Not on file  Intimate Partner Violence: Not on file    Review of Systems Per HPI  Objective:   Vitals:   07/02/24 1027  BP: 108/60  Pulse: (!) 41  Resp: 15  Temp: 98 F (36.7 C)  TempSrc: Temporal  SpO2: 97%  Weight: 159 lb 3.2 oz (72.2 kg)  Height: 5' 1 (1.549 m)  Physical Exam Vitals reviewed.  Constitutional:      General: He is not in acute distress.    Appearance: He is well-developed. He is not ill-appearing, toxic-appearing or diaphoretic.  HENT:     Head: Normocephalic and atraumatic.  Neck:     Vascular: No carotid bruit or JVD.  Cardiovascular:     Rate and Rhythm: Normal rate and regular rhythm.     Heart sounds: Normal heart sounds. No murmur heard. Pulmonary:     Effort: Pulmonary effort is normal.     Breath sounds: Normal breath sounds. No rales.  Musculoskeletal:     Right lower leg: No edema.     Left lower leg: No edema.  Skin:    General: Skin is warm and dry.  Neurological:     Mental Status: He is alert and oriented to person, place, and time.  Psychiatric:        Mood and Affect: Mood normal.     Assessment & Plan:  Steven [name] is a 58 y.o. male . Abdominal cramping - Plan: DG Abd 1 View  Constipation, unspecified constipation type - Plan: DG Abd 1 View Appears to be recurrence of previous constipation, especially with decreased bowel  movements recently, imaging indicating increased stool that would fit with constipation.  Afebrile, no nausea or vomiting, unlikely other cause of abdominal pain but ER precautions were given, RTC precautions.  Recommended he increase the MiraLAX  to twice per day, and note sent via abdominal x-ray report or update through MyChart on symptoms with this change to decide if additional medication needed.  Additionally recommended to follow-up with gastroenterology to see if other daily medication would be indicated to lessen chance of constipation moving forward.  No orders of the defined types were placed in this encounter.  Patient Instructions  I do think that the abdominal cramping is most likely related to constipation again.  Please have 1 view x-ray at the [facility] location below.  I will watch for those results today and then can discuss other treatment options but for now can increase MiraLAX  to twice per day.  Make sure to drink plenty of fluids.  Will let you know other meds once I review x-ray and look into this further.  I do recommend follow-up with gastroenterology to decide if other daily medication for constipation may be helpful. Return to the clinic or go to the nearest emergency room if any of your symptoms worsen or new symptoms occur.   [facility] or xray: Walk in 8:30-4:30 during weekdays, no appointment needed [address]  [place], [place] [address]     Signed,   Reyes Pines, MD [facility], [facility] 07/02/24 11:00 AM

## 2024-07-03 ENCOUNTER — Encounter: Payer: Self-pay | Admitting: Family Medicine

## 2024-07-08 NOTE — Telephone Encounter (Signed)
-----   Message from [name] sent at 07/07/2024  8:22 AM EDT ----- Results sent by MyChart, but appears patient has not yet reviewed those results.  Please call and make sure they have either seen note or discuss result note.  Thanks.    ----- Message ----- From: Mychart, Generic Sent: 07/07/2024  12:15 AM EDT To: [name], MD Subject: Notification of Unviewed Test Results          Steven [name], Steven [name] has not viewed the following results: - DG ABD 1 VIEW

## 2024-07-08 NOTE — Telephone Encounter (Signed)
 Patients father understood results. Will start Miralax  BID and call us  back in a day or two for update.

## 2024-08-25 ENCOUNTER — Emergency Department (HOSPITAL_COMMUNITY): Admission: EM | Admit: 2024-08-25 | Discharge: 2024-08-25 | Disposition: A | Discharge status: Home / Self Care

## 2024-08-25 ENCOUNTER — Other Ambulatory Visit: Payer: Self-pay

## 2024-08-25 ENCOUNTER — Encounter (HOSPITAL_COMMUNITY): Payer: Self-pay

## 2024-08-25 ENCOUNTER — Emergency Department (HOSPITAL_COMMUNITY)

## 2024-08-25 DIAGNOSIS — K5900 Constipation, unspecified: Secondary | ICD-10-CM | POA: Diagnosis not present

## 2024-08-25 DIAGNOSIS — R739 Hyperglycemia, unspecified: Secondary | ICD-10-CM | POA: Diagnosis not present

## 2024-08-25 DIAGNOSIS — R109 Unspecified abdominal pain: Secondary | ICD-10-CM | POA: Diagnosis present

## 2024-08-25 LAB — CBC
HCT: 44.7 % (ref 39.0–52.0)
Hemoglobin: 14.5 g/dL (ref 13.0–17.0)
MCH: 33.1 pg (ref 26.0–34.0)
MCHC: 32.4 g/dL (ref 30.0–36.0)
MCV: 102.1 fL — ABNORMAL HIGH (ref 80.0–100.0)
Platelets: 193 K/uL (ref 150–400)
RBC: 4.38 MIL/uL (ref 4.22–5.81)
RDW: 13.8 % (ref 11.5–15.5)
WBC: 3.3 K/uL — ABNORMAL LOW (ref 4.0–10.5)
nRBC: 0 % (ref 0.0–0.2)

## 2024-08-25 LAB — COMPREHENSIVE METABOLIC PANEL WITH GFR
ALT: 12 U/L (ref 0–44)
AST: 33 U/L (ref 15–41)
Albumin: 3.5 g/dL (ref 3.5–5.0)
Alkaline Phosphatase: 72 U/L (ref 38–126)
Anion gap: 7 (ref 5–15)
BUN: 19 mg/dL (ref 6–20)
CO2: 29 mmol/L (ref 22–32)
Calcium: 9 mg/dL (ref 8.9–10.3)
Chloride: 103 mmol/L (ref 98–111)
Creatinine, Ser: 1.22 mg/dL (ref 0.61–1.24)
GFR, Estimated: >60 mL/min (ref >60)
Glucose, Bld: 66 mg/dL — ABNORMAL LOW (ref 70–99)
Potassium: 4.5 mmol/L (ref 3.5–5.1)
Sodium: 139 mmol/L (ref 135–145)
Total Bilirubin: 0.4 mg/dL (ref 0.0–1.2)
Total Protein: 6.8 g/dL (ref 6.5–8.1)

## 2024-08-25 LAB — CBG MONITORING, ED: Glucose-Capillary: 113 mg/dL — ABNORMAL HIGH (ref 70–99)

## 2024-08-25 LAB — URINALYSIS, ROUTINE W REFLEX MICROSCOPIC
Bilirubin Urine: NEGATIVE
Glucose, UA: 50 mg/dL — AB
Hgb urine dipstick: NEGATIVE
Ketones, ur: 5 mg/dL — AB
Leukocytes,Ua: NEGATIVE
Nitrite: NEGATIVE
Protein, ur: NEGATIVE mg/dL
Specific Gravity, Urine: >1.046 — ABNORMAL HIGH (ref 1.005–1.030)
pH: 6 (ref 5.0–8.0)

## 2024-08-25 LAB — LIPASE, BLOOD: Lipase: 32 U/L (ref 11–51)

## 2024-08-25 MED ORDER — DICYCLOMINE HCL 10 MG PO CAPS: 10.0000 mg | ORAL_CAPSULE | Freq: Three times a day (TID) | ORAL | 0 refills | Status: AC

## 2024-08-25 MED ORDER — SODIUM CHLORIDE (PF) 0.9 % IJ SOLN
INTRAMUSCULAR | Status: AC
Start: 2024-08-25 — End: 2024-08-25
  Filled 2024-08-25: qty 50

## 2024-08-25 MED ORDER — LACTULOSE 20 GM/30ML PO SOLN: 20.0000 g | Freq: Every day | ORAL | 0 refills | Status: AC | PRN

## 2024-08-25 MED ORDER — IOHEXOL 300 MG/ML  SOLN
100.0000 mL | Freq: Once | INTRAMUSCULAR | Status: AC | PRN
  Administered 2024-08-25: 100 mL via INTRAVENOUS

## 2024-08-25 MED ORDER — DEXTROSE 50 % IV SOLN
25.0000 mL | Freq: Once | INTRAVENOUS | Status: AC
  Administered 2024-08-25: 25 mL via INTRAVENOUS
  Filled 2024-08-25: qty 50

## 2024-08-25 MED ORDER — LACTATED RINGERS IV BOLUS
500.0000 mL | Freq: Once | INTRAVENOUS | Status: AC
  Administered 2024-08-25: 500 mL via INTRAVENOUS

## 2024-08-25 NOTE — ED Provider Triage Note (Signed)
 Emergency Medicine Provider Triage Evaluation Note  Steven [name] , a 58 y.o. male  was evaluated in triage.  Pt complains of constipation.  Patient family reports last bowel movement approximately 1 week ago.  Has had no bowel movement since then.  Decreased gas output.  No reported vomiting.  Family reports appetite is still at baseline but he experiences severe periumbilical pain soon after and for several hours.  Review of Systems  Positive: As above Negative: As above  Physical Exam  BP 118/64   Pulse 69   Temp 98.4 F (36.9 C) (Oral)   Resp 19   Ht 5' 1 (1.549 m)   Wt 69.9 kg   SpO2 100%   BMI 29.10 kg/m  Gen:   Awake, no distress   Resp:  Normal effort  MSK:   Moves extremities without difficulty  Other:  Decreased bowel sounds, abdomen is soft and nontender.  Medical Decision Making  Medically screening exam initiated at 2:08 PM.  Appropriate orders placed.  [place] was informed that the remainder of the evaluation will be completed by another provider, this initial triage assessment does not replace that evaluation, and the importance of remaining in the ED until their evaluation is complete.     Marlana Mckowen A, PA-C 08/25/24 1409

## 2024-08-25 NOTE — ED Provider Notes (Signed)
 Steven [name] AT Steven [name] Provider Note   CSN: [id number] Arrival date & time: 08/25/24  1332     Patient presents with: Constipation   Steven [name] is a 58 y.o. male.   58 year old male with past medical history of Down syndrome and chronic abdominal pain/constipation presenting to the emergency [name] today with constipation.  Patient not had a bowel movement the past week.  He has been complaining of some abdominal pain.  Family brought him in today due to this.  The patient has had an endoscopy and colonoscopy within the past few months which were unremarkable.  He is on Citrucel daily.  He was brought to the ER for further evaluation due to these ongoing symptoms.   Constipation      Prior to Admission medications   Medication Sig Start Date End Date Taking? Authorizing Provider  dicyclomine  (BENTYL ) 10 MG capsule Take 1 capsule (10 mg total) by mouth 4 (four) times daily -  before meals and at bedtime. 08/25/24  Yes Ula Prentice SAUNDERS, MD  Lactulose 20 GM/30ML SOLN Take 30 mLs (20 g total) by mouth daily as needed (Constipation). 08/25/24  Yes Ula Prentice SAUNDERS, MD  alum & mag hydroxide-simeth (MAALOX MAX) 400-400-40 MG/5ML suspension Take 5 mLs by mouth every 6 (six) hours as needed for indigestion. Patient not taking: Reported on 07/02/2024 07/14/23   Christopher Savannah, PA-C  amoxicillin -clavulanate (AUGMENTIN ) 875-125 MG tablet Take 1 tablet by mouth every 12 (twelve) hours. Patient not taking: Reported on 07/02/2024 01/28/24   Dean Clarity, MD  Bacillus Coagulans-Inulin (ALIGN PREBIOTIC-PROBIOTIC) 5-1.25 MG-GM CHEW Chew 1 capsule by mouth daily. Patient not taking: Reported on 07/02/2024 01/28/24   Haviland, Julie, MD  Cholecalciferol (VITAMIN D  PO) Take 5,000 Units by mouth daily.  Patient not taking: Reported on 07/02/2024    [provider]  dicyclomine  (BENTYL ) 10 MG capsule Take 1 capsule (10 mg total) by mouth in the morning and at bedtime.  05/30/24   Levora Reyes SAUNDERS, MD  famotidine  (PEPCID ) 20 MG tablet Take 1 tablet (20 mg total) by mouth 2 times daily. Patient not taking: Reported on 07/02/2024 10/17/23   Levora Reyes SAUNDERS, MD  fluticasone  (FLONASE ) 50 MCG/ACT nasal spray Place 1 spray into both nostrils daily. Patient not taking: Reported on 07/02/2024 09/10/23   Levora Reyes SAUNDERS, MD  Multiple Vitamin (MULTIVITAMIN WITH MINERALS) TABS tablet Take 1 tablet by mouth daily. Patient not taking: Reported on 07/02/2024    [provider]  Omega-3 Fatty Acids (OMEGA-3 CF PO) Take 1,600 mg by mouth daily. Patient not taking: Reported on 07/02/2024    [provider]  omeprazole  (PRILOSEC) 20 MG capsule Take 1 capsule (20 mg total) by mouth daily. Patient not taking: Reported on 07/02/2024 08/27/23   Levora Reyes SAUNDERS, MD  ondansetron  (ZOFRAN -ODT) 4 MG disintegrating tablet Take 1 tablet (4 mg total) by mouth every 8 (eight) hours as needed. Patient not taking: Reported on 07/02/2024 01/28/24   Haviland, Julie, MD  ondansetron  (ZOFRAN -ODT) 8 MG disintegrating tablet 8mg  ODT q8 hours prn nausea Patient not taking: Reported on 07/02/2024 03/04/24   Palumbo, April, MD  tamsulosin (FLOMAX) 0.4 MG CAPS capsule Take 0.4 mg by mouth daily after breakfast.    [provider]  triamcinolone  cream (KENALOG ) 0.1 % Apply 1 Application topically 2 (two) times daily. Patient not taking: Reported on 07/02/2024 04/20/23   Levora Reyes SAUNDERS, MD    Allergies: Patient has no known allergies.  Review of Systems  Gastrointestinal:  Positive for constipation.  All other systems reviewed and are negative.   Updated Vital Signs BP (!) 126/101   Pulse (!) 49   Temp 97.8 F (36.6 C) (Oral)   Resp 11   Ht 5' 1 (1.549 m)   Wt 69.9 kg   SpO2 96%   BMI 29.10 kg/m   Physical Exam Vitals and nursing note reviewed.   Gen: NAD Eyes: PERRL, EOMI HEENT: no oropharyngeal swelling Neck: trachea midline Resp: clear to auscultation  bilaterally Card: RRR, no murmurs, rubs, or gallops Abd: nontender, nondistended Extremities: no calf tenderness, no edema Vascular: 2+ radial pulses bilaterally, 2+ DP pulses bilaterally Skin: no rashes Psyc: acting appropriately   (all labs ordered are listed, but only abnormal results are displayed) Labs Reviewed  COMPREHENSIVE METABOLIC PANEL WITH GFR - Abnormal; Notable for the following components:      Result Value   Glucose, Bld 66 (*)    All other components within normal limits  CBC - Abnormal; Notable for the following components:   WBC 3.3 (*)    MCV 102.1 (*)    All other components within normal limits  URINALYSIS, ROUTINE W REFLEX MICROSCOPIC - Abnormal; Notable for the following components:   Color, Urine STRAW (*)    Specific Gravity, Urine >1.046 (*)    Glucose, UA 50 (*)    Ketones, ur 5 (*)    All other components within normal limits  CBG MONITORING, ED - Abnormal; Notable for the following components:   Glucose-Capillary 113 (*)    All other components within normal limits  LIPASE, BLOOD    EKG: EKG Interpretation Date/Time:  Monday August 25 2024 18:41:13 EDT Ventricular Rate:  48 PR Interval:    QRS Duration:  84 QT Interval:  492 QTC Calculation: 381 R Axis:   -18  Text Interpretation: Atrial fibrillation Paired ventricular premature complexes Borderline left axis deviation Low voltage, precordial leads Confirmed by Ula Barter [phone]) on 08/25/2024 8:12:44 PM  Radiology: CT ABDOMEN PELVIS W CONTRAST Result Date: 08/25/2024 CLINICAL DATA:  Abdominal pain and constipation. EXAM: CT ABDOMEN AND PELVIS WITH CONTRAST TECHNIQUE: Multidetector CT imaging of the abdomen and pelvis was performed using the standard protocol following bolus administration of intravenous contrast. RADIATION DOSE REDUCTION: This exam was performed according to the departmental dose-optimization program which includes automated exposure control, adjustment of the mA and/or  kV according to patient size and/or use of iterative reconstruction technique. CONTRAST:  100mL OMNIPAQUE  IOHEXOL  300 MG/ML  SOLN COMPARISON:  Multiple prior imaging studies. The most recent CT scan is 03/04/2024 FINDINGS: Lower chest: The lung bases are clear of acute process. No pleural effusion or pulmonary lesions. The heart is normal in size. No pericardial effusion. The distal esophagus and aorta are unremarkable. Hepatobiliary: No hepatic lesions or intrahepatic biliary dilatation. The gallbladder is mildly contracted. No common bile duct dilatation. The portal and hepatic veins are patent. Pancreas: Normal Spleen: Normal Adrenals/Urinary Tract: Adrenal glands are normal. No renal lesions, renal calculi, hydronephrosis or findings for pyelonephritis. The bladder is moderately thick-walled. Some of this could be due to lack of distension but could not exclude cystitis. Recommend correlation with urinalysis. Stomach/Bowel: The stomach, duodenum, small bowel and colon are unremarkable. No acute inflammatory process, mass lesions or obstructive findings. The terminal ileum is normal. The appendix is not identified I do not see any findings to suggest acute appendicitis. No significant stool burden. Vascular/Lymphatic: The aorta is normal in caliber. No  dissection. The branch vessels are patent. The major venous structures are patent. No mesenteric or retroperitoneal mass or adenopathy. Small scattered lymph nodes are noted. Reproductive: The prostate gland and seminal vesicles are unremarkable. Other: No pelvic mass or adenopathy. No free pelvic fluid collections. No inguinal mass or adenopathy. No abdominal wall hernia or subcutaneous lesions. Musculoskeletal: Left convex lumbar scoliosis and associated advanced degenerative lumbar spondylosis with multilevel disc disease and facet disease. No acute bony findings. IMPRESSION: 1. No acute abdominal/pelvic findings, mass lesions or adenopathy. No significant stool  burden or bowel obstructive findings. 2. Moderate bladder wall thickening. Some of this could be due to lack of distension but could not exclude cystitis. Recommend correlation with urinalysis. 3. Left convex lumbar scoliosis and associated advanced degenerative lumbar spondylosis with multilevel disc disease and facet disease. Electronically Signed   By: MYRTIS Stammer M.D.   On: 08/25/2024 16:12     Procedures   Medications Ordered in the ED  iohexol  (OMNIPAQUE ) 300 MG/ML solution 100 mL (100 mLs Intravenous Contrast Given 08/25/24 1531)  lactated ringers  bolus 500 mL (0 mLs Intravenous Stopped 08/25/24 2202)  dextrose  50 % solution 25 mL (25 mLs Intravenous Given 08/25/24 1932)                                    Medical Decision Making 58 year old male with past medical history of MR and Down syndrome presents emergency [name] today with abdominal pain and constipation.  I will further evaluate the patient here with basic labs including LFTs and lipase to eval for hepatobiliary pathology or pancreatitis.  Will obtain CT scan to eval for obstruction.  Will also obtain a urinalysis to eval for UTI.  I will reevaluate for ultimate disposition.  The patient's glucose is low here.  Will give him IV fluids here to help facilitate getting a urine sample here as well as IV dextrose  for the hyperglycemia.  The patient actually improved with his hyperglycemia with oral fluids.  Urinalysis does not show findings consistent with infection.  He remained stable here.  Does not have a fecal impaction on rectal exam.  Will start the patient on lactulose.  Also give the patient a prescription for Bentyl  to take prior to eating to see if this helps with symptoms.  He is encouraged to follow-up with his GI doctor and he was discharged with return precautions.  Amount and/or Complexity of Data Reviewed Labs: ordered.  Risk Prescription drug management.        Final diagnoses:  Constipation,  unspecified constipation type    ED Discharge Orders          Ordered    dicyclomine  (BENTYL ) 10 MG capsule  3 times daily before meals & bedtime        08/25/24 2029    Lactulose 20 GM/30ML SOLN  Daily PRN        08/25/24 2031               Ula Prentice SAUNDERS, MD 08/25/24 2227

## 2024-08-25 NOTE — Discharge Instructions (Signed)
 Your workup today was reassuring.  Please try the Bentyl  4 times daily and tried to have him take this 30 minutes prior to eating.  Try the lactulose for the constipation.  Please call and schedule a follow-up appointment with his GI doctor.  Return to the ER for worsening symptoms.

## 2024-08-25 NOTE — ED Triage Notes (Signed)
 Patient has chronic abdominal pain. Family said any time patient eats anything he has chronic constipation and abdominal pain right behind his belly button. No bowel movement for over 1 week. No vomiting.

## 2024-10-21 ENCOUNTER — Telehealth: Payer: Self-pay

## 2024-10-21 ENCOUNTER — Encounter: Payer: Self-pay | Admitting: Family Medicine

## 2024-10-21 NOTE — Telephone Encounter (Signed)
 Called patients dad to get a little more information on when patient would need to report to jury duty. Left vm to return call.  Would we be able to provide patient with letter getting his out of jury duty   Copied from CRM #[id number]. Topic: Clinical - Medical Advice >> Oct 20, 2024  4:57 PM Nessti S wrote: Reason for CRM: pt dad called because he received a letter from jury duty asking that he provides a printed letter stating pt disability and that he is incapable of joining as a juror.

## 2024-10-21 NOTE — Telephone Encounter (Addendum)
 Letter sent to MyChart.  If assigned copy required I can complete next week once I return from out of town.

## 2024-10-22 NOTE — Telephone Encounter (Signed)
 Patient's father verbalized understanding.

## 2024-11-07 ENCOUNTER — Ambulatory Visit: Admitting: Family Medicine

## 2024-11-07 ENCOUNTER — Encounter: Payer: Self-pay | Admitting: Family Medicine

## 2024-11-07 VITALS — BP 124/84 | HR 51 | Temp 97.6°F | Resp 16 | Ht 61.0 in | Wt 151.4 lb

## 2024-11-07 DIAGNOSIS — J069 Acute upper respiratory infection, unspecified: Secondary | ICD-10-CM | POA: Diagnosis not present

## 2024-11-07 DIAGNOSIS — M545 Low back pain, unspecified: Secondary | ICD-10-CM

## 2024-11-07 NOTE — Progress Notes (Unsigned)
 "  ACUTE VISIT Chief Complaint  Patient presents with   Sinus Problem    Sore back x 1 week , sore throat and congestion x 3 days    HPI: [name] is a 59 y.o. male with past medical history significant for OSA and tachycardia-bradycardia syndrome, who is here today complaining of *** Discussed the use of AI scribe software for clinical note transcription with the patient, who gave verbal consent to proceed.  History of Present Illness Steven [name] is a 59 year old male who presents with sore back and sore throat with congestion.  He has been experiencing a sore back for the past week, localized to the lower back, and it becomes noticeable when standing up and sitting down, often causing groaning during these movements. No history of back pain or trauma. Ibuprofen has been used for pain relief, but no other medications have been taken for this issue.  For the past three days, he has had a sore throat and nasal congestion. No exposure to individuals with flu or COVID-19 and has not taken any over-the-counter medications for these symptoms. He has not been tested for COVID-19 or flu, as the available COVID test at home is over a year old. No increase in shortness of breath beyond his baseline, which includes shortness of breath upon exertion.  He has a history of chronic constipation, which has improved over the last couple of months, with more regular bowel movements. No changes in bowel habits or appetite associated with the current illness. No nausea, vomiting, or fever.    Review of Systems See other pertinent positives and negatives in HPI.  Medications Ordered Prior to Encounter[1]  Past Medical History:  Diagnosis Date   Complication of anesthesia    per patient father, patient was slow to wake with a previous surgery    Down syndrome    Incisional hernia    MR (mental retardation)    Sleep apnea    Allergies[2]  Social History   Socioeconomic History   Marital  status: Single    Spouse name: Not on file   Number of children: Not on file   Years of education: Not on file   Highest education level: Not on file  Occupational History   Not on file  Tobacco Use   Smoking status: Never   Smokeless tobacco: Never  Vaping Use   Vaping status: Never Used  Substance and Sexual Activity   Alcohol use: Not Currently    Comment: occ   Drug use: No   Sexual activity: Not on file  Other Topics Concern   Not on file  Social History Narrative   Not on file   Social Drivers of Health   Tobacco Use: Low Risk (11/07/2024)   Patient History    Smoking Tobacco Use: Never    Smokeless Tobacco Use: Never    Passive Exposure: Not on file  Financial Resource Strain: Not on file  Food Insecurity: Not on file  Transportation Needs: Not on file  Physical Activity: Not on file  Stress: Not on file  Social Connections: Not on file  Depression (PHQ2-9): Low Risk (03/10/2024)   Depression (PHQ2-9)    PHQ-2 Score: 0  Alcohol Screen: Not on file  Housing: Not on file  Utilities: Not on file  Health Literacy: Not on file    Vitals:   11/07/24 1112  BP: 124/84  Resp: 16  Temp: 97.6 F (36.4 C)   Body mass index is 28.61  kg/m.  Physical Exam Vitals and nursing note reviewed.  Constitutional:      General: He is not in acute distress.    Appearance: He is well-developed. He is not ill-appearing.  HENT:     Head: Atraumatic.     Right Ear: External ear normal.     Left Ear: External ear normal.     Ears:     Comments: Excess cerumen, bilateral, not impacted.Could not see TM's.    Nose: Rhinorrhea present.     Right Sinus: No maxillary sinus tenderness or frontal sinus tenderness.     Left Sinus: No maxillary sinus tenderness or frontal sinus tenderness.  Eyes:     Conjunctiva/sclera: Conjunctivae normal.  Cardiovascular:     Rate and Rhythm: Regular rhythm. Bradycardia present.     Heart sounds: No murmur heard. Pulmonary:     Effort:  Pulmonary effort is normal. No respiratory distress.     Breath sounds: Normal breath sounds. No stridor.  Lymphadenopathy:     Head:     Right side of head: No submandibular adenopathy.     Left side of head: No submandibular adenopathy.     Cervical: No cervical adenopathy.  Skin:    General: Skin is warm.     Findings: No erythema or rash.  Neurological:     Mental Status: He is alert and oriented to person, place, and time.  Psychiatric:        Speech: Speech normal.        Behavior: Behavior is cooperative.     Comments: Well groomed, good eye contact.     ASSESSMENT AND PLAN:  There are no diagnoses linked to this encounter.  No orders of the defined types were placed in this encounter.   No problem-specific Assessment & Plan notes found for this encounter.    No follow-ups on file.  Verland Sprinkle G. Tandy Lewin, MD  [facility]. [place] [facility].     [1]  Current Outpatient Medications on File Prior to Visit  Medication Sig Dispense Refill   ondansetron  (ZOFRAN -ODT) 8 MG disintegrating tablet 8mg  ODT q8 hours prn nausea (Patient taking differently: 8mg  ODT q8 hours prn nausea  As needed) 4 tablet 0   alum & mag hydroxide-simeth (MAALOX MAX) 400-400-40 MG/5ML suspension Take 5 mLs by mouth every 6 (six) hours as needed for indigestion. (Patient not taking: Reported on 11/07/2024) 355 mL 0   amoxicillin -clavulanate (AUGMENTIN ) 875-125 MG tablet Take 1 tablet by mouth every 12 (twelve) hours. (Patient not taking: Reported on 11/07/2024) 14 tablet 0   Bacillus Coagulans-Inulin (ALIGN PREBIOTIC-PROBIOTIC) 5-1.25 MG-GM CHEW Chew 1 capsule by mouth daily. (Patient not taking: Reported on 11/07/2024) 30 tablet 0   Cholecalciferol (VITAMIN D  PO) Take 5,000 Units by mouth daily.  (Patient not taking: Reported on 11/07/2024)     dicyclomine  (BENTYL ) 10 MG capsule Take 1 capsule (10 mg total) by mouth in the morning and at bedtime. (Patient not taking: Reported on 11/07/2024) 60 capsule  0   dicyclomine  (BENTYL ) 10 MG capsule Take 1 capsule (10 mg total) by mouth 4 (four) times daily -  before meals and at bedtime. (Patient not taking: Reported on 11/07/2024) 20 capsule 0   famotidine  (PEPCID ) 20 MG tablet Take 1 tablet (20 mg total) by mouth 2 times daily. (Patient not taking: Reported on 11/07/2024) 60 tablet 0   fluticasone  (FLONASE ) 50 MCG/ACT nasal spray Place 1 spray into both nostrils daily. (Patient not taking: Reported on 11/07/2024) 16 g 2  Lactulose  20 GM/30ML SOLN Take 30 mLs (20 g total) by mouth daily as needed (Constipation). (Patient not taking: Reported on 11/07/2024) 450 mL 0   Multiple Vitamin (MULTIVITAMIN WITH MINERALS) TABS tablet Take 1 tablet by mouth daily. (Patient not taking: Reported on 11/07/2024)     Omega-3 Fatty Acids (OMEGA-3 CF PO) Take 1,600 mg by mouth daily. (Patient not taking: Reported on 11/07/2024)     omeprazole  (PRILOSEC) 20 MG capsule Take 1 capsule (20 mg total) by mouth daily. (Patient not taking: Reported on 11/07/2024) 30 capsule 3   ondansetron  (ZOFRAN -ODT) 4 MG disintegrating tablet Take 1 tablet (4 mg total) by mouth every 8 (eight) hours as needed. (Patient not taking: Reported on 11/07/2024) 20 tablet 0   tamsulosin (FLOMAX) 0.4 MG CAPS capsule Take 0.4 mg by mouth daily after breakfast. (Patient not taking: Reported on 11/07/2024)     triamcinolone  cream (KENALOG ) 0.1 % Apply 1 Application topically 2 (two) times daily. (Patient not taking: Reported on 11/07/2024) 30 g 0   No current facility-administered medications on file prior to visit.  [2] No Known Allergies  "

## 2024-11-07 NOTE — Patient Instructions (Addendum)
 A few things to remember from today's visit:  URI, acute  Acute low back pain without sciatica, unspecified back pain laterality  Monitor for fever or other symptom. Tylenol  500 mg 2-3 times per day for pain.  Do not use My Chart to request refills or for acute issues that need immediate attention. If you send a my chart message, it may take a few days to be addressed, specially if I am not in the office.  Please be sure medication list is accurate. If a new problem present, please set up appointment sooner than planned today.

## 2024-11-18 ENCOUNTER — Ambulatory Visit (INDEPENDENT_AMBULATORY_CARE_PROVIDER_SITE_OTHER): Admitting: Student in an Organized Health Care Education/Training Program

## 2024-11-18 ENCOUNTER — Encounter: Payer: Self-pay | Admitting: Student in an Organized Health Care Education/Training Program

## 2024-11-18 ENCOUNTER — Ambulatory Visit: Payer: Self-pay

## 2024-11-18 VITALS — BP 98/64 | HR 83 | Temp 98.0°F | Ht 61.0 in | Wt 152.0 lb

## 2024-11-18 DIAGNOSIS — K5909 Other constipation: Secondary | ICD-10-CM | POA: Diagnosis not present

## 2024-11-18 NOTE — Telephone Encounter (Signed)
 FYI appt today with Dr. Jerrell

## 2024-11-18 NOTE — Progress Notes (Signed)
 "  Acute Office Visit  Patient ID: Steven [name], male    DOB: 11-Dec-1965, 59 y.o.   MRN: [id number]  PCP: Levora Reyes SAUNDERS, MD  Chief Complaint  Patient presents with   Abdominal Pain    Started for over a week. Using miralax  once a day. States it is very periodic. Starts around 6-7pm ends around 9-10pm. Has been complaing about pain when swallowing and shortness of breath. No fever, no chills.     Subjective:     HPI  Discussed the use of AI scribe software for clinical note transcription with the patient, who gave verbal consent to proceed.  History of Present Illness Steven [name] is a 59 year old male with Down syndrome who presents with chronic abdominal pain and constipation. He is accompanied by his stepfather.  He experiences chronic abdominal pain located behind his navel, occurring daily, typically between 6 and 7 PM, lasting for two to three hours, and severe enough to cause him to bend over in pain. The pain is not related to food intake. Recently, he also experienced pain shortly after getting out of bed in the morning. Despite extensive evaluations including five CT scans, a colonoscopy in June, and an upper endoscopy in February, his caregivers report that no cause for his symptoms has been found.  He has a history of chronic constipation, previously having no bowel movements for extended periods. Currently, he has bowel movements once or twice a week. He has been on Miralax  and Citrucel, and previously tried lactulose , which resulted in one significant bowel movement. His last bowel movement was four days ago. No diarrhea.  He experiences pain during urination, although the duration of this symptom is unclear. He urinates frequently, especially after sitting or lying down, but there is a disconnect between feeling the need to urinate and actually doing so. He has been on Flomax in the past, but the reason for discontinuation is unclear.  His memory and physical activity  have been declining over the past year. He was an avid bowler but has had difficulty recognizing his bowling ball in the last four to five months. His caregivers note that it takes him longer to perform daily activities, such as putting on shoes and a coat.  He has been on a mostly gluten-free and dairy-free diet, with an emphasis on fruits and minimal junk food. He takes Miralax  once a day. No fevers or chills. He had a head cold last week.      Objective:    BP 98/64   Pulse 83   Temp 98 F (36.7 C) (Oral)   Ht 5' 1 (1.549 m)   Wt 152 lb (68.9 kg)   SpO2 97%   BMI 28.72 kg/m   Physical Exam  Gen: Well-appearing person with Down syndrome Heart: Irregular heartbeats, 2 out of 6 diastolic murmur best heard at the apex Lungs: Unlabored Abd: Soft, mildly tender to palpation over the left lower quadrant and left lower epigastrium.  Nontender elsewhere.  No rebound or guarding.  No organomegaly.  Tympanic to percussion diffusely.  Nondistended. Ext: Warm and no edema     Assessment & Plan:   Problem List Items Addressed This Visit       Unprioritized   Chronic constipation - Primary   Difficult situation of this 59 year old person with Down syndrome and declining memory/declining function with chronic constipation and worsening lower abdominal discomfort over the last few weeks.  This has been an ongoing issue over the last  1 year.  Last year Lonie had 5 CT scans for abdominal pain which were all structurally normal and showed chronic constipation.  He had a colonoscopy in June which was reassuring.  His current bowel regimen includes only 1 packet of MiraLAX  on a daily basis.  He is family does not remember him using any other medications, except the lactulose  in the past from the ED. his exam is reassuring today, there is no rebound or guarding to suggest peritonitis.  He is diffusely tympanic, so I think he is having discomfort from chronic constipation and gaseous distention.  He  has no urinary dysuria.  I think his current bowel regimen is inadequate.  I recommended that he increase the MiraLAX  to twice daily and add a stimulant laxative like senna on a daily or twice daily basis.  If that is not effective, I would recommend trying a intestinal secretagogue like Amitiza or Viberzi.  It looks like he tried Viberzi after the colonoscopy, but reported that it was not effective, family does not remember this trial currently.  I am a little worried about his functional status declining, we talked about the association of Alzheimer's dementia with Down syndrome.  His mother was recently diagnosed with Alzheimer's, so this has left his stepfather to care for both he and his mother at home, which may be a little overwhelming.  May benefit from social work consultation in the future.       Return in about 2 weeks (around 12/02/2024).  Cleatus Debby Specking, MD   [facility]   "

## 2024-11-18 NOTE — Patient Instructions (Signed)
" °  VISIT SUMMARY: During your visit, we discussed your chronic abdominal pain and constipation. We reviewed your history and current symptoms, including your diet and medications. We also talked about your recent memory and physical activity changes.  YOUR PLAN: -CHRONIC CONSTIPATION: Chronic constipation means having infrequent or difficult bowel movements over a long period. This can cause abdominal pain and discomfort. We believe your constipation may be related to cognitive decline, which is common in individuals with Down syndrome. To help manage this, we recommend increasing your Miralax  to twice daily and starting Senna once daily, increasing to twice daily if needed. You can use Tylenol  to help with the pain. We will follow up in 1-2 weeks to see how you are responding to these changes.  INSTRUCTIONS: Please follow the new medication plan and increase your Miralax  to twice daily. Start taking Senna once daily, and if needed, increase it to twice daily. Use Tylenol  for pain management. We will follow up in 1-2 weeks to assess your response to these changes.    Contains text generated by [employer].   "

## 2024-11-18 NOTE — Assessment & Plan Note (Signed)
 Difficult situation of this 59 year old person with Down syndrome and declining memory/declining function with chronic constipation and worsening lower abdominal discomfort over the last few weeks.  This has been an ongoing issue over the last 1 year.  Last year Steven [name] had 5 CT scans for abdominal pain which were all structurally normal and showed chronic constipation.  He had a colonoscopy in June which was reassuring.  His current bowel regimen includes only 1 packet of MiraLAX  on a daily basis.  He is family does not remember him using any other medications, except the lactulose  in the past from the ED. his exam is reassuring today, there is no rebound or guarding to suggest peritonitis.  He is diffusely tympanic, so I think he is having discomfort from chronic constipation and gaseous distention.  He has no urinary dysuria.  I think his current bowel regimen is inadequate.  I recommended that he increase the MiraLAX  to twice daily and add a stimulant laxative like senna on a daily or twice daily basis.  If that is not effective, I would recommend trying a intestinal secretagogue like Amitiza or Viberzi.  It looks like he tried Viberzi after the colonoscopy, but reported that it was not effective, family does not remember this trial currently.  I am a little worried about his functional status declining, we talked about the association of Alzheimer's dementia with Down syndrome.  His mother was recently diagnosed with Alzheimer's, so this has left his stepfather to care for both he and his mother at home, which may be a little overwhelming.  May benefit from social work consultation in the future.

## 2024-11-18 NOTE — Telephone Encounter (Signed)
 FYI Only or Action Required?: FYI only for provider: appointment scheduled on 11/18/24.  Patient was last seen in primary care on 11/07/2024 by Jordan, Betty G, MD.  Called Nurse Triage reporting Abdominal Pain.  Symptoms began a week ago.  Interventions attempted: Rest, hydration, or home remedies.  Symptoms are: gradually worsening.  Triage Disposition: See HCP Within 4 Hours (Or PCP Triage)  Patient/caregiver understands and will follow disposition?: Yes   Reason for Disposition  [1] MILD-MODERATE pain AND [2] constant AND [3] present > 2 hours  Answer Assessment - Initial Assessment Questions Spoke with patient's father, Wolm, who states that patient woke up around 8:30AM feeling this abdominal pain/cramping and it has not subsided. States that patient has been having ongoing issues with abdominal pain and experiencing this in the evenings for the last week. Also reports occasional sob/panting-mild. Office visit advised.   1. LOCATION: Where does it hurt?      Behind navel  2. RADIATION: Does the pain shoot anywhere else? (e.g., chest, back)     Unknown  3. ONSET: When did the pain begin? (Minutes, hours or days ago)      States that patient has been experiencing cramping in the evening for the last  week  4. SUDDEN: Gradual or sudden onset?     Gradual  5. PATTERN Does the pain come and go, or is it constant?     Pain was presenting in the evenings, but today patient woke up feeling this pain  6. SEVERITY: How bad is the pain?  (e.g., Scale 1-10; mild, moderate, or severe)     Moderate  7. RECURRENT SYMPTOM: Have you ever had this type of stomach pain before? If Yes, ask: When was the last time? and What happened that time?      Reports ongoing issues with stomach cramping and infrequent bowel movements  8. CAUSE: What do you think is causing the stomach pain? (e.g., gallstones, recent abdominal surgery)     Not sure  9. RELIEVING/AGGRAVATING  FACTORS: What makes it better or worse? (e.g., antacids, bending or twisting motion, bowel movement)     Unknown  10. OTHER SYMPTOMS: Do you have any other symptoms? (e.g., back pain, diarrhea, fever, urination pain, vomiting)       Denies any other symptoms  Protocols used: Abdominal Pain - Male-A-AH  Reason for Triage: pt father said pt exp abdominal cramping every evening and night and complaining about it for hours .

## 2024-11-20 ENCOUNTER — Telehealth: Payer: Self-pay | Admitting: Family Medicine

## 2024-11-20 ENCOUNTER — Telehealth: Admitting: Family Medicine

## 2024-11-20 ENCOUNTER — Encounter: Payer: Self-pay | Admitting: Family Medicine

## 2024-11-20 DIAGNOSIS — R2689 Other abnormalities of gait and mobility: Secondary | ICD-10-CM | POA: Diagnosis not present

## 2024-11-20 DIAGNOSIS — R413 Other amnesia: Secondary | ICD-10-CM

## 2024-11-20 DIAGNOSIS — R001 Bradycardia, unspecified: Secondary | ICD-10-CM | POA: Diagnosis not present

## 2024-11-20 DIAGNOSIS — Q909 Down syndrome, unspecified: Secondary | ICD-10-CM | POA: Diagnosis not present

## 2024-11-20 DIAGNOSIS — R0609 Other forms of dyspnea: Secondary | ICD-10-CM

## 2024-11-20 DIAGNOSIS — R634 Abnormal weight loss: Secondary | ICD-10-CM | POA: Insufficient documentation

## 2024-11-20 DIAGNOSIS — R152 Fecal urgency: Secondary | ICD-10-CM | POA: Insufficient documentation

## 2024-11-20 NOTE — Progress Notes (Signed)
 Virtual Visit via Video Note  I connected with Steven [name] on 11/20/24 at 10:27 AM by a video enabled telemedicine application and verified that I am speaking with the correct person using two identifiers.  Patient location: home - with father in law, mother, on [place].  Majority of visit on video, but some buffering issues, changed to telephone for portion of visit. My location: office - [place].    I discussed the limitations, risks, security and privacy concerns of performing an evaluation and management service by telephone and the availability of in person appointments. I also discussed with the patient that there may be a patient responsible charge related to this service. The patient expressed understanding and agreed to proceed, consent obtained  Chief complaint:  Chief Complaint  Patient presents with   Follow-up    Trying to get medicaid and needing psych evaluation referral. They [place] patients [place]     History of Present Illness: Steven [name] is a 58 y.o. male  Request for psychological evaluation. Father notes that Steven [name] is in decline physically and mentally. Physically is moving slower, trouble walking longer distances. Mentally, seems to be in a fog for a few hours in the morning or at night.  Walk appears to be in a shuffle, and getting out of breath with walking longer distances - past 6 months or so. No chest pain.no syncope. Shuffling gait has been creeping up past 6 months to a year. Trouble remembering where he hung up a coat, fixated on object or in a daze at times. Has been an avid bowler. Difficulty with recognize bowling ball. Gas pains every evening, then calms down to be able to sleep.  Hx of junctional bradycardia, with last OV in May 2025. Pacemaker has been discussed, ultimately decided to hold as concerns of him interfering with the device.  Parent has noticed that it is more difficult providing  care for him.   Family has discussed with [employer] - encouraged to evaluate for medicaid.   Working on [employer], and in doing so will need a interior and spatial designer.  History of Down syndrome. Agrress to SW eval to discuss options.   Chronic constipation Evaluated by my colleague 2 days ago.  Reassuring exam at that time, thought to have gaseous distention, chronic constipation as contributor to his discomfort.  Recommended MiraLAX  twice per day, and adding senna once per day up to twice daily dosing.  With 1 to 2 weeks planned follow-up.  I do see the concerns at that visit regarding functional status concerns, and home environment, care provided by stepfather.  Has been taking miralax  and senna BID, bowel movements are improved - once each day. Prior BM may be up to a week intervals. No change in gas pains at night.   Patient Active Problem List   Diagnosis Date Noted   Abnormal weight loss 11/20/2024   Fecal urgency 11/20/2024   Chronic constipation 11/18/2024   Tachycardia-bradycardia syndrome (HCC) 11/22/2022   Sinus pause 11/22/2022   Sinus arrest 11/22/2022   Sleep apnea 11/22/2022   Obesity (BMI 35.0-39.9 without comorbidity) 11/22/2022   Postural dizziness with presyncope 10/21/2022   Junctional bradycardia 10/21/2022   Incarcerated incisional hernia 07/17/2017   Past Medical History:  Diagnosis Date   Complication of anesthesia    per patient father, patient was slow to wake with a previous surgery    Down syndrome    Incisional hernia  MR (mental retardation)    Sleep apnea    Past Surgical History:  Procedure Laterality Date   BIOPSY  12/07/2023   Procedure: BIOPSY;  Surgeon: Rollin Dover, MD;  Location: [facility];  Service: Gastroenterology;;   COLONOSCOPY N/A 04/25/2024   Procedure: COLONOSCOPY;  Surgeon: Rollin Dover, MD;  Location: [facility];  Service: Gastroenterology;  Laterality: N/A;   ESOPHAGOGASTRODUODENOSCOPY (EGD) WITH PROPOFOL  N/A 12/07/2023    Procedure: ESOPHAGOGASTRODUODENOSCOPY (EGD) WITH PROPOFOL ;  Surgeon: Rollin Dover, MD;  Location: [facility];  Service: Gastroenterology;  Laterality: N/A;   HERNIA REPAIR     unaware of the year of , maybe 10 years ago    INCISIONAL HERNIA REPAIR N/A 07/17/2017   Procedure: LAPAROSCOPIC INCISIONAL HERNIA WITH MESH;  Surgeon: Vernetta Berg, MD;  Location: [facility];  Service: General;  Laterality: N/A;   INSERTION OF MESH N/A 07/17/2017   Procedure: INSERTION OF MESH;  Surgeon: Vernetta Berg, MD;  Location: [facility];  Service: General;  Laterality: N/A;   Allergies[1] Prior to Admission medications  Medication Sig Start Date End Date Taking? Authorizing Provider  alum & mag hydroxide-simeth (MAALOX MAX) 400-400-40 MG/5ML suspension Take 5 mLs by mouth every 6 (six) hours as needed for indigestion. 07/14/23  Yes Christopher Savannah, PA-C  dicyclomine  (BENTYL ) 10 MG capsule Take 1 capsule (10 mg total) by mouth in the morning and at bedtime. Patient taking differently: Take 10 mg by mouth in the morning and at bedtime. As needed 05/30/24  Yes Levora Reyes SAUNDERS, MD  Cholecalciferol (VITAMIN D  PO) Take 5,000 Units by mouth daily.  Patient not taking: Reported on 11/20/2024    [provider]  dicyclomine  (BENTYL ) 10 MG capsule Take 1 capsule (10 mg total) by mouth 4 (four) times daily -  before meals and at bedtime. Patient not taking: Reported on 11/20/2024 08/25/24   Ula Prentice SAUNDERS, MD  famotidine  (PEPCID ) 20 MG tablet Take 1 tablet (20 mg total) by mouth 2 times daily. Patient not taking: Reported on 11/20/2024 10/17/23   Levora Reyes SAUNDERS, MD  fluticasone  (FLONASE ) 50 MCG/ACT nasal spray Place 1 spray into both nostrils daily. Patient not taking: Reported on 11/20/2024 09/10/23   Levora Reyes SAUNDERS, MD  Lactulose  20 GM/30ML SOLN Take 30 mLs (20 g total) by mouth daily as needed (Constipation). Patient not taking: Reported on 11/20/2024 08/25/24   Ula Prentice SAUNDERS, MD  Multiple Vitamin (MULTIVITAMIN  WITH MINERALS) TABS tablet Take 1 tablet by mouth daily. Patient not taking: Reported on 11/20/2024    [provider]  Omega-3 Fatty Acids (OMEGA-3 CF PO) Take 1,600 mg by mouth daily. Patient not taking: Reported on 11/20/2024    [provider]  omeprazole  (PRILOSEC) 20 MG capsule Take 1 capsule (20 mg total) by mouth daily. Patient not taking: Reported on 11/20/2024 08/27/23   Levora Reyes SAUNDERS, MD  ondansetron  (ZOFRAN -ODT) 4 MG disintegrating tablet Take 1 tablet (4 mg total) by mouth every 8 (eight) hours as needed. Patient not taking: Reported on 11/20/2024 01/28/24   Dean Clarity, MD  ondansetron  (ZOFRAN -ODT) 8 MG disintegrating tablet 8mg  ODT q8 hours prn nausea Patient not taking: Reported on 11/20/2024 03/04/24   Palumbo, April, MD  tamsulosin (FLOMAX) 0.4 MG CAPS capsule Take 0.4 mg by mouth daily after breakfast. Patient not taking: Reported on 11/20/2024    [provider]  triamcinolone  cream (KENALOG ) 0.1 % Apply 1 Application topically 2 (two) times daily. Patient not taking: Reported on 11/20/2024 04/20/23   Levora Reyes SAUNDERS,  MD   Social History   Socioeconomic History   Marital status: Single    Spouse name: Not on file   Number of children: Not on file   Years of education: Not on file   Highest education level: Not on file  Occupational History   Not on file  Tobacco Use   Smoking status: Never   Smokeless tobacco: Never  Vaping Use   Vaping status: Never Used  Substance and Sexual Activity   Alcohol use: Not Currently    Comment: occ   Drug use: No   Sexual activity: Not on file  Other Topics Concern   Not on file  Social History Narrative   Not on file   Social Drivers of Health   Tobacco Use: Low Risk (11/20/2024)   Patient History    Smoking Tobacco Use: Never    Smokeless Tobacco Use: Never    Passive Exposure: Not on file  Financial Resource Strain: Not on file  Food Insecurity: Not on file  Transportation Needs: Not on  file  Physical Activity: Not on file  Stress: Not on file  Social Connections: Not on file  Intimate Partner Violence: Not on file  Depression (PHQ2-9): Low Risk (03/10/2024)   Depression (PHQ2-9)    PHQ-2 Score: 0  Alcohol Screen: Not on file  Housing: Not on file  Utilities: Not on file  Health Literacy: Not on file    Observations/Objective: There were no vitals filed for this visit. Nontoxic appearance on video.  Appropriate in responses when directly discussed, majority of history provided by family.  No apparent distress.  Assessment and Plan: Shuffling gait - Plan: Ambulatory referral to Psychology, Ambulatory referral to Neurology, AMB Referral [facility] Care Management Memory change - Plan: Ambulatory referral to Psychology, Ambulatory referral to Neurology, AMB Referral [facility]BCI Care Management Down syndrome - Plan: Ambulatory referral to Psychology, Ambulatory referral to Neurology, AMB Referral [facility] Care Management  - History of Down syndrome and plan for psychology eval to assist in Medicaid benefits.  However am also concerned about some of the memory changes, reported shuffling gait, change in activity.  Discussed somewhat at recent visit with other physician and concern for Alzheimer's given association with Down syndrome.  However with shuffling gait and memory change I am concerned of possible parkinsonism.  Will refer to neurology in addition to psychology.  Will also refer to care management to evaluate for other options, social work eval.  Junctional bradycardia - Plan: Ambulatory referral to Cardiology, AMB Referral [facility]I Care Management DOE (dyspnea on exertion) - Plan: Ambulatory referral to Cardiology  - Known history of junctional bradycardia, and ultimately decided against pacemaker.  Family does report some dyspnea on exertion as above which could be related.  Recommended follow-up with cardiology for updated exam, and discussion of treatments with ER/RTC precautions if any  acute changes.  Understanding expressed.  Will follow-up in 2 weeks in person to evaluate his gait, memory and follow-up on constipation further which as above appears to be improving with use of MiraLAX .  Follow Up Instructions: As above, 2-week in person follow-up.  There are no Patient Instructions on file for this visit.  I discussed the assessment and treatment plan with the patient. The patient was provided an opportunity to ask questions and all were answered. The patient agreed with the plan and demonstrated an understanding of the instructions.   The patient was advised to call back or seek an in-person evaluation if the symptoms worsen or if  the condition fails to improve as anticipated.   [name], MD     [1] No Known Allergies

## 2024-11-20 NOTE — Progress Notes (Signed)
 Complex Care Management Note  Care Guide Note 11/20/2024 Name: Zakhari Fogel MRN: [id number] DOB: 1966/08/02  Russell Quinney is a 59 y.o. year old male who sees Levora Reyes SAUNDERS, MD for primary care. I reached out to Shon Hanly by phone today to offer complex care management services.  Mr. Shen was given information about Complex Care Management services today including:   The Complex Care Management services include support from the care team which includes your Nurse Care Manager, Clinical Social Worker, or Pharmacist.  The Complex Care Management team is here to help remove barriers to the health concerns and goals most important to you. Complex Care Management services are voluntary, and the patient may decline or stop services at any time by request to their care team member.   Complex Care Management Consent Status: Patient agreed to services and verbal consent obtained.   Follow up plan:  Telephone appointment with complex care management team member scheduled for:  12/10/2024  Encounter Outcome:  Patient Scheduled

## 2024-12-02 ENCOUNTER — Ambulatory Visit: Admitting: Student in an Organized Health Care Education/Training Program

## 2024-12-04 ENCOUNTER — Encounter: Payer: Self-pay | Admitting: Family Medicine

## 2024-12-04 ENCOUNTER — Ambulatory Visit: Admitting: Family Medicine

## 2024-12-04 VITALS — BP 114/66 | HR 85 | Temp 98.1°F | Resp 16 | Ht 61.0 in | Wt 158.0 lb

## 2024-12-04 DIAGNOSIS — R0609 Other forms of dyspnea: Secondary | ICD-10-CM

## 2024-12-04 DIAGNOSIS — K5909 Other constipation: Secondary | ICD-10-CM

## 2024-12-04 DIAGNOSIS — R413 Other amnesia: Secondary | ICD-10-CM

## 2024-12-04 DIAGNOSIS — Q909 Down syndrome, unspecified: Secondary | ICD-10-CM

## 2024-12-04 DIAGNOSIS — R2689 Other abnormalities of gait and mobility: Secondary | ICD-10-CM

## 2024-12-04 DIAGNOSIS — R001 Bradycardia, unspecified: Secondary | ICD-10-CM

## 2024-12-04 NOTE — Progress Notes (Unsigned)
 "  Subjective:  Patient ID: Steven [name], male    DOB: 07/31/1966  Age: 59 y.o. MRN: [id number]  CC:  Chief Complaint  Patient presents with   Follow-up    On constipation. Gotten better. Saw Dr. Jerrell 2 weeks ago and new regimen has worked. Saw gastro monday he believes and they are happy everything is improving.     HPI Steven [name] presents for  Follow-up of multiple concerns from telemedicine visit January 22.  Constipation Previously discussed with Dr. Jerrell.  Gaseous distention, recurrent constipation thought to be contributing to his intermittent abdominal discomfort.  MiraLAX  was increased to twice per day with adding senna for bowel regimen.  Bowel movement regimen was improved on January 22. Has continued to improve, saw gastroenterology yesterday.  As current regimen was working plan for follow-up as needed unless he developed diarrhea or becomes ineffective. Less frequent gas pains.   Shuffling gait, memory concerns Also discussed at his January 22 visit.  Initially at that time we were discussing a referral for a psychological evaluation.  His caregiver had noted that [name] was declining physically, mentally with moving slower, trouble walking longer distances, seem to be in a fog a few times in the morning or at night, sometimes forgetting where he had up his coat.  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  Family had discussed options with [employer], and plan to pursue Medicaid coverage for further assistance options.  In doing so he did need a psychology evaluation.  There was a possible concern for Alzheimer's disease given possible association with his Down syndrome.  However with the shuffling gait, memory change and gastrointestinal symptoms, parkinsonism/Parkinson's disease also in differential.  I referred him to neurology and psychology as well as care management to evaluate for other assistance options and social work  evaluation. I do see a call from care management on January 22.  Planned telephone appointment with complex care management team scheduled for 12/10/2024, with team including nurse care manager, clinical social worker or pharmacist.   11th appointment with social work. Received call today form [facility] - next available appt in June.  Referral to [facility] for neuropsych eval. Has not received call.   Shuffling gait, change in gait past few months, trouble with taking next step. Has to be reminded to go to bathroom at times.   Sometimes clear to talk to him, other times in a fog.  Prior to 4 months ago - would bowl regularly, independent with equipment and getting ready to bowl. Now requiring assistance with next steps, finding ball, etc.   Junctional bradycardia Previously evaluated by cardiology.  Pacemaker had been discussed but ultimately decided against pacemaker yet family did report some dyspnea on exertion recently.  I did place a repeat referral to cardiology for updated exam and evaluation.  Appointment noted on March 17 with cardiology. Has been noted to be out of breath quickly or with waking up at times- past few months, no recent acute changes.    History Patient Active Problem List   Diagnosis Date Noted   Abnormal weight loss 11/20/2024   Fecal urgency 11/20/2024   Chronic constipation 11/18/2024   Tachycardia-bradycardia syndrome (HCC) 11/22/2022   Sinus pause 11/22/2022   Sinus arrest 11/22/2022   Sleep apnea 11/22/2022   Obesity (BMI 35.0-39.9 without comorbidity) 11/22/2022   Postural dizziness with presyncope 10/21/2022   Junctional bradycardia 10/21/2022   Incarcerated incisional hernia 07/17/2017  Past Medical History:  Diagnosis Date   Complication of anesthesia    per patient father, patient was slow to wake with a previous surgery    Down syndrome    Incisional hernia    MR (mental retardation)    Sleep apnea    Past Surgical History:   Procedure Laterality Date   BIOPSY  12/07/2023   Procedure: BIOPSY;  Surgeon: Rollin Dover, MD;  Location: [facility];  Service: Gastroenterology;;   COLONOSCOPY N/A 04/25/2024   Procedure: COLONOSCOPY;  Surgeon: Rollin Dover, MD;  Location: [facility];  Service: Gastroenterology;  Laterality: N/A;   ESOPHAGOGASTRODUODENOSCOPY (EGD) WITH PROPOFOL  N/A 12/07/2023   Procedure: ESOPHAGOGASTRODUODENOSCOPY (EGD) WITH PROPOFOL ;  Surgeon: Rollin Dover, MD;  Location: [facility];  Service: Gastroenterology;  Laterality: N/A;   HERNIA REPAIR     unaware of the year of , maybe 10 years ago    INCISIONAL HERNIA REPAIR N/A 07/17/2017   Procedure: LAPAROSCOPIC INCISIONAL HERNIA WITH MESH;  Surgeon: Vernetta Berg, MD;  Location: [facility];  Service: General;  Laterality: N/A;   INSERTION OF MESH N/A 07/17/2017   Procedure: INSERTION OF MESH;  Surgeon: Vernetta Berg, MD;  Location: [facility];  Service: General;  Laterality: N/A;   Allergies[1] Prior to Admission medications  Medication Sig Start Date End Date Taking? Authorizing Provider  Lactulose  20 GM/30ML SOLN Take 30 mLs (20 g total) by mouth daily as needed (Constipation). 08/25/24  Yes Ula Prentice SAUNDERS, MD  alum & mag hydroxide-simeth (MAALOX MAX) 400-400-40 MG/5ML suspension Take 5 mLs by mouth every 6 (six) hours as needed for indigestion. Patient not taking: Reported on 12/04/2024 07/14/23   Christopher Savannah, PA-C  Cholecalciferol (VITAMIN D  PO) Take 5,000 Units by mouth daily.  Patient not taking: Reported on 12/04/2024    [provider]  dicyclomine  (BENTYL ) 10 MG capsule Take 1 capsule (10 mg total) by mouth in the morning and at bedtime. Patient not taking: Reported on 12/04/2024 05/30/24   Levora Reyes SAUNDERS, MD  dicyclomine  (BENTYL ) 10 MG capsule Take 1 capsule (10 mg total) by mouth 4 (four) times daily -  before meals and at bedtime. Patient not taking: Reported on 12/04/2024 08/25/24   Ula Prentice SAUNDERS, MD  famotidine  (PEPCID ) 20 MG tablet Take  1 tablet (20 mg total) by mouth 2 times daily. Patient not taking: Reported on 12/04/2024 10/17/23   Levora Reyes SAUNDERS, MD  fluticasone  (FLONASE ) 50 MCG/ACT nasal spray Place 1 spray into both nostrils daily. Patient not taking: Reported on 12/04/2024 09/10/23   Levora Reyes SAUNDERS, MD  Multiple Vitamin (MULTIVITAMIN WITH MINERALS) TABS tablet Take 1 tablet by mouth daily. Patient not taking: Reported on 12/04/2024    [provider]  Omega-3 Fatty Acids (OMEGA-3 CF PO) Take 1,600 mg by mouth daily. Patient not taking: Reported on 12/04/2024    [provider]  omeprazole  (PRILOSEC) 20 MG capsule Take 1 capsule (20 mg total) by mouth daily. Patient not taking: Reported on 12/04/2024 08/27/23   Levora Reyes SAUNDERS, MD  ondansetron  (ZOFRAN -ODT) 4 MG disintegrating tablet Take 1 tablet (4 mg total) by mouth every 8 (eight) hours as needed. Patient not taking: Reported on 12/04/2024 01/28/24   Dean Clarity, MD  ondansetron  (ZOFRAN -ODT) 8 MG disintegrating tablet 8mg  ODT q8 hours prn nausea Patient not taking: Reported on 12/04/2024 03/04/24   Palumbo, April, MD  Sennosides (SENNA LAX PO) Take 2 tablets by mouth daily.    [provider]  tamsulosin (FLOMAX) 0.4 MG CAPS capsule  Take 0.4 mg by mouth daily after breakfast. Patient not taking: Reported on 12/04/2024    [provider]  triamcinolone  cream (KENALOG ) 0.1 % Apply 1 Application topically 2 (two) times daily. Patient not taking: Reported on 12/04/2024 04/20/23   Levora Reyes SAUNDERS, MD   Social History   Socioeconomic History   Marital status: Single    Spouse name: Not on file   Number of children: Not on file   Years of education: Not on file   Highest education level: Not on file  Occupational History   Not on file  Tobacco Use   Smoking status: Never   Smokeless tobacco: Never  Vaping Use   Vaping status: Never Used  Substance and Sexual Activity   Alcohol use: Not Currently    Comment: occ   Drug use: No    Sexual activity: Not on file  Other Topics Concern   Not on file  Social History Narrative   Not on file   Social Drivers of Health   Tobacco Use: Low Risk (12/04/2024)   Patient History    Smoking Tobacco Use: Never    Smokeless Tobacco Use: Never    Passive Exposure: Not on file  Financial Resource Strain: Not on file  Food Insecurity: Not on file  Transportation Needs: Not on file  Physical Activity: Not on file  Stress: Not on file  Social Connections: Not on file  Intimate Partner Violence: Not on file  Depression (PHQ2-9): Low Risk (03/10/2024)   Depression (PHQ2-9)    PHQ-2 Score: 0  Alcohol Screen: Not on file  Housing: Not on file  Utilities: Not on file  Health Literacy: Not on file    Review of Systems Per HPI.   Objective:   Vitals:   12/04/24 1607  BP: 114/66  Pulse: 85  Resp: 16  Temp: 98.1 F (36.7 C)  TempSrc: Temporal  SpO2: 99%  Weight: 158 lb (71.7 kg)  Height: 5' 1 (1.549 m)     Physical Exam Vitals reviewed.  Constitutional:      Appearance: He is well-developed.  HENT:     Head: Normocephalic and atraumatic.  Neck:     Vascular: No carotid bruit or JVD.  Cardiovascular:     Rate and Rhythm: Normal rate and regular rhythm.     Heart sounds: Normal heart sounds. No murmur heard. Pulmonary:     Effort: Pulmonary effort is normal.     Breath sounds: Normal breath sounds. No rales.  Musculoskeletal:     Right lower leg: No edema.     Left lower leg: No edema.  Skin:    General: Skin is warm and dry.  Neurological:     Mental Status: He is alert and oriented to person, place, and time.  Psychiatric:        Mood and Affect: Mood normal.    I personally spent a total of *** minutes in the care of the patient today including {Time Based Coding:210964241}.  EKG-undetermined rhythm, PR interval not calculated.  QTc 510.  Compared to 03/28/2024, marked sinus bradycardia at that time, with junctional rhythm, A-V dissociation.   Similar-appearing couplets on current EKG.  No apparent significant ST or T wave changes from prior EKG, appears to be more bradycardic on current EKG.  Assessment & Plan:  Tomas Schamp is a 59 y.o. male . No diagnosis found.   No orders of the defined types were placed in this encounter.  There are no Patient Instructions  on file for this visit.    Signed,   Reyes Pines, MD [facility], [facility] 12/04/24 4:31 PM      [1] No Known Allergies  "

## 2024-12-04 NOTE — Patient Instructions (Signed)
" ° ° ° ° ° ° ° ° ° °  Thank you for coming in today.  As we discussed I am concerned about possible parkinsonism features versus alzheimers dementia. However, with the episodes of shortness of breath,  and the junctional bradycardia (heart arrhythmia) that may also be contributing to some of Steven [name]'s symptoms.  I would like to discuss his EKG further with cardiology, and we will try to reach out to another neurologist who is a movement specialist to see if he can be seen sooner.  I will contact you once I receive some additional information and if any blood work is needed.  I printed the number for the provider to complete a neuropsych eval, please contact them as I think that will be helpful as well.  I am glad to see that the complex care management team has reached out to you and the social worker should be able to provide some additional information regarding resources normally now but moving forward.  If any acute worsening of shortness of breath or new symptoms make sure he is seen right away.  Hang in there!                          "

## 2024-12-10 ENCOUNTER — Telehealth: Admitting: Licensed Clinical Social Worker

## 2024-12-12 ENCOUNTER — Ambulatory Visit: Admitting: Student

## 2024-12-17 ENCOUNTER — Telehealth: Admitting: Licensed Clinical Social Worker

## 2025-01-13 ENCOUNTER — Ambulatory Visit: Admitting: Physician Assistant

## 2025-04-14 ENCOUNTER — Ambulatory Visit: Admitting: Neurology
